# Patient Record
Sex: Female | Born: 1995 | Race: White | Hispanic: No | Marital: Married | Smoking: Former smoker
Health system: Southern US, Community
[De-identification: ages and names within clinical notes are randomized; demographics above are authoritative.]

## PROBLEM LIST (undated history)

## (undated) ENCOUNTER — Inpatient Hospital Stay: Payer: Self-pay

## (undated) DIAGNOSIS — K219 Gastro-esophageal reflux disease without esophagitis: Secondary | ICD-10-CM

## (undated) DIAGNOSIS — F32A Depression, unspecified: Secondary | ICD-10-CM

## (undated) DIAGNOSIS — K589 Irritable bowel syndrome without diarrhea: Secondary | ICD-10-CM

## (undated) DIAGNOSIS — E079 Disorder of thyroid, unspecified: Secondary | ICD-10-CM

## (undated) DIAGNOSIS — O133 Gestational [pregnancy-induced] hypertension without significant proteinuria, third trimester: Secondary | ICD-10-CM

## (undated) DIAGNOSIS — F329 Major depressive disorder, single episode, unspecified: Secondary | ICD-10-CM

## (undated) DIAGNOSIS — E0590 Thyrotoxicosis, unspecified without thyrotoxic crisis or storm: Secondary | ICD-10-CM

## (undated) DIAGNOSIS — F419 Anxiety disorder, unspecified: Secondary | ICD-10-CM

## (undated) HISTORY — DX: Gastro-esophageal reflux disease without esophagitis: K21.9

## (undated) HISTORY — PX: NO PAST SURGERIES: SHX2092

## (undated) HISTORY — DX: Gestational (pregnancy-induced) hypertension without significant proteinuria, third trimester: O13.3

## (undated) HISTORY — DX: Irritable bowel syndrome, unspecified: K58.9

## (undated) HISTORY — DX: Thyrotoxicosis, unspecified without thyrotoxic crisis or storm: E05.90

---
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
## 2015-01-02 ENCOUNTER — Observation Stay
Admission: EM | Admit: 2015-01-02 | Discharge: 2015-01-02 | Disposition: A | Discharge status: Home / Self Care | Payer: Commercial Managed Care - PPO | Attending: Obstetrics and Gynecology | Admitting: Obstetrics and Gynecology

## 2015-01-02 ENCOUNTER — Encounter: Payer: Self-pay | Admitting: *Deleted

## 2015-01-02 DIAGNOSIS — N939 Abnormal uterine and vaginal bleeding, unspecified: Secondary | ICD-10-CM | POA: Diagnosis present

## 2015-01-02 DIAGNOSIS — Z3A33 33 weeks gestation of pregnancy: Secondary | ICD-10-CM | POA: Diagnosis not present

## 2015-01-02 DIAGNOSIS — O468X3 Other antepartum hemorrhage, third trimester: Principal | ICD-10-CM | POA: Insufficient documentation

## 2015-01-02 NOTE — OB Triage Note (Signed)
AVS discharge instructions and labor/pregnancy precautions given to patient with no questions or concerns.   Debra Hinton  

## 2015-02-10 ENCOUNTER — Inpatient Hospital Stay: Payer: Commercial Managed Care - PPO | Admitting: Anesthesiology

## 2015-02-10 ENCOUNTER — Encounter: Payer: Self-pay | Admitting: Certified Nurse Midwife

## 2015-02-10 ENCOUNTER — Inpatient Hospital Stay
Admission: EM | Admit: 2015-02-10 | Discharge: 2015-02-12 | DRG: 982 | Disposition: A | Discharge status: Home / Self Care | Payer: Commercial Managed Care - PPO | Attending: Certified Nurse Midwife | Admitting: Certified Nurse Midwife

## 2015-02-10 DIAGNOSIS — Z6833 Body mass index (BMI) 33.0-33.9, adult: Secondary | ICD-10-CM | POA: Diagnosis not present

## 2015-02-10 DIAGNOSIS — E669 Obesity, unspecified: Secondary | ICD-10-CM | POA: Diagnosis present

## 2015-02-10 DIAGNOSIS — Z87891 Personal history of nicotine dependence: Secondary | ICD-10-CM

## 2015-02-10 DIAGNOSIS — K219 Gastro-esophageal reflux disease without esophagitis: Secondary | ICD-10-CM | POA: Diagnosis present

## 2015-02-10 DIAGNOSIS — O4292 Full-term premature rupture of membranes, unspecified as to length of time between rupture and onset of labor: Secondary | ICD-10-CM | POA: Diagnosis present

## 2015-02-10 DIAGNOSIS — O4290 Premature rupture of membranes, unspecified as to length of time between rupture and onset of labor, unspecified weeks of gestation: Secondary | ICD-10-CM | POA: Diagnosis present

## 2015-02-10 DIAGNOSIS — O99344 Other mental disorders complicating childbirth: Secondary | ICD-10-CM | POA: Diagnosis present

## 2015-02-10 DIAGNOSIS — F419 Anxiety disorder, unspecified: Secondary | ICD-10-CM | POA: Diagnosis present

## 2015-02-10 DIAGNOSIS — F329 Major depressive disorder, single episode, unspecified: Secondary | ICD-10-CM | POA: Diagnosis present

## 2015-02-10 DIAGNOSIS — O9962 Diseases of the digestive system complicating childbirth: Secondary | ICD-10-CM | POA: Diagnosis present

## 2015-02-10 DIAGNOSIS — F1290 Cannabis use, unspecified, uncomplicated: Secondary | ICD-10-CM | POA: Diagnosis present

## 2015-02-10 DIAGNOSIS — Z3A39 39 weeks gestation of pregnancy: Secondary | ICD-10-CM | POA: Diagnosis present

## 2015-02-10 DIAGNOSIS — O99214 Obesity complicating childbirth: Secondary | ICD-10-CM | POA: Diagnosis present

## 2015-02-10 DIAGNOSIS — O133 Gestational [pregnancy-induced] hypertension without significant proteinuria, third trimester: Secondary | ICD-10-CM | POA: Diagnosis present

## 2015-02-10 DIAGNOSIS — O99324 Drug use complicating childbirth: Secondary | ICD-10-CM | POA: Diagnosis present

## 2015-02-10 DIAGNOSIS — Z3490 Encounter for supervision of normal pregnancy, unspecified, unspecified trimester: Secondary | ICD-10-CM

## 2015-02-10 HISTORY — DX: Anxiety disorder, unspecified: F41.9

## 2015-02-10 HISTORY — DX: Major depressive disorder, single episode, unspecified: F32.9

## 2015-02-10 HISTORY — DX: Depression, unspecified: F32.A

## 2015-02-10 LAB — COMPREHENSIVE METABOLIC PANEL
ALBUMIN: 3 g/dL — AB (ref 3.5–5.0)
ALT: 10 U/L — ABNORMAL LOW (ref 14–54)
ANION GAP: 7 (ref 5–15)
AST: 17 U/L (ref 15–41)
Alkaline Phosphatase: 117 U/L (ref 38–126)
BILIRUBIN TOTAL: 0.6 mg/dL (ref 0.3–1.2)
BUN: 8 mg/dL (ref 6–20)
CO2: 21 mmol/L — ABNORMAL LOW (ref 22–32)
Calcium: 8.3 mg/dL — ABNORMAL LOW (ref 8.9–10.3)
Chloride: 109 mmol/L (ref 101–111)
Creatinine, Ser: 0.68 mg/dL (ref 0.44–1.00)
GFR calc Af Amer: >60 mL/min (ref >60)
GFR calc non Af Amer: >60 mL/min (ref >60)
GLUCOSE: 84 mg/dL (ref 65–99)
POTASSIUM: 3.7 mmol/L (ref 3.5–5.1)
SODIUM: 137 mmol/L (ref 135–145)
TOTAL PROTEIN: 6.2 g/dL — AB (ref 6.5–8.1)

## 2015-02-10 LAB — CBC
HCT: 36.9 % (ref 35.0–47.0)
HEMOGLOBIN: 12.1 g/dL (ref 12.0–16.0)
MCH: 28.1 pg (ref 26.0–34.0)
MCHC: 32.8 g/dL (ref 32.0–36.0)
MCV: 85.7 fL (ref 80.0–100.0)
Platelets: 220 10*3/uL (ref 150–440)
RBC: 4.3 MIL/uL (ref 3.80–5.20)
RDW: 13.6 % (ref 11.5–14.5)
WBC: 9.1 10*3/uL (ref 3.6–11.0)

## 2015-02-10 LAB — TYPE AND SCREEN
ABO/RH(D): O POS
ANTIBODY SCREEN: NEGATIVE

## 2015-02-10 LAB — URINE DRUG SCREEN, QUALITATIVE (ARMC ONLY)
Amphetamines, Ur Screen: NOT DETECTED
BARBITURATES, UR SCREEN: NOT DETECTED
Benzodiazepine, Ur Scrn: NOT DETECTED
CANNABINOID 50 NG, UR ~~LOC~~: NOT DETECTED
Cocaine Metabolite,Ur ~~LOC~~: NOT DETECTED
MDMA (ECSTASY) UR SCREEN: NOT DETECTED
Methadone Scn, Ur: NOT DETECTED
Opiate, Ur Screen: NOT DETECTED
Phencyclidine (PCP) Ur S: NOT DETECTED
TRICYCLIC, UR SCREEN: NOT DETECTED

## 2015-02-10 LAB — PROTEIN / CREATININE RATIO, URINE
Creatinine, Urine: 66 mg/dL
Protein Creatinine Ratio: 0.24 mg/mg{Cre} — ABNORMAL HIGH (ref 0.00–0.15)
Total Protein, Urine: 16 mg/dL

## 2015-02-10 LAB — CHLAMYDIA/NGC RT PCR (ARMC ONLY)
Chlamydia Tr: NOT DETECTED
N GONORRHOEAE: NOT DETECTED

## 2015-02-10 MED ORDER — BUTORPHANOL TARTRATE 1 MG/ML IJ SOLN
1.0000 mg | INTRAMUSCULAR | Status: DC | PRN
  Administered 2015-02-10: 2 mg via INTRAVENOUS
  Administered 2015-02-10: 1 mg via INTRAVENOUS
  Administered 2015-02-10 (×2): 2 mg via INTRAVENOUS
  Filled 2015-02-10 (×2): qty 2

## 2015-02-10 MED ORDER — OXYTOCIN BOLUS FROM INFUSION: 500.0000 mL | INTRAVENOUS | Status: DC

## 2015-02-10 MED ORDER — MISOPROSTOL 200 MCG PO TABS: 800.0000 ug | ORAL_TABLET | ORAL | Status: DC

## 2015-02-10 MED ORDER — ONDANSETRON HCL 4 MG/2ML IJ SOLN: 4.0000 mg | Freq: Four times a day (QID) | INTRAMUSCULAR | Status: DC | PRN

## 2015-02-10 MED ORDER — BUTORPHANOL TARTRATE 1 MG/ML IJ SOLN
INTRAMUSCULAR | Status: AC
  Administered 2015-02-10: 2 mg via INTRAVENOUS
  Filled 2015-02-10: qty 2

## 2015-02-10 MED ORDER — LACTATED RINGERS IV SOLN: 500.0000 mL | INTRAVENOUS | Status: DC | PRN

## 2015-02-10 MED ORDER — OXYTOCIN 40 UNITS IN LACTATED RINGERS INFUSION - SIMPLE MED
62.5000 mL/h | INTRAVENOUS | Status: DC
  Filled 2015-02-10: qty 1000

## 2015-02-10 MED ORDER — FENTANYL 2.5 MCG/ML W/ROPIVACAINE 0.2% IN NS 100 ML EPIDURAL INFUSION (ARMC-ANES)
EPIDURAL | Status: AC
  Administered 2015-02-11: 10 mL/h via EPIDURAL
  Filled 2015-02-10: qty 100

## 2015-02-10 MED ORDER — OXYTOCIN 40 UNITS IN LACTATED RINGERS INFUSION - SIMPLE MED
1.0000 m[IU]/min | INTRAVENOUS | Status: DC
  Administered 2015-02-10: 1 m[IU]/min via INTRAVENOUS

## 2015-02-10 MED ORDER — INFLUENZA VAC SPLIT QUAD 0.5 ML IM SUSY
0.5000 mL | PREFILLED_SYRINGE | INTRAMUSCULAR | Status: DC
  Filled 2015-02-10: qty 0.5

## 2015-02-10 MED ORDER — AMMONIA AROMATIC IN INHA: 0.3000 mL | Freq: Once | RESPIRATORY_TRACT | Status: DC | PRN

## 2015-02-10 MED ORDER — BUTORPHANOL TARTRATE 1 MG/ML IJ SOLN
INTRAMUSCULAR | Status: AC
  Administered 2015-02-10: 1 mg via INTRAVENOUS
  Filled 2015-02-10: qty 1

## 2015-02-10 MED ORDER — TERBUTALINE SULFATE 1 MG/ML IJ SOLN: 0.2500 mg | Freq: Once | INTRAMUSCULAR | Status: DC | PRN

## 2015-02-10 MED ORDER — LIDOCAINE HCL (PF) 1 % IJ SOLN
30.0000 mL | INTRAMUSCULAR | Status: DC | PRN
  Filled 2015-02-10: qty 30

## 2015-02-10 MED ORDER — LACTATED RINGERS IV SOLN
INTRAVENOUS | Status: DC
  Administered 2015-02-10: 15:00:00 via INTRAVENOUS

## 2015-02-10 NOTE — Progress Notes (Addendum)
S: Moaning with ctx pain. Stadol 3 hours ago helped to relieve pain but spaced out the contractions she was having. Contractions now becoming more frequent/ O: Filed Vitals:   02/10/15 1414 02/10/15 1429 02/10/15 1430 02/10/15 1624  BP: 131/79 120/85 120/85 128/79  Pulse: 67 75 75 73   Results for orders placed or performed during the hospital encounter of 02/10/15 (from the past 24 hour(s))  CBC     Status: None   Collection Time: 02/10/15  3:16 PM  Result Value Ref Range   WBC 9.1 3.6 - 11.0 K/uL   RBC 4.30 3.80 - 5.20 MIL/uL   Hemoglobin 12.1 12.0 - 16.0 g/dL   HCT 16.1 09.6 - 04.5 %   MCV 85.7 80.0 - 100.0 fL   MCH 28.1 26.0 - 34.0 pg   MCHC 32.8 32.0 - 36.0 g/dL   RDW 40.9 81.1 - 91.4 %   Platelets 220 150 - 440 K/uL  Type and screen     Status: None   Collection Time: 02/10/15  3:16 PM  Result Value Ref Range   ABO/RH(D) O POS    Antibody Screen NEG    Sample Expiration 02/13/2015   Comprehensive metabolic panel     Status: Abnormal   Collection Time: 02/10/15  3:16 PM  Result Value Ref Range   Sodium 137 135 - 145 mmol/L   Potassium 3.7 3.5 - 5.1 mmol/L   Chloride 109 101 - 111 mmol/L   CO2 21 (L) 22 - 32 mmol/L   Glucose, Bld 84 65 - 99 mg/dL   BUN 8 6 - 20 mg/dL   Creatinine, Ser 7.82 0.44 - 1.00 mg/dL   Calcium 8.3 (L) 8.9 - 10.3 mg/dL   Total Protein 6.2 (L) 6.5 - 8.1 g/dL   Albumin 3.0 (L) 3.5 - 5.0 g/dL   AST 17 15 - 41 U/L   ALT 10 (L) 14 - 54 U/L   Alkaline Phosphatase 117 38 - 126 U/L   Total Bilirubin 0.6 0.3 - 1.2 mg/dL   GFR calc non Af Amer >60 >60 mL/min   GFR calc Af Amer >60 >60 mL/min   Anion gap 7 5 - 15  Chlamydia/NGC rt PCR ([facility] only)     Status: None   Collection Time: 02/10/15  3:26 PM  Result Value Ref Range   Specimen source GC/Chlam URINE, RANDOM    Chlamydia Tr NOT DETECTED NOT DETECTED   N gonorrhoeae NOT DETECTED NOT DETECTED  Urine Drug Screen, Qualitative ([facility] only)     Status: None   Collection Time: 02/10/15  3:26 PM   Result Value Ref Range   Tricyclic, Ur Screen NONE DETECTED NONE DETECTED   Amphetamines, Ur Screen NONE DETECTED NONE DETECTED   MDMA (Ecstasy)Ur Screen NONE DETECTED NONE DETECTED   Cocaine Metabolite,Ur [place] NONE DETECTED NONE DETECTED   Opiate, Ur Screen NONE DETECTED NONE DETECTED   Phencyclidine (PCP) Ur S NONE DETECTED NONE DETECTED   Cannabinoid 50 Ng, Ur [place] NONE DETECTED NONE DETECTED   Barbiturates, Ur Screen NONE DETECTED NONE DETECTED   Benzodiazepine, Ur Scrn NONE DETECTED NONE DETECTED   Methadone Scn, Ur NONE DETECTED NONE DETECTED    Gen: NAD, Sleepy from Stadol. Shaky when helped OOB to BR.             FHT: 130s with accels to 150s, mod var and  no decelerations SVE:2/75%/-1 Not picking up ctxs with toco very well. IUPC inserted. Contractions q4-5 minutes with coupling, most  contractions less than 50 mm Hg.  A/P:  19 y.o. yo G1P0 at [redacted]w[redacted]d with PROM x 8 hours with inadequate labor-Will start Pitocin augmentation. Plan Stadol for pain until more actively progressing then epidural.  FWB: Reassuring Cat 1 tracing. EFW 7#  Some elevated blood pressures initially...normotensive especially after Stadol  PIH labs WNL. Will get pro/cr ratio also   Debra Hinton, Debra Hinton 6:12 PM

## 2015-02-10 NOTE — H&P (Signed)
OB History & Physical   History of Present Illness:  Chief Complaint:  My bag of water broke at 1005 this Am. Some back pain, but not many contractions HPI:  Debra Hinton is a 19 y.o. G1P0 female at [redacted]w[redacted]d dated by LMP and c/w a 9wk ultrasound  Her pregnancy has been complicated by marijuana use in pregnancy, a history of anxiety and depression (not on medications), and obesity (BMI=33) with only a net 33 weight gain with pregnancy..  She presents to L&D for evaluation of PROM.   Prenatal care site: Prenatal care at [facility].       Maternal Medical History:   Past Medical History  Diagnosis Date  . Anxiety   . Depressive disorder     Past Surgical History  Procedure Laterality Date  . No past surgeries      No Known Allergies  Prior to Admission medications   Medication Sig Start Date End Date Taking? Authorizing Provider  cyclobenzaprine (FLEXERIL) 10 MG tablet Take 10 mg by mouth 3 (three) times daily as needed for muscle spasms.    Historical Provider, MD  prenatal vitamin w/FE, FA (NATACHEW) 29-1 MG CHEW chewable tablet Chew 1 tablet by mouth daily at 12 noon.    Historical Provider, MD   Has not taken Flexeril in over a month.       Social History: She  reports that she has quit smoking. She has never used smokeless tobacco. She reports that she uses illicit drugs (Marijuana). She reports that she does not drink alcohol.  Family History: family history includes Breast cancer in an other family member.   Review of Systems: Negative x 10 systems reviewed except as noted in the HPI.      Physical Exam:  Vital Signs: 126/99, 134/85. Pulse 56 General: no acute distress.  HEENT: normocephalic, atraumatic Heart: regular rate & rhythm.  No murmurs Lungs: clear to auscultation bilaterally Abdomen: soft, gravid, non-tender;  EFW: 7# Pelvic:   External: Normal external female genitalia  Cervix: 1/70%/-1 per RN exam    ROM:+ Nitrazine of clear  fluid Extremities: non-tender, symmetric, tr edema bilaterally.  DTRs:+1 Neurologic: Alert & oriented x 3.    Pertinent Results:  Prenatal Labs: Blood type/Rh O positive  Antibody screen negative  Rubella Varicella Immune immune  RPR negative  HBsAg negative  HIV negative  GC negative  Chlamydia negative  Genetic screening Negative Panorama  1 hour GTT 97  3 hour GTT NA  GBS negative} on 8/29   Baseline FHR: 140s with accelerations to 180s  With moderate variability Toco: initially uterine irritability, then irregular, mild  Bedside Ultrasound: cephalic (OT)     Assessment:  Debra Hinton is a 19 y.o. G1P0 female at [redacted]w[redacted]d with PROM clear fluid  -mildly elevated blood pressures Plan:  1. Admit to Labor & Delivery    2. CBC, T&S, CMP regular diet for lunch then clears, IVF 3. GBS negative  4. Consents obtained. 5. Reevaluate contraction pattern after lunch and start either Cytotec po or Pitocin IV if not contracting well. 6. Can ambulate and monitor intermittently.   Farrel Conners  02/10/2015 12:40 PM

## 2015-02-11 ENCOUNTER — Encounter: Payer: Self-pay | Admitting: *Deleted

## 2015-02-11 LAB — RPR: RPR: NONREACTIVE

## 2015-02-11 LAB — ABO/RH: ABO/RH(D): O POS

## 2015-02-11 MED ORDER — FERROUS SULFATE 325 (65 FE) MG PO TABS
325.0000 mg | ORAL_TABLET | Freq: Every day | ORAL | Status: DC
  Administered 2015-02-12: 325 mg via ORAL
  Filled 2015-02-11: qty 1

## 2015-02-11 MED ORDER — DIPHENHYDRAMINE HCL 50 MG/ML IJ SOLN: 12.5000 mg | INTRAMUSCULAR | Status: DC | PRN

## 2015-02-11 MED ORDER — DIBUCAINE 1 % RE OINT: 1.0000 "application " | TOPICAL_OINTMENT | RECTAL | Status: DC | PRN

## 2015-02-11 MED ORDER — OXYTOCIN 40 UNITS IN LACTATED RINGERS INFUSION - SIMPLE MED: 62.5000 mL/h | INTRAVENOUS | Status: DC | PRN

## 2015-02-11 MED ORDER — NALOXONE HCL 1 MG/ML IJ SOLN: 1.0000 ug/kg/h | INTRAVENOUS | Status: DC | PRN

## 2015-02-11 MED ORDER — HYDROCODONE-ACETAMINOPHEN 5-325 MG PO TABS
1.0000 | ORAL_TABLET | ORAL | Status: DC | PRN
  Administered 2015-02-11 – 2015-02-12 (×5): 2 via ORAL
  Filled 2015-02-11: qty 2
  Filled 2015-02-11: qty 1
  Filled 2015-02-11 (×2): qty 2
  Filled 2015-02-11: qty 1

## 2015-02-11 MED ORDER — WITCH HAZEL-GLYCERIN EX PADS: 1.0000 "application " | MEDICATED_PAD | CUTANEOUS | Status: DC | PRN

## 2015-02-11 MED ORDER — LIDOCAINE-EPINEPHRINE (PF) 1.5 %-1:200000 IJ SOLN
INTRAMUSCULAR | Status: DC | PRN
  Administered 2015-02-11: 3 mL via PERINEURAL

## 2015-02-11 MED ORDER — PRENATAL MULTIVITAMIN CH
1.0000 | ORAL_TABLET | Freq: Every day | ORAL | Status: DC
  Administered 2015-02-12: 1 via ORAL
  Filled 2015-02-11: qty 1

## 2015-02-11 MED ORDER — LANOLIN HYDROUS EX OINT: TOPICAL_OINTMENT | CUTANEOUS | Status: DC | PRN

## 2015-02-11 MED ORDER — BUPIVACAINE HCL (PF) 0.25 % IJ SOLN
INTRAMUSCULAR | Status: DC | PRN
  Administered 2015-02-11: 5 mL via EPIDURAL

## 2015-02-11 MED ORDER — FENTANYL 2.5 MCG/ML W/ROPIVACAINE 0.2% IN NS 100 ML EPIDURAL INFUSION (ARMC-ANES): 10.0000 mL/h | EPIDURAL | Status: DC

## 2015-02-11 MED ORDER — ONDANSETRON HCL 4 MG PO TABS: 4.0000 mg | ORAL_TABLET | ORAL | Status: DC | PRN

## 2015-02-11 MED ORDER — DOCUSATE SODIUM 100 MG PO CAPS
100.0000 mg | ORAL_CAPSULE | Freq: Two times a day (BID) | ORAL | Status: DC
  Administered 2015-02-11 – 2015-02-12 (×2): 100 mg via ORAL
  Filled 2015-02-11 (×2): qty 1

## 2015-02-11 MED ORDER — SODIUM CHLORIDE 0.9 % IJ SOLN: 3.0000 mL | INTRAMUSCULAR | Status: DC | PRN

## 2015-02-11 MED ORDER — NALOXONE HCL 0.4 MG/ML IJ SOLN: 0.4000 mg | INTRAMUSCULAR | Status: DC | PRN

## 2015-02-11 MED ORDER — NALBUPHINE HCL 10 MG/ML IJ SOLN
5.0000 mg | Freq: Once | INTRAMUSCULAR | Status: DC | PRN
  Filled 2015-02-11: qty 0.5

## 2015-02-11 MED ORDER — LIDOCAINE HCL (PF) 1 % IJ SOLN
INTRAMUSCULAR | Status: DC | PRN
  Administered 2015-02-10: 1 mL via INTRADERMAL

## 2015-02-11 MED ORDER — ONDANSETRON HCL 4 MG/2ML IJ SOLN
4.0000 mg | Freq: Three times a day (TID) | INTRAMUSCULAR | Status: DC | PRN
  Administered 2015-02-11: 4 mg via INTRAVENOUS
  Filled 2015-02-11: qty 2

## 2015-02-11 MED ORDER — BENZOCAINE-MENTHOL 20-0.5 % EX AERO: 1.0000 "application " | INHALATION_SPRAY | CUTANEOUS | Status: DC | PRN

## 2015-02-11 MED ORDER — SIMETHICONE 80 MG PO CHEW: 80.0000 mg | CHEWABLE_TABLET | ORAL | Status: DC | PRN

## 2015-02-11 MED ORDER — ONDANSETRON HCL 4 MG/2ML IJ SOLN: 4.0000 mg | INTRAMUSCULAR | Status: DC | PRN

## 2015-02-11 MED ORDER — MEPERIDINE HCL 25 MG/ML IJ SOLN: 6.2500 mg | INTRAMUSCULAR | Status: DC | PRN

## 2015-02-11 MED ORDER — DIPHENHYDRAMINE HCL 25 MG PO CAPS: 25.0000 mg | ORAL_CAPSULE | ORAL | Status: DC | PRN

## 2015-02-11 MED ORDER — NALBUPHINE HCL 10 MG/ML IJ SOLN
5.0000 mg | INTRAMUSCULAR | Status: DC | PRN
  Filled 2015-02-11: qty 0.5

## 2015-02-11 MED ORDER — IBUPROFEN 600 MG PO TABS
600.0000 mg | ORAL_TABLET | Freq: Four times a day (QID) | ORAL | Status: DC | PRN
  Administered 2015-02-11 – 2015-02-12 (×3): 600 mg via ORAL
  Filled 2015-02-11 (×3): qty 1

## 2015-02-11 MED ORDER — HYDROCODONE-ACETAMINOPHEN 5-325 MG PO TABS
ORAL_TABLET | ORAL | Status: AC
  Filled 2015-02-11: qty 2

## 2015-02-11 MED ORDER — SCOPOLAMINE 1 MG/3DAYS TD PT72
1.0000 | MEDICATED_PATCH | Freq: Once | TRANSDERMAL | Status: DC
  Filled 2015-02-11: qty 1

## 2015-02-11 NOTE — Anesthesia Preprocedure Evaluation (Addendum)
Anesthesia Evaluation  Patient identified by MRN, date of birth, ID band Patient awake    Reviewed: Allergy & Precautions, H&P , NPO status , Patient's Chart, lab work & pertinent test results  History of Anesthesia Complications Negative for: history of anesthetic complications  Airway Mallampati: II  TM Distance: >3 FB Neck ROM: full    Dental no notable dental hx. (+) Teeth Intact   Pulmonary former smoker,    Pulmonary exam normal breath sounds clear to auscultation       Cardiovascular Exercise Tolerance: Good hypertension, (-) Past MI Normal cardiovascular exam Rhythm:regular Rate:Normal     Neuro/Psych negative neurological ROS  negative psych ROS   GI/Hepatic Neg liver ROS, GERD  Controlled,  Endo/Other  negative endocrine ROS  Renal/GU negative Renal ROS  negative genitourinary   Musculoskeletal   Abdominal   Peds  Hematology negative hematology ROS (+)   Anesthesia Other Findings Past Medical History:   Anxiety                                                      Depressive disorder                                          Reproductive/Obstetrics (+) Pregnancy                            Anesthesia Physical Anesthesia Plan  ASA: III  Anesthesia Plan: Epidural   Post-op Pain Management:    Induction:   Airway Management Planned:   Additional Equipment:   Intra-op Plan:   Post-operative Plan:   Informed Consent: I have reviewed the patients History and Physical, chart, labs and discussed the procedure including the risks, benefits and alternatives for the proposed anesthesia with the patient or authorized representative who has indicated his/her understanding and acceptance.   Dental Advisory Given  Plan Discussed with: Anesthesiologist, CRNA and Surgeon  Anesthesia Plan Comments:        Anesthesia Quick Evaluation

## 2015-02-11 NOTE — Discharge Summary (Signed)
Physician Obstetric Discharge Summary  Patient ID: Debra Hinton MRN: [id number] DOB/AGE: Oct 07, 1995 19 y.o.   Date of Admission: 02/10/2015  Date of Discharge: 02/12/2015  Admitting Diagnosis: Premature rupture of membrane at [redacted]w[redacted]d  Secondary Diagnosis: Gestational hypertension  Mode of Delivery: normal spontaneous vaginal delivery on 02/11/2015       Discharge Diagnosis: No other diagnosis   Intrapartum Procedures: pitocin augmentation and intrauterine pressure catheter and repair of vaginal and right periurethral lacerations   Post partum procedures: none  Complications: none   Brief Hospital Course  Debra Hinton is a G1P1001 who had a SVD on 02/11/2015;  for further details of this delivery, please refer to the delivery note.  Patient had an uncomplicated postpartum course.  By time of discharge on PPD#1, her pain was controlled on oral pain medications; she had appropriate lochia and was ambulating, voiding without difficulty and tolerating regular diet.  She was deemed stable for discharge to home.    Labs: CBC Latest Ref Rng 02/12/2015 02/10/2015 09/21/2014  WBC 3.6 - 11.0 K/uL 12.5(H) 9.1 9.3  Hemoglobin 12.0 - 16.0 g/dL 10.7(L) 12.1 14.2  Hematocrit 35.0 - 47.0 % 31.6(L) 36.9 41.6  Platelets 150 - 440 K/uL 164 220 211   O POS/ RI/ VI/ GBS negative  Physical exam:  Blood pressure 120/66, pulse 74, temperature 97.8 F (36.6 C), temperature source Oral, resp. rate 18, height 5' (1.524 m), weight 79.379 kg (175 lb), last menstrual period 05/13/2014, SpO2 100 %, unknown if currently breastfeeding. General: alert and no distress Lochia: appropriate Abdomen: soft, NT Uterine Fundus: firm Extremities: No evidence of DVT seen on physical exam. No lower extremity edema.  Discharge Instructions: Per After Visit Summary. Activity: Advance as tolerated. Pelvic rest for 6 weeks.  Also refer to Discharge Instructions Diet: Regular Medications:   Medication List    STOP  taking these medications        cyclobenzaprine 10 MG tablet  Commonly known as:  FLEXERIL      TAKE these medications        ferrous sulfate 325 (65 FE) MG tablet  Take 1 tablet (325 mg total) by mouth daily with breakfast.     ibuprofen 600 MG tablet  Commonly known as:  ADVIL,MOTRIN  Take 1 tablet (600 mg total) by mouth every 6 (six) hours as needed for mild pain, moderate pain or cramping.     norethindrone 0.35 MG tablet  Commonly known as:  MICRONOR,CAMILA,ERRIN  Take 1 tablet (0.35 mg total) by mouth daily.     prenatal vitamin w/FE, FA 29-1 MG Chew chewable tablet  Chew 1 tablet by mouth daily at 12 noon.       Outpatient follow up:  Follow-up Information    Follow up with GUTIERREZ, COLLEEN, CNM. Schedule an appointment as soon as possible for a visit in 6 weeks.   Specialty:  Certified Nurse Midwife   Why:  postpartum check   Contact information:   [address] [place] [address] [phone]      Postpartum contraception: oral progesterone-only contraceptive  Discharged Condition: good  Discharged to: home   Newborn Data: Disposition:home with mother  Apgars: APGAR (1 MIN): 7   APGAR (5 MINS): 9   APGAR (10 MINS):    Baby Feeding: Breast/ Debra Hinton, Debra Ou, MD 02/12/2015 9:14 AM

## 2015-02-11 NOTE — Anesthesia Procedure Notes (Addendum)
Epidural Patient location during procedure: OB Start time: 02/10/2015 11:55 PM End time: 02/10/2015 11:59 PM  Staffing Anesthesiologist: Margorie John K Performed by: anesthesiologist   Preanesthetic Checklist Completed: patient identified, site marked, surgical consent, pre-op evaluation, timeout performed, IV checked, risks and benefits discussed and monitors and equipment checked  Epidural Patient position: sitting Prep: Betadine Patient monitoring: heart rate, continuous pulse ox and blood pressure Approach: midline Location: L4-L5 Injection technique: LOR saline  Needle:  Needle type: Tuohy  Needle gauge: 18 G Needle length: 9 cm and 9 Needle insertion depth: 5 cm Catheter type: closed end flexible Catheter size: 20 Guage Catheter at skin depth: 10 cm Test dose: negative and 1.5% lidocaine with Epi 1:200 K  Assessment Sensory level: T10 Events: blood not aspirated, injection not painful, no injection resistance, negative IV test and no paresthesia  Additional Notes   Patient tolerated the insertion well without complications.Reason for block:procedure for pain

## 2015-02-11 NOTE — Progress Notes (Signed)
L&D note  S: Stadol helping pain, ready for epidural  O: 132/93, 111/83, 134/97 Protein/cr=240 mgm protein Cervix: 3/C/-1 FHR: 125-130 with moderate variability and accelerations to 150s Toco: contractions q1-2 min apart on 4 miu/min Pitocin, 200 mvus IUPC replaced (first one falling out)  A: progressing Cat 1 tracing Gestational hypertension  P: Consult anesthesia for epidural Continue to titrate pitocin to get/keep 200 mvu  GUTIERREZ, COLLEEN, CNM

## 2015-02-12 LAB — CBC
HEMATOCRIT: 31.6 % — AB (ref 35.0–47.0)
HEMOGLOBIN: 10.7 g/dL — AB (ref 12.0–16.0)
MCH: 29.3 pg (ref 26.0–34.0)
MCHC: 33.9 g/dL (ref 32.0–36.0)
MCV: 86.4 fL (ref 80.0–100.0)
Platelets: 164 10*3/uL (ref 150–440)
RBC: 3.65 MIL/uL — ABNORMAL LOW (ref 3.80–5.20)
RDW: 14.1 % (ref 11.5–14.5)
WBC: 12.5 10*3/uL — AB (ref 3.6–11.0)

## 2015-02-12 MED ORDER — FERROUS SULFATE 325 (65 FE) MG PO TABS: 325.0000 mg | ORAL_TABLET | Freq: Every day | ORAL | Status: DC

## 2015-02-12 MED ORDER — IBUPROFEN 600 MG PO TABS: 600.0000 mg | ORAL_TABLET | Freq: Four times a day (QID) | ORAL | Status: DC | PRN

## 2015-02-12 MED ORDER — NORETHINDRONE 0.35 MG PO TABS: 1.0000 | ORAL_TABLET | Freq: Every day | ORAL | Status: DC

## 2015-02-12 NOTE — Lactation Note (Signed)
This note was copied from the chart of Debra Laterria [place]. Lactation Consultation Note  Patient Name: Debra Hinton [id number] Date: 02/12/2015 Reason for consult: Initial assessment   Maternal Data    Feeding Feeding Type: Breast Fed  Yesterday, Mom declined LC assist saying baby was feeding well. Today she says she wanted a nipple shield yesterday due to latch issues. There is a little bruising on face of nipples, no cracks or bleeding. Baby's mouth, tongue, palate, WNL. Mom has flat nipples but compressible areola. I reviewed options after assisting with position and latch. Mom chose to try nipple shield. 24 mm worked best. Pecola Leisure did latch better with shield and we immediately heard a lot of swallows. Mom states this is the best feeding yet. I still need to see Mom apply shield herself and latch baby correctly on to breast before going home. Weaning off shield after a few days discussed. Encouraged ehr to Follow up with LC at MDs office or here within the the week.   LATCH Score/Interventions Latch: Grasps breast easily, tongue down, lips flanged, rhythmical sucking. (only wiht nipple shield)  Audible Swallowing: Spontaneous and intermittent  Type of Nipple: Flat Intervention(s): Hand pump  Comfort (Breast/Nipple): Filling, red/small blisters or bruises, mild/mod discomfort  Problem noted: Mild/Moderate discomfort Interventions (Mild/moderate discomfort): Comfort gels  Hold (Positioning): No assistance needed to correctly position infant at breast.  LATCH Score: 8  Lactation Tools Discussed/Used Nipple shield size: 24   Consult Status      Sunday Corn 02/12/2015, 12:18 PM

## 2015-02-12 NOTE — Discharge Instructions (Signed)

## 2015-02-12 NOTE — Progress Notes (Signed)
Discharge instructions reviewed with pt. Pt v/u of all instructions. Prescriptions given to pt. ID bands of mom and infant matched. Escorted by nursing via w/c in stable condition. Ruta Hinds, RN 02/12/15 [phone]

## 2015-02-13 NOTE — Anesthesia Postprocedure Evaluation (Signed)
  Anesthesia Post-op Note  Patient: Debra Hinton  Procedure(s) Performed: * No procedures listed *  Anesthesia type:No value filed.  Patient location: PACU  Post pain: Pain level controlled  Post assessment: Post-op Vital signs reviewed, Patient's Cardiovascular Status Stable, Respiratory Function Stable, Patent Airway and No signs of Nausea or vomiting  Post vital signs: Reviewed and stable  Last Vitals: There were no vitals filed for this visit.  Level of consciousness: awake, alert  and patient cooperative  Complications: No apparent anesthesia complications

## 2015-08-19 ENCOUNTER — Emergency Department
Admission: EM | Admit: 2015-08-19 | Discharge: 2015-08-20 | Disposition: A | Discharge status: Home / Self Care | Payer: Commercial Managed Care - PPO | Attending: Emergency Medicine | Admitting: Emergency Medicine

## 2015-08-19 ENCOUNTER — Encounter: Payer: Self-pay | Admitting: Emergency Medicine

## 2015-08-19 ENCOUNTER — Emergency Department: Payer: Commercial Managed Care - PPO

## 2015-08-19 DIAGNOSIS — R05 Cough: Secondary | ICD-10-CM | POA: Diagnosis not present

## 2015-08-19 DIAGNOSIS — Z79899 Other long term (current) drug therapy: Secondary | ICD-10-CM | POA: Diagnosis not present

## 2015-08-19 DIAGNOSIS — R109 Unspecified abdominal pain: Secondary | ICD-10-CM | POA: Diagnosis not present

## 2015-08-19 DIAGNOSIS — R079 Chest pain, unspecified: Secondary | ICD-10-CM | POA: Insufficient documentation

## 2015-08-19 DIAGNOSIS — Z87891 Personal history of nicotine dependence: Secondary | ICD-10-CM | POA: Diagnosis not present

## 2015-08-19 DIAGNOSIS — Z3202 Encounter for pregnancy test, result negative: Secondary | ICD-10-CM | POA: Insufficient documentation

## 2015-08-19 DIAGNOSIS — R197 Diarrhea, unspecified: Secondary | ICD-10-CM | POA: Diagnosis not present

## 2015-08-19 DIAGNOSIS — R1110 Vomiting, unspecified: Secondary | ICD-10-CM

## 2015-08-19 DIAGNOSIS — R059 Cough, unspecified: Secondary | ICD-10-CM

## 2015-08-19 LAB — URINALYSIS COMPLETE WITH MICROSCOPIC (ARMC ONLY)
Bacteria, UA: NONE SEEN
Bilirubin Urine: NEGATIVE
Glucose, UA: NEGATIVE mg/dL
Hgb urine dipstick: NEGATIVE
Ketones, ur: NEGATIVE mg/dL
Leukocytes, UA: NEGATIVE
Nitrite: NEGATIVE
Protein, ur: 30 mg/dL — AB
RBC / HPF: NONE SEEN RBC/hpf (ref 0–5)
Specific Gravity, Urine: 1.031 — ABNORMAL HIGH (ref 1.005–1.030)
pH: 5 (ref 5.0–8.0)

## 2015-08-19 LAB — COMPREHENSIVE METABOLIC PANEL
ALT: 22 U/L (ref 14–54)
ANION GAP: 10 (ref 5–15)
AST: 50 U/L — AB (ref 15–41)
Albumin: 4.2 g/dL (ref 3.5–5.0)
Alkaline Phosphatase: 78 U/L (ref 38–126)
BUN: 12 mg/dL (ref 6–20)
CHLORIDE: 101 mmol/L (ref 101–111)
CO2: 23 mmol/L (ref 22–32)
Calcium: 8.9 mg/dL (ref 8.9–10.3)
Creatinine, Ser: 0.86 mg/dL (ref 0.44–1.00)
Glucose, Bld: 108 mg/dL — ABNORMAL HIGH (ref 65–99)
POTASSIUM: 2.8 mmol/L — AB (ref 3.5–5.1)
Sodium: 134 mmol/L — ABNORMAL LOW (ref 135–145)
Total Bilirubin: 0.9 mg/dL (ref 0.3–1.2)
Total Protein: 7.5 g/dL (ref 6.5–8.1)

## 2015-08-19 LAB — CBC
HCT: 39.7 % (ref 35.0–47.0)
Hemoglobin: 13.3 g/dL (ref 12.0–16.0)
MCH: 25.5 pg — ABNORMAL LOW (ref 26.0–34.0)
MCHC: 33.6 g/dL (ref 32.0–36.0)
MCV: 75.9 fL — ABNORMAL LOW (ref 80.0–100.0)
Platelets: 209 K/uL (ref 150–440)
RBC: 5.23 MIL/uL — ABNORMAL HIGH (ref 3.80–5.20)
RDW: 17.2 % — ABNORMAL HIGH (ref 11.5–14.5)
WBC: 4.9 K/uL (ref 3.6–11.0)

## 2015-08-19 LAB — POCT PREGNANCY, URINE: Preg Test, Ur: NEGATIVE

## 2015-08-19 LAB — LIPASE, BLOOD: LIPASE: 15 U/L (ref 11–51)

## 2015-08-19 MED ORDER — SODIUM CHLORIDE 0.9 % IV BOLUS (SEPSIS)
1000.0000 mL | Freq: Once | INTRAVENOUS | Status: AC
  Administered 2015-08-19: 1000 mL via INTRAVENOUS

## 2015-08-19 MED ORDER — ONDANSETRON HCL 4 MG/2ML IJ SOLN
4.0000 mg | Freq: Once | INTRAMUSCULAR | Status: AC
  Administered 2015-08-19: 4 mg via INTRAVENOUS
  Filled 2015-08-19: qty 2

## 2015-08-19 MED ORDER — POTASSIUM CHLORIDE CRYS ER 20 MEQ PO TBCR
40.0000 meq | EXTENDED_RELEASE_TABLET | Freq: Once | ORAL | Status: AC
  Administered 2015-08-19: 40 meq via ORAL
  Filled 2015-08-19: qty 2

## 2015-08-19 MED ORDER — HYDROCOD POLST-CPM POLST ER 10-8 MG/5ML PO SUER
5.0000 mL | Freq: Once | ORAL | Status: AC
  Administered 2015-08-19: 5 mL via ORAL
  Filled 2015-08-19: qty 5

## 2015-08-19 NOTE — ED Provider Notes (Signed)
[facility] Emergency Department Provider Note  ____________________________________________  Time seen: Approximately 11:11 PM  I have reviewed the triage vital signs and the nursing notes.   HISTORY  Chief Complaint Emesis; Diarrhea; and Cough    HPI Debra Hinton is a 20 y.o. female who comes into the hospital today with vomiting and diarrhea for the past 2 days as well as a cough that hurts her chest. The patient reports that when she stands she feels that she is been a passed out and when she coughs her chest hurts and she feels that she cannot breathe. The patient reports that she's vomited more than 10 times a day in the last couple of days and is currently just foamy. She also reports that she's had a similar amount of brown liquid diarrhea. She reports that she can't keep down water. She's had no fevers and no known sick contacts. She reports that she has had some upper abdominal pain but is gone at this time. She reports that the pain in her chest is a 6-7 out of 10 in intensity and is worse when she is coughing and when she touches it. The patient reports that she didn't know what was going on so she decided to come in and get checked out.   Past Medical History  Diagnosis Date  . Anxiety   . Depressive disorder     Patient Active Problem List   Diagnosis Date Noted  . Pregnancy 02/10/2015  . Premature rupture of membranes 02/10/2015  . Indication for care in labor or delivery 02/10/2015  . Labor and delivery, indication for care 01/02/2015    Past Surgical History  Procedure Laterality Date  . No past surgeries      Current Outpatient Rx  Name  Route  Sig  Dispense  Refill  . chlorpheniramine-HYDROcodone (TUSSIONEX PENNKINETIC ER) 10-8 MG/5ML SUER   Oral   Take 5 mLs by mouth 2 (two) times daily.   140 mL   0   . ferrous sulfate 325 (65 FE) MG tablet   Oral   Take 1 tablet (325 mg total) by mouth daily with breakfast.   30  tablet   3   . ibuprofen (ADVIL,MOTRIN) 600 MG tablet   Oral   Take 1 tablet (600 mg total) by mouth every 6 (six) hours as needed for mild pain, moderate pain or cramping.   30 tablet   0   . norethindrone (MICRONOR,CAMILA,ERRIN) 0.35 MG tablet   Oral   Take 1 tablet (0.35 mg total) by mouth daily.   1 Package   11   . ondansetron (ZOFRAN ODT) 4 MG disintegrating tablet   Oral   Take 1 tablet (4 mg total) by mouth every 8 (eight) hours as needed for nausea or vomiting.   20 tablet   0   . prenatal vitamin w/FE, FA (NATACHEW) 29-1 MG CHEW chewable tablet   Oral   Chew 1 tablet by mouth daily at 12 noon.           Allergies Review of patient's allergies indicates no known allergies.  Family History  Problem Relation Age of Onset  . Breast cancer      mat aunt, mggm    Social History Social History  Substance Use Topics  . Smoking status: Former Games developermoker  . Smokeless tobacco: Never Used  . Alcohol Use: No    Review of Systems Constitutional: No fever/chills Eyes: No visual changes. ENT: No sore throat.  Cardiovascular:  chest pain. Respiratory: Cough and shortness of breath. Gastrointestinal:  abdominal pain,  nausea, vomiting and diarrhea.  No constipation. Genitourinary: Negative for dysuria. Musculoskeletal: Negative for back pain. Skin: Negative for rash. Neurological: Negative for headaches, focal weakness or numbness.  10-point ROS otherwise negative.  ____________________________________________   PHYSICAL EXAM:  VITAL SIGNS: ED Triage Vitals  Enc Vitals Group     BP 08/19/15 2109 111/76 mmHg     Pulse Rate 08/19/15 2109 108     Resp 08/19/15 2109 18     Temp 08/19/15 2109 98.7 F (37.1 C)     Temp Source 08/19/15 2109 Oral     SpO2 08/19/15 2109 98 %     Weight 08/19/15 2109 172 lb (78.019 kg)     Height 08/19/15 2109 5' (1.524 m)     Head Cir --      Peak Flow --      Pain Score 08/19/15 2110 7     Pain Loc --      Pain Edu? --       Excl. in GC? --     Constitutional: Alert and oriented. Well appearing and in Mild distress. Eyes: Conjunctivae are normal. PERRL. EOMI. Head: Atraumatic. Nose: No congestion/rhinnorhea. Mouth/Throat: Mucous membranes are moist.  Oropharynx non-erythematous. Cardiovascular: Normal rate, regular rhythm. Grossly normal heart sounds.  Good peripheral circulation. Respiratory: Normal respiratory effort.  No retractions. Lungs CTAB. Chest tender to palpation Gastrointestinal: Soft and nontender. No distention. Positive bowel sounds Musculoskeletal: No lower extremity tenderness nor edema.   Neurologic:  Normal speech and language.  Skin:  Skin is warm, dry and intact.  Psychiatric: Mood and affect are normal.  ____________________________________________   LABS (all labs ordered are listed, but only abnormal results are displayed)  Labs Reviewed  COMPREHENSIVE METABOLIC PANEL - Abnormal; Notable for the following:    Sodium 134 (*)    Potassium 2.8 (*)    Glucose, Bld 108 (*)    AST 50 (*)    All other components within normal limits  CBC - Abnormal; Notable for the following:    RBC 5.23 (*)    MCV 75.9 (*)    MCH 25.5 (*)    RDW 17.2 (*)    All other components within normal limits  URINALYSIS COMPLETEWITH MICROSCOPIC ([facility] ONLY) - Abnormal; Notable for the following:    Color, Urine YELLOW (*)    APPearance HAZY (*)    Specific Gravity, Urine 1.031 (*)    Protein, ur 30 (*)    Squamous Epithelial / LPF 6-30 (*)    All other components within normal limits  LIPASE, BLOOD  POCT PREGNANCY, URINE   ____________________________________________  EKG  None ____________________________________________  RADIOLOGY  Chest x-ray: No acute cardiopulmonary process seen ____________________________________________   PROCEDURES  Procedure(s) performed: None  Critical Care performed: No  ____________________________________________   INITIAL IMPRESSION / ASSESSMENT  AND PLAN / ED COURSE  Pertinent labs & imaging results that were available during my care of the patient were reviewed by me and considered in my medical decision making (see chart for details).  This is a 20 year old female who comes into the hospital today with vomiting diarrhea as well as cough. The patient reports that she is coughing so much that her chest is starting to hurt and she feels short of breath. The patient receive a liter of normal saline as well as some Zofran and Tussionex. She will also receive a dose of potassium as her potassium  is low likely due to her vomiting and her diarrhea.  The patient is sleeping and in no acute distress. She was able to drink without any more vomiting here. The patient will be discharged home to follow-up with her primary care physician. ____________________________________________   FINAL CLINICAL IMPRESSION(S) / ED DIAGNOSES  Final diagnoses:  Vomiting and diarrhea  Cough      Rebecka Apley, MD 08/20/15 [phone]

## 2015-08-19 NOTE — ED Notes (Signed)
Pt presents to ED with frequent vomiting and diarrhea for the past 2 days. Pt reports she has congestion and coughing as well that is making her feel like her chest is heavy and she cant cough anything out. Pt states she has generalized weakness and has been unable to keep anything down.

## 2015-08-20 MED ORDER — HYDROCOD POLST-CPM POLST ER 10-8 MG/5ML PO SUER
5.0000 mL | Freq: Two times a day (BID) | ORAL | Status: DC
Start: 2015-08-20 — End: 2018-02-22

## 2015-08-20 MED ORDER — ONDANSETRON 4 MG PO TBDP: 4.0000 mg | ORAL_TABLET | Freq: Three times a day (TID) | ORAL | Status: DC | PRN

## 2015-08-20 NOTE — Discharge Instructions (Signed)
Cough, Adult Coughing is a reflex that clears your throat and your airways. Coughing helps to heal and protect your lungs. It is normal to cough occasionally, but a cough that happens with other symptoms or lasts a long time may be a sign of a condition that needs treatment. A cough may last only 2-3 weeks (acute), or it may last longer than 8 weeks (chronic). CAUSES Coughing is commonly caused by:  Breathing in substances that irritate your lungs.  A viral or bacterial respiratory infection.  Allergies.  Asthma.  Postnasal drip.  Smoking.  Acid backing up from the stomach into the esophagus (gastroesophageal reflux).  Certain medicines.  Chronic lung problems, including COPD (or rarely, lung cancer).  Other medical conditions such as heart failure. HOME CARE INSTRUCTIONS  Pay attention to any changes in your symptoms. Take these actions to help with your discomfort:  Take medicines only as told by your health care provider.  If you were prescribed an antibiotic medicine, take it as told by your health care provider. Do not stop taking the antibiotic even if you start to feel better.  Talk with your health care provider before you take a cough suppressant medicine.  Drink enough fluid to keep your urine clear or pale yellow.  If the air is dry, use a cold steam vaporizer or humidifier in your bedroom or your home to help loosen secretions.  Avoid anything that causes you to cough at work or at home.  If your cough is worse at night, try sleeping in a semi-upright position.  Avoid cigarette smoke. If you smoke, quit smoking. If you need help quitting, ask your health care provider.  Avoid caffeine.  Avoid alcohol.  Rest as needed. SEEK MEDICAL CARE IF:   You have new symptoms.  You cough up pus.  Your cough does not get better after 2-3 weeks, or your cough gets worse.  You cannot control your cough with suppressant medicines and you are losing sleep.  You  develop pain that is getting worse or pain that is not controlled with pain medicines.  You have a fever.  You have unexplained weight loss.  You have night sweats. SEEK IMMEDIATE MEDICAL CARE IF:  You cough up blood.  You have difficulty breathing.  Your heartbeat is very fast.   This information is not intended to replace advice given to you by your health care provider. Make sure you discuss any questions you have with your health care provider.   Document Released: 11/06/2010 Document Revised: 01/29/2015 Document Reviewed: 07/17/2014 Elsevier Interactive Patient Education 2016 Elsevier Inc.  Nausea and Vomiting Nausea is a sick feeling that often comes before throwing up (vomiting). Vomiting is a reflex where stomach contents come out of your mouth. Vomiting can cause severe loss of body fluids (dehydration). Children and elderly adults can become dehydrated quickly, especially if they also have diarrhea. Nausea and vomiting are symptoms of a condition or disease. It is important to find the cause of your symptoms. CAUSES   Direct irritation of the stomach lining. This irritation can result from increased acid production (gastroesophageal reflux disease), infection, food poisoning, taking certain medicines (such as nonsteroidal anti-inflammatory drugs), alcohol use, or tobacco use.  Signals from the brain.These signals could be caused by a headache, heat exposure, an inner ear disturbance, increased pressure in the brain from injury, infection, a tumor, or a concussion, pain, emotional stimulus, or metabolic problems.  An obstruction in the gastrointestinal tract (bowel obstruction).  Illnesses such  as diabetes, hepatitis, gallbladder problems, appendicitis, kidney problems, cancer, sepsis, atypical symptoms of a heart attack, or eating disorders.  Medical treatments such as chemotherapy and radiation.  Receiving medicine that makes you sleep (general anesthetic) during  surgery. DIAGNOSIS Your caregiver may ask for tests to be done if the problems do not improve after a few days. Tests may also be done if symptoms are severe or if the reason for the nausea and vomiting is not clear. Tests may include:  Urine tests.  Blood tests.  Stool tests.  Cultures (to look for evidence of infection).  X-rays or other imaging studies. Test results can help your caregiver make decisions about treatment or the need for additional tests. TREATMENT You need to stay well hydrated. Drink frequently but in small amounts.You may wish to drink water, sports drinks, clear broth, or eat frozen ice pops or gelatin dessert to help stay hydrated.When you eat, eating slowly may help prevent nausea.There are also some antinausea medicines that may help prevent nausea. HOME CARE INSTRUCTIONS   Take all medicine as directed by your caregiver.  If you do not have an appetite, do not force yourself to eat. However, you must continue to drink fluids.  If you have an appetite, eat a normal diet unless your caregiver tells you differently.  Eat a variety of complex carbohydrates (rice, wheat, potatoes, bread), lean meats, yogurt, fruits, and vegetables.  Avoid high-fat foods because they are more difficult to digest.  Drink enough water and fluids to keep your urine clear or pale yellow.  If you are dehydrated, ask your caregiver for specific rehydration instructions. Signs of dehydration may include:  Severe thirst.  Dry lips and mouth.  Dizziness.  Dark urine.  Decreasing urine frequency and amount.  Confusion.  Rapid breathing or pulse. SEEK IMMEDIATE MEDICAL CARE IF:   You have blood or brown flecks (like coffee grounds) in your vomit.  You have black or bloody stools.  You have a severe headache or stiff neck.  You are confused.  You have severe abdominal pain.  You have chest pain or trouble breathing.  You do not urinate at least once every 8  hours.  You develop cold or clammy skin.  You continue to vomit for longer than 24 to 48 hours.  You have a fever. MAKE SURE YOU:   Understand these instructions.  Will watch your condition.  Will get help right away if you are not doing well or get worse.   This information is not intended to replace advice given to you by your health care provider. Make sure you discuss any questions you have with your health care provider.   Document Released: 05/10/2005 Document Revised: 08/02/2011 Document Reviewed: 10/07/2010 Elsevier Interactive Patient Education 2016 Elsevier Inc.  Diarrhea Diarrhea is watery poop (stool). It can make you feel weak, tired, thirsty, or give you a dry mouth (signs of dehydration). Watery poop is a sign of another problem, most often an infection. It often lasts 2-3 days. It can last longer if it is a sign of something serious. Take care of yourself as told by your doctor. HOME CARE   Drink 1 cup (8 ounces) of fluid each time you have watery poop.  Do not drink the following fluids:  Those that contain simple sugars (fructose, glucose, galactose, lactose, sucrose, maltose).  Sports drinks.  Fruit juices.  Whole milk products.  Sodas.  Drinks with caffeine (coffee, tea, soda) or alcohol.  Oral rehydration solution may be  used if the doctor says it is okay. You may make your own solution. Follow this recipe:   - teaspoon table salt.   teaspoon baking soda.   teaspoon salt substitute containing potassium chloride.  1 tablespoons sugar.  1 liter (34 ounces) of water.  Avoid the following foods:  High fiber foods, such as raw fruits and vegetables.  Nuts, seeds, and whole grain breads and cereals.   Those that are sweetened with sugar alcohols (xylitol, sorbitol, mannitol).  Try eating the following foods:  Starchy foods, such as rice, toast, pasta, low-sugar cereal, oatmeal, baked potatoes, crackers, and  bagels.  Bananas.  Applesauce.  Eat probiotic-rich foods, such as yogurt and milk products that are fermented.  Wash your hands well after each time you have watery poop.  Only take medicine as told by your doctor.  Take a warm bath to help lessen burning or pain from having watery poop. GET HELP RIGHT AWAY IF:   You cannot drink fluids without throwing up (vomiting).  You keep throwing up.  You have blood in your poop, or your poop looks black and tarry.  You do not pee (urinate) in 6-8 hours, or there is only a small amount of very dark pee.  You have belly (abdominal) pain that gets worse or stays in the same spot (localizes).  You are weak, dizzy, confused, or light-headed.  You have a very bad headache.  Your watery poop gets worse or does not get better.  You have a fever or lasting symptoms for more than 2-3 days.  You have a fever and your symptoms suddenly get worse. MAKE SURE YOU:   Understand these instructions.  Will watch your condition.  Will get help right away if you are not doing well or get worse.   This information is not intended to replace advice given to you by your health care provider. Make sure you discuss any questions you have with your health care provider.   Document Released: 10/27/2007 Document Revised: 05/31/2014 Document Reviewed: 01/16/2012 Elsevier Interactive Patient Education 2016 [employer].  Food Choices to Help Relieve Diarrhea, Adult When you have diarrhea, the foods you eat and your eating habits are very important. Choosing the right foods and drinks can help relieve diarrhea. Also, because diarrhea can last up to 7 days, you need to replace lost fluids and electrolytes (such as sodium, potassium, and chloride) in order to help prevent dehydration.  WHAT GENERAL GUIDELINES DO I NEED TO FOLLOW?  Slowly drink 1 cup (8 oz) of fluid for each episode of diarrhea. If you are getting enough fluid, your urine will be clear or  pale yellow.  Eat starchy foods. Some good choices include white rice, white toast, pasta, low-fiber cereal, baked potatoes (without the skin), saltine crackers, and bagels.  Avoid large servings of any cooked vegetables.  Limit fruit to two servings per day. A serving is  cup or 1 small piece.  Choose foods with less than 2 g of fiber per serving.  Limit fats to less than 8 tsp (38 g) per day.  Avoid fried foods.  Eat foods that have probiotics in them. Probiotics can be found in certain dairy products.  Avoid foods and beverages that may increase the speed at which food moves through the stomach and intestines (gastrointestinal tract). Things to avoid include:  High-fiber foods, such as dried fruit, raw fruits and vegetables, nuts, seeds, and whole grain foods.  Spicy foods and high-fat foods.  Foods and  beverages sweetened with high-fructose corn syrup, honey, or sugar alcohols such as xylitol, sorbitol, and mannitol. WHAT FOODS ARE RECOMMENDED? Grains White rice. White, [place], or pita breads (fresh or toasted), including plain rolls, buns, or bagels. White pasta. Saltine, soda, or graham crackers. Pretzels. Low-fiber cereal. Cooked cereals made with water (such as cornmeal, farina, or cream cereals). Plain muffins. Matzo. Melba toast. Zwieback.  Vegetables Potatoes (without the skin). Strained tomato and vegetable juices. Most well-cooked and canned vegetables without seeds. Tender lettuce. Fruits Cooked or canned applesauce, apricots, cherries, fruit cocktail, grapefruit, peaches, pears, or plums. Fresh bananas, apples without skin, cherries, grapes, cantaloupe, grapefruit, peaches, oranges, or plums.  Meat and Other Protein Products Baked or boiled chicken. Eggs. Tofu. Fish. Seafood. Smooth peanut butter. Ground or well-cooked tender beef, ham, veal, lamb, pork, or poultry.  Dairy Plain yogurt, kefir, and unsweetened liquid yogurt. Lactose-free milk, buttermilk, or soy milk.  Plain hard cheese. Beverages Sport drinks. Clear broths. Diluted fruit juices (except prune). Regular, caffeine-free sodas such as ginger ale. Water. Decaffeinated teas. Oral rehydration solutions. Sugar-free beverages not sweetened with sugar alcohols. Other Bouillon, broth, or soups made from recommended foods.  The items listed above may not be a complete list of recommended foods or beverages. Contact your dietitian for more options. WHAT FOODS ARE NOT RECOMMENDED? Grains Whole grain, whole wheat, bran, or rye breads, rolls, pastas, crackers, and cereals. Wild or brown rice. Cereals that contain more than 2 g of fiber per serving. Corn tortillas or taco shells. Cooked or dry oatmeal. Granola. Popcorn. Vegetables Raw vegetables. Cabbage, broccoli, Brussels sprouts, artichokes, baked beans, beet greens, corn, kale, legumes, peas, sweet potatoes, and yams. Potato skins. Cooked spinach and cabbage. Fruits Dried fruit, including raisins and dates. Raw fruits. Stewed or dried prunes. Fresh apples with skin, apricots, mangoes, pears, raspberries, and strawberries.  Meat and Other Protein Products Chunky peanut butter. Nuts and seeds. Beans and lentils. Tomasa BlaseBacon.  Dairy High-fat cheeses. Milk, chocolate milk, and beverages made with milk, such as milk shakes. Cream. Ice cream. Sweets and Desserts Sweet rolls, doughnuts, and sweet breads. Pancakes and waffles. Fats and Oils Butter. Cream sauces. Margarine. Salad oils. Plain salad dressings. Olives. Avocados.  Beverages Caffeinated beverages (such as coffee, tea, soda, or energy drinks). Alcoholic beverages. Fruit juices with pulp. Prune juice. Soft drinks sweetened with high-fructose corn syrup or sugar alcohols. Other Coconut. Hot sauce. Chili powder. Mayonnaise. Gravy. Cream-based or milk-based soups.  The items listed above may not be a complete list of foods and beverages to avoid. Contact your dietitian for more information. WHAT SHOULD I DO  IF I BECOME DEHYDRATED? Diarrhea can sometimes lead to dehydration. Signs of dehydration include dark urine and dry mouth and skin. If you think you are dehydrated, you should rehydrate with an oral rehydration solution. These solutions can be purchased at pharmacies, retail stores, or online.  Drink -1 cup (120-240 mL) of oral rehydration solution each time you have an episode of diarrhea. If drinking this amount makes your diarrhea worse, try drinking smaller amounts more often. For example, drink 1-3 tsp (5-15 mL) every 5-10 minutes.  A general rule for staying hydrated is to drink 1-2 L of fluid per day. Talk to your health care provider about the specific amount you should be drinking each day. Drink enough fluids to keep your urine clear or pale yellow.   This information is not intended to replace advice given to you by your health care provider. Make sure you discuss any questions  you have with your health care provider.   Document Released: 07/31/2003 Document Revised: 05/31/2014 Document Reviewed: 04/02/2013 Elsevier Interactive Patient Education [employer]2016 Elsevier Inc.

## 2016-07-31 DIAGNOSIS — H66001 Acute suppurative otitis media without spontaneous rupture of ear drum, right ear: Secondary | ICD-10-CM | POA: Diagnosis not present

## 2016-08-09 ENCOUNTER — Encounter: Payer: Self-pay | Admitting: Emergency Medicine

## 2016-08-09 ENCOUNTER — Emergency Department
Admission: EM | Admit: 2016-08-09 | Discharge: 2016-08-09 | Disposition: A | Discharge status: Home / Self Care | Payer: Commercial Managed Care - PPO | Attending: Emergency Medicine | Admitting: Emergency Medicine

## 2016-08-09 DIAGNOSIS — Y999 Unspecified external cause status: Secondary | ICD-10-CM | POA: Insufficient documentation

## 2016-08-09 DIAGNOSIS — S39012A Strain of muscle, fascia and tendon of lower back, initial encounter: Secondary | ICD-10-CM | POA: Diagnosis not present

## 2016-08-09 DIAGNOSIS — Y939 Activity, unspecified: Secondary | ICD-10-CM | POA: Diagnosis not present

## 2016-08-09 DIAGNOSIS — M545 Low back pain: Secondary | ICD-10-CM | POA: Diagnosis present

## 2016-08-09 DIAGNOSIS — Z87891 Personal history of nicotine dependence: Secondary | ICD-10-CM | POA: Diagnosis not present

## 2016-08-09 DIAGNOSIS — X58XXXA Exposure to other specified factors, initial encounter: Secondary | ICD-10-CM | POA: Diagnosis not present

## 2016-08-09 DIAGNOSIS — Y929 Unspecified place or not applicable: Secondary | ICD-10-CM | POA: Insufficient documentation

## 2016-08-09 DIAGNOSIS — F1290 Cannabis use, unspecified, uncomplicated: Secondary | ICD-10-CM | POA: Insufficient documentation

## 2016-08-09 DIAGNOSIS — Z79899 Other long term (current) drug therapy: Secondary | ICD-10-CM | POA: Insufficient documentation

## 2016-08-09 LAB — URINALYSIS, ROUTINE W REFLEX MICROSCOPIC
BILIRUBIN URINE: NEGATIVE
Glucose, UA: NEGATIVE mg/dL
Ketones, ur: NEGATIVE mg/dL
Leukocytes, UA: NEGATIVE
NITRITE: NEGATIVE
PROTEIN: NEGATIVE mg/dL
Specific Gravity, Urine: 1.01 (ref 1.005–1.030)
pH: 5 (ref 5.0–8.0)

## 2016-08-09 LAB — POCT PREGNANCY, URINE: PREG TEST UR: NEGATIVE

## 2016-08-09 MED ORDER — TRAMADOL HCL 50 MG PO TABS: 50.0000 mg | ORAL_TABLET | Freq: Four times a day (QID) | ORAL | 0 refills | Status: DC | PRN

## 2016-08-09 MED ORDER — CYCLOBENZAPRINE HCL 10 MG PO TABS: 10.0000 mg | ORAL_TABLET | Freq: Three times a day (TID) | ORAL | 0 refills | Status: DC | PRN

## 2016-08-09 MED ORDER — IBUPROFEN 600 MG PO TABS: 600.0000 mg | ORAL_TABLET | Freq: Three times a day (TID) | ORAL | 0 refills | Status: DC | PRN

## 2016-08-09 NOTE — ED Notes (Signed)
Developed lower back pain couple of days ago  States pain is mainly on the right side and non radiating  Denies any urinary sxs; at present

## 2016-08-09 NOTE — ED Provider Notes (Signed)
[facility] Emergency Department Provider Note   ____________________________________________   First MD Initiated Contact with Patient 08/09/16 1234     (approximate)  I have reviewed the triage vital signs and the nursing notes.   HISTORY  Chief Complaint Back Pain    HPI Debra Hinton is a 21 y.o. female patient complaining of low back pain which began yesterday. Patient denies any provocative incident for this back pain. Patient denies any radicular component to her back pain. Patient denies any bladder or bowel dysfunction.Patient rates the pain as a 5/10. No palliative measures for this complaint. Patient denies any abdominal pain.   Past Medical History:  Diagnosis Date  . Anxiety   . Depressive disorder     Patient Active Problem List   Diagnosis Date Noted  . Pregnancy 02/10/2015  . Premature rupture of membranes 02/10/2015  . Indication for care in labor or delivery 02/10/2015  . Labor and delivery, indication for care 01/02/2015    Past Surgical History:  Procedure Laterality Date  . NO PAST SURGERIES      Prior to Admission medications   Medication Sig Start Date End Date Taking? Authorizing Provider  amoxicillin-clavulanate (AUGMENTIN) 875-125 MG tablet Take 1 tablet by mouth 2 (two) times daily.   Yes Historical Provider, MD  chlorpheniramine-HYDROcodone (TUSSIONEX PENNKINETIC ER) 10-8 MG/5ML SUER Take 5 mLs by mouth 2 (two) times daily. 08/20/15   Rebecka Apley, MD  cyclobenzaprine (FLEXERIL) 10 MG tablet Take 1 tablet (10 mg total) by mouth 3 (three) times daily as needed. 08/09/16   Joni Reining, PA-C  ferrous sulfate 325 (65 FE) MG tablet Take 1 tablet (325 mg total) by mouth daily with breakfast. 02/12/15   Vena Austria, MD  ibuprofen (ADVIL,MOTRIN) 600 MG tablet Take 1 tablet (600 mg total) by mouth every 6 (six) hours as needed for mild pain, moderate pain or cramping. 02/12/15   Vena Austria, MD  ibuprofen  (ADVIL,MOTRIN) 600 MG tablet Take 1 tablet (600 mg total) by mouth every 8 (eight) hours as needed. 08/09/16   Joni Reining, PA-C  norethindrone (MICRONOR,CAMILA,ERRIN) 0.35 MG tablet Take 1 tablet (0.35 mg total) by mouth daily. 02/12/15   Vena Austria, MD  ondansetron (ZOFRAN ODT) 4 MG disintegrating tablet Take 1 tablet (4 mg total) by mouth every 8 (eight) hours as needed for nausea or vomiting. 08/20/15   Rebecka Apley, MD  prenatal vitamin w/FE, FA (NATACHEW) 29-1 MG CHEW chewable tablet Chew 1 tablet by mouth daily at 12 noon.    Historical Provider, MD  traMADol (ULTRAM) 50 MG tablet Take 1 tablet (50 mg total) by mouth every 6 (six) hours as needed for moderate pain. 08/09/16   Joni Reining, PA-C    Allergies Patient has no known allergies.  Family History  Problem Relation Age of Onset  . Breast cancer      mat aunt, mggm    Social History Social History  Substance Use Topics  . Smoking status: Former Games developer  . Smokeless tobacco: Never Used  . Alcohol use No    Review of Systems Constitutional: No fever/chills Eyes: No visual changes. ENT: No sore throat. Cardiovascular: Denies chest pain. Respiratory: Denies shortness of breath. Gastrointestinal: No abdominal pain.  No nausea, no vomiting.  No diarrhea.  No constipation. Genitourinary: Negative for dysuria. Musculoskeletal: Negative for back pain. Skin: Negative for rash. Neurological: Negative for headaches, focal weakness or numbness. Psychiatric:Anxiety and depression  ____________________________________________   PHYSICAL  EXAM:  VITAL SIGNS: ED Triage Vitals  Enc Vitals Group     BP 08/09/16 1222 136/81     Pulse Rate 08/09/16 1222 (!) 109     Resp --      Temp 08/09/16 1222 97.9 F (36.6 C)     Temp Source 08/09/16 1222 Oral     SpO2 08/09/16 1222 98 %     Weight 08/09/16 1222 130 lb (59 kg)     Height 08/09/16 1222 5\' 2"  (1.575 m)     Head Circumference --      Peak Flow --      Pain  Score 08/09/16 1223 5     Pain Loc --      Pain Edu? --      Excl. in GC? --     Constitutional: Alert and oriented. Well appearing and in no acute distress. Eyes: Conjunctivae are normal. PERRL. EOMI. Head: Atraumatic. Nose: No congestion/rhinnorhea. Mouth/Throat: Mucous membranes are moist.  Oropharynx non-erythematous. Neck: No stridor. No cervical spine tenderness to palpation. Hematological/Lymphatic/Immunilogical: No cervical lymphadenopathy. Cardiovascular: Normal rate, regular rhythm. Grossly normal heart sounds.  Good peripheral circulation. Respiratory: Normal respiratory effort.  No retractions. Lungs CTAB. Gastrointestinal: Soft and nontender. No distention. No abdominal bruits. Bilateral CVA guarding Musculoskeletal: No obvious spinal deformity. Patient have full nuchal range of motion. Moderate guarding palpation of L2-L3.  Neurologic:  Normal speech and language. No gross focal neurologic deficits are appreciated. No gait instability. Skin:  Skin is warm, dry and intact. No rash noted. Psychiatric: Mood and affect are normal. Speech and behavior are normal.  ____________________________________________   LABS (all labs ordered are listed, but only abnormal results are displayed)  Labs Reviewed  URINALYSIS, ROUTINE W REFLEX MICROSCOPIC - Abnormal; Notable for the following:       Result Value   Color, Urine YELLOW (*)    APPearance CLEAR (*)    Hgb urine dipstick SMALL (*)    All other components within normal limits  POC URINE PREG, ED  POCT PREGNANCY, URINE   ____________________________________________  EKG   ____________________________________________  RADIOLOGY   ____________________________________________   PROCEDURES  Procedure(s) performed: None  Procedures  Critical Care performed: No  ____________________________________________   INITIAL IMPRESSION / ASSESSMENT AND PLAN / ED COURSE  Pertinent labs & imaging results that were  available during my care of the patient were reviewed by me and considered in my medical decision making (see chart for details).  Lumbar strain. Patient given discharge care instructions. Patient given prescription for tramadol for 3 days, Flexeril and ibuprofen for 5 days. Patient given a work note and advised follow-up family doctor condition persists.      ____________________________________________   FINAL CLINICAL IMPRESSION(S) / ED DIAGNOSES  Final diagnoses:  Strain of lumbar region, initial encounter      NEW MEDICATIONS STARTED DURING THIS VISIT:  New Prescriptions   CYCLOBENZAPRINE (FLEXERIL) 10 MG TABLET    Take 1 tablet (10 mg total) by mouth 3 (three) times daily as needed.   IBUPROFEN (ADVIL,MOTRIN) 600 MG TABLET    Take 1 tablet (600 mg total) by mouth every 8 (eight) hours as needed.   TRAMADOL (ULTRAM) 50 MG TABLET    Take 1 tablet (50 mg total) by mouth every 6 (six) hours as needed for moderate pain.     Note:  This document was prepared using Dragon voice recognition software and may include unintentional dictation errors.    Joni Reiningonald K Alfonse Garringer, PA-C 08/09/16 1323  Rockne Menghini, MD 08/09/16 1541

## 2016-08-09 NOTE — ED Triage Notes (Signed)
Pt reports low back pain that began yesterday. Pt denies dysuria.

## 2016-09-30 ENCOUNTER — Emergency Department
Admission: EM | Admit: 2016-09-30 | Discharge: 2016-09-30 | Disposition: A | Discharge status: Home / Self Care | Payer: Commercial Managed Care - PPO | Attending: Emergency Medicine | Admitting: Emergency Medicine

## 2016-09-30 DIAGNOSIS — Z79899 Other long term (current) drug therapy: Secondary | ICD-10-CM | POA: Diagnosis not present

## 2016-09-30 DIAGNOSIS — N3000 Acute cystitis without hematuria: Secondary | ICD-10-CM | POA: Diagnosis not present

## 2016-09-30 DIAGNOSIS — B9689 Other specified bacterial agents as the cause of diseases classified elsewhere: Secondary | ICD-10-CM | POA: Diagnosis not present

## 2016-09-30 DIAGNOSIS — R350 Frequency of micturition: Secondary | ICD-10-CM | POA: Diagnosis present

## 2016-09-30 DIAGNOSIS — Z87891 Personal history of nicotine dependence: Secondary | ICD-10-CM | POA: Diagnosis not present

## 2016-09-30 LAB — URINALYSIS, COMPLETE (UACMP) WITH MICROSCOPIC
BACTERIA UA: NONE SEEN
BILIRUBIN URINE: NEGATIVE
Glucose, UA: NEGATIVE mg/dL
KETONES UR: NEGATIVE mg/dL
Nitrite: NEGATIVE
PROTEIN: 30 mg/dL — AB
Specific Gravity, Urine: 1.012 (ref 1.005–1.030)
pH: 7 (ref 5.0–8.0)

## 2016-09-30 LAB — POCT PREGNANCY, URINE: PREG TEST UR: NEGATIVE

## 2016-09-30 MED ORDER — TRAMADOL HCL 50 MG PO TABS: 50.0000 mg | ORAL_TABLET | Freq: Four times a day (QID) | ORAL | 0 refills | Status: DC | PRN

## 2016-09-30 MED ORDER — PHENAZOPYRIDINE HCL 200 MG PO TABS: 200.0000 mg | ORAL_TABLET | Freq: Three times a day (TID) | ORAL | 0 refills | Status: DC | PRN

## 2016-09-30 MED ORDER — KETOROLAC TROMETHAMINE 60 MG/2ML IM SOLN
60.0000 mg | Freq: Once | INTRAMUSCULAR | Status: AC
Start: 2016-09-30 — End: 2016-09-30
  Administered 2016-09-30: 60 mg via INTRAMUSCULAR
  Filled 2016-09-30: qty 2

## 2016-09-30 MED ORDER — PHENAZOPYRIDINE HCL 200 MG PO TABS
200.0000 mg | ORAL_TABLET | Freq: Once | ORAL | Status: AC
  Administered 2016-09-30: 200 mg via ORAL
  Filled 2016-09-30: qty 1

## 2016-09-30 MED ORDER — TRAMADOL HCL 50 MG PO TABS
50.0000 mg | ORAL_TABLET | Freq: Once | ORAL | Status: DC
  Filled 2016-09-30: qty 1

## 2016-09-30 MED ORDER — SULFAMETHOXAZOLE-TRIMETHOPRIM 800-160 MG PO TABS: 1.0000 | ORAL_TABLET | Freq: Two times a day (BID) | ORAL | 0 refills | Status: DC

## 2016-09-30 NOTE — ED Notes (Signed)
Pt ambulatory pt to STAT desk holding back.

## 2016-09-30 NOTE — ED Notes (Signed)
See triage note  States she woke up about 3 am with lower back pain  States pain was non radiating  Also is having some urinary freq and pain

## 2016-09-30 NOTE — Discharge Instructions (Signed)
Past retested in 10 days.

## 2016-09-30 NOTE — ED Provider Notes (Signed)
[facility] Emergency Department Provider Note   ____________________________________________   First MD Initiated Contact with Patient 09/30/16 [phone]     (approximate)  I have reviewed the triage vital signs and the nursing notes.   HISTORY  Chief Complaint Urinary Frequency and Back Pain    HPI Debra Hinton is a 21 y.o. female patient complain of urinary frequency ,urgency, and dysuria for 2 days. Patient stated this morning awaking with bilateral back pain. Patient states she took Flexeril prior to arrival with no relief of the back pain. Patient medication to make her feel drowsy. Patient denies any vaginal discharge, fever, or flank pain.   Past Medical History:  Diagnosis Date  . Anxiety   . Depressive disorder     Patient Active Problem List   Diagnosis Date Noted  . Pregnancy 02/10/2015  . Premature rupture of membranes 02/10/2015  . Indication for care in labor or delivery 02/10/2015  . Labor and delivery, indication for care 01/02/2015    Past Surgical History:  Procedure Laterality Date  . NO PAST SURGERIES      Prior to Admission medications   Medication Sig Start Date End Date Taking? Authorizing Provider  amoxicillin-clavulanate (AUGMENTIN) 875-125 MG tablet Take 1 tablet by mouth 2 (two) times daily.    [provider]  chlorpheniramine-HYDROcodone (TUSSIONEX PENNKINETIC ER) 10-8 MG/5ML SUER Take 5 mLs by mouth 2 (two) times daily. 08/20/15   Rebecka Apley, MD  cyclobenzaprine (FLEXERIL) 10 MG tablet Take 1 tablet (10 mg total) by mouth 3 (three) times daily as needed. 08/09/16   Joni Reining, PA-C  ferrous sulfate 325 (65 FE) MG tablet Take 1 tablet (325 mg total) by mouth daily with breakfast. 02/12/15   Vena Austria, MD  ibuprofen (ADVIL,MOTRIN) 600 MG tablet Take 1 tablet (600 mg total) by mouth every 6 (six) hours as needed for mild pain, moderate pain or cramping. 02/12/15   Vena Austria, MD    ibuprofen (ADVIL,MOTRIN) 600 MG tablet Take 1 tablet (600 mg total) by mouth every 8 (eight) hours as needed. 08/09/16   Joni Reining, PA-C  norethindrone (MICRONOR,CAMILA,ERRIN) 0.35 MG tablet Take 1 tablet (0.35 mg total) by mouth daily. 02/12/15   Vena Austria, MD  ondansetron (ZOFRAN ODT) 4 MG disintegrating tablet Take 1 tablet (4 mg total) by mouth every 8 (eight) hours as needed for nausea or vomiting. 08/20/15   Rebecka Apley, MD  phenazopyridine (PYRIDIUM) 200 MG tablet Take 1 tablet (200 mg total) by mouth 3 (three) times daily as needed for pain. 09/30/16   Joni Reining, PA-C  prenatal vitamin w/FE, FA (NATACHEW) 29-1 MG CHEW chewable tablet Chew 1 tablet by mouth daily at 12 noon.    [provider]  sulfamethoxazole-trimethoprim (BACTRIM DS,SEPTRA DS) 800-160 MG tablet Take 1 tablet by mouth 2 (two) times daily. 09/30/16   Joni Reining, PA-C  traMADol (ULTRAM) 50 MG tablet Take 1 tablet (50 mg total) by mouth every 6 (six) hours as needed for moderate pain. 08/09/16   Joni Reining, PA-C  traMADol (ULTRAM) 50 MG tablet Take 1 tablet (50 mg total) by mouth every 6 (six) hours as needed for moderate pain. 09/30/16   Joni Reining, PA-C    Allergies Patient has no known allergies.  Family History  Problem Relation Age of Onset  . Breast cancer Unknown        mat aunt, mggm    Social History Social History  Substance Use Topics  . Smoking status: Former Games developer  . Smokeless tobacco: Never Used  . Alcohol use No    Review of Systems Constitutional: No fever/chills Eyes: No visual changes. ENT: No sore throat. Cardiovascular: Denies chest pain. Respiratory: Denies shortness of breath. Gastrointestinal: No abdominal pain.  No nausea, no vomiting.  No diarrhea.  No constipation. Genitourinary: Positive for dysuria. Musculoskeletal:Positive for back pain. Skin: Negative for rash. Neurological: Negative for headaches, focal weakness or  numbness. Endocrine:Anxiety and depression  ____________________________________________   PHYSICAL EXAM:  VITAL SIGNS: ED Triage Vitals [09/30/16 0917]  Enc Vitals Group     BP (!) 153/97     Pulse Rate 82     Resp 18     Temp 98.4 F (36.9 C)     Temp Source Oral     SpO2 100 %     Weight 167 lb (75.8 kg)     Height 5' (1.524 m)     Head Circumference      Peak Flow      Pain Score 9     Pain Loc      Pain Edu?      Excl. in GC?     Constitutional: Alert and oriented. Well appearing and in no acute distress. Eyes: Conjunctivae are normal. PERRL. EOMI. Head: Atraumatic. Nose: No congestion/rhinnorhea. Mouth/Throat: Mucous membranes are moist.  Oropharynx non-erythematous. Neck: No stridor.  No cervical spine tenderness to palpation. Hematological/Lymphatic/Immunilogical: No cervical lymphadenopathy. Cardiovascular: Normal rate, regular rhythm. Grossly normal heart sounds.  Good peripheral circulation. Respiratory: Normal respiratory effort.  No retractions. Lungs CTAB. Gastrointestinal: Soft and nontender. No distention. No abdominal bruits. No CVA tenderness. Musculoskeletal: No lower extremity tenderness nor edema.  No joint effusions. Neurologic:  Normal speech and language. No gross focal neurologic deficits are appreciated. No gait instability. Skin:  Skin is warm, dry and intact. No rash noted. Psychiatric: Mood and affect are normal. Speech and behavior are normal.  ____________________________________________   LABS (all labs ordered are listed, but only abnormal results are displayed)  Labs Reviewed  URINALYSIS, COMPLETE (UACMP) WITH MICROSCOPIC - Abnormal; Notable for the following:       Result Value   Color, Urine YELLOW (*)    APPearance HAZY (*)    Hgb urine dipstick MODERATE (*)    Protein, ur 30 (*)    Leukocytes, UA SMALL (*)    Squamous Epithelial / LPF 0-5 (*)    All other components within normal limits  POC URINE PREG, ED  POCT  PREGNANCY, URINE   ____________________________________________  EKG   ____________________________________________  RADIOLOGY   ____________________________________________   PROCEDURES  Procedure(s) performed: None  Procedures  Critical Care performed: No  ____________________________________________   INITIAL IMPRESSION / ASSESSMENT AND PLAN / ED COURSE  Pertinent labs & imaging results that were available during my care of the patient were reviewed by me and considered in my medical decision making (see chart for details).  Urinary tract infection. Discussed labs also patient. Patient given discharge care instructions. Patient advised to have a urine retested after finishing antibiotics.      ____________________________________________   FINAL CLINICAL IMPRESSION(S) / ED DIAGNOSES  Final diagnoses:  Acute cystitis without hematuria      NEW MEDICATIONS STARTED DURING THIS VISIT:  New Prescriptions   PHENAZOPYRIDINE (PYRIDIUM) 200 MG TABLET    Take 1 tablet (200 mg total) by mouth 3 (three) times daily as needed for pain.   SULFAMETHOXAZOLE-TRIMETHOPRIM (BACTRIM DS,SEPTRA DS) 800-160 MG  TABLET    Take 1 tablet by mouth 2 (two) times daily.   TRAMADOL (ULTRAM) 50 MG TABLET    Take 1 tablet (50 mg total) by mouth every 6 (six) hours as needed for moderate pain.     Note:  This document was prepared using Dragon voice recognition software and may include unintentional dictation errors.    Joni ReiningSmith, Nichola Warren K, PA-C 09/30/16 1018    Don PerkingVeronese, [place], MD 10/01/16 [phone]

## 2016-09-30 NOTE — ED Triage Notes (Signed)
Pt arrives to ER via POV c/o burning and frequency with urination X 2 days. Lower back pain that began this AM. PT took flexeril PTA. No back injury. Pt alert and oriented X4, active, cooperative, pt in NAD. RR even and unlabored, color WNL.  Pt provided wheelchair.

## 2016-11-30 ENCOUNTER — Other Ambulatory Visit: Payer: Self-pay | Admitting: Student

## 2016-11-30 DIAGNOSIS — R1013 Epigastric pain: Secondary | ICD-10-CM

## 2016-11-30 DIAGNOSIS — R140 Abdominal distension (gaseous): Secondary | ICD-10-CM

## 2016-12-07 ENCOUNTER — Ambulatory Visit: Payer: Commercial Managed Care - PPO

## 2017-01-25 DIAGNOSIS — R300 Dysuria: Secondary | ICD-10-CM | POA: Diagnosis not present

## 2017-01-25 DIAGNOSIS — K219 Gastro-esophageal reflux disease without esophagitis: Secondary | ICD-10-CM | POA: Diagnosis not present

## 2017-01-25 DIAGNOSIS — R140 Abdominal distension (gaseous): Secondary | ICD-10-CM | POA: Diagnosis not present

## 2017-03-10 DIAGNOSIS — J069 Acute upper respiratory infection, unspecified: Secondary | ICD-10-CM | POA: Diagnosis not present

## 2017-05-24 DIAGNOSIS — E0590 Thyrotoxicosis, unspecified without thyrotoxic crisis or storm: Secondary | ICD-10-CM

## 2017-05-24 HISTORY — DX: Thyrotoxicosis, unspecified without thyrotoxic crisis or storm: E05.90

## 2017-06-03 DIAGNOSIS — Z20818 Contact with and (suspected) exposure to other bacterial communicable diseases: Secondary | ICD-10-CM | POA: Diagnosis not present

## 2017-06-03 DIAGNOSIS — R6889 Other general symptoms and signs: Secondary | ICD-10-CM | POA: Diagnosis not present

## 2017-06-03 DIAGNOSIS — J029 Acute pharyngitis, unspecified: Secondary | ICD-10-CM | POA: Diagnosis not present

## 2017-08-06 DIAGNOSIS — M654 Radial styloid tenosynovitis [de Quervain]: Secondary | ICD-10-CM | POA: Diagnosis not present

## 2017-08-06 DIAGNOSIS — M25531 Pain in right wrist: Secondary | ICD-10-CM | POA: Diagnosis not present

## 2017-11-19 DIAGNOSIS — M654 Radial styloid tenosynovitis [de Quervain]: Secondary | ICD-10-CM | POA: Diagnosis not present

## 2018-02-22 ENCOUNTER — Ambulatory Visit: Payer: Commercial Managed Care - PPO | Admitting: Nurse Practitioner

## 2018-02-22 ENCOUNTER — Encounter: Payer: Self-pay | Admitting: Nurse Practitioner

## 2018-02-22 VITALS — BP 118/78 | HR 101 | Resp 16 | Ht 60.0 in | Wt 185.0 lb

## 2018-02-22 DIAGNOSIS — K581 Irritable bowel syndrome with constipation: Secondary | ICD-10-CM | POA: Diagnosis not present

## 2018-02-22 DIAGNOSIS — Z0001 Encounter for general adult medical examination with abnormal findings: Secondary | ICD-10-CM

## 2018-02-22 DIAGNOSIS — E559 Vitamin D deficiency, unspecified: Secondary | ICD-10-CM

## 2018-02-22 DIAGNOSIS — R635 Abnormal weight gain: Secondary | ICD-10-CM | POA: Diagnosis not present

## 2018-02-22 DIAGNOSIS — F411 Generalized anxiety disorder: Secondary | ICD-10-CM | POA: Insufficient documentation

## 2018-02-22 MED ORDER — OMEPRAZOLE 20 MG PO CPDR
20.0000 mg | DELAYED_RELEASE_CAPSULE | Freq: Every day | ORAL | 3 refills | Status: DC
Start: 2018-02-22 — End: 2018-09-07

## 2018-02-22 MED ORDER — CITALOPRAM HYDROBROMIDE 10 MG PO TABS: 10.0000 mg | ORAL_TABLET | Freq: Every day | ORAL | 3 refills | Status: DC

## 2018-02-22 NOTE — Progress Notes (Deleted)
[facility] [address] [place], [place] [address]  Internal MEDICINE  Office Visit Note  Patient Name: Debra Hinton  [id number]  [id number]  Date of Service: 02/22/2018  Chief Complaint  Patient presents with  . Depression    new patient establish care   . Anxiety  . Irritable Bowel Syndrome  . Medical Management of Chronic Issues    weight loss management     The patient is transferring her primary care from another office. She has history of generalized anxiety disorder. She takes citalopram 10mg  daily and this helps to keep her symptoms well managed. She was recently started on omeprazole due to IBS. Was getting very constipated and her stomach would get very bloated, hard, and tender. Since starting the omeprazole, she has not had significant symptoms.  She does have OB/GYN provider. She is scheduled to have IUD removed later this month. She would like to try to conceive another baby, starting around February. To prepare for a healthy pregnancy, she would like some help losing weight. She states that she is already exercising three to four days a week, running on elliptical for 3 miles. She closely managed her carbohydrate and sugar intake and tries to eat increased protein and greens.Despite these efforts, she has had trouble losing weight.       Current Medication: Outpatient Encounter Medications as of 02/22/2018  Medication Sig  . citalopram (CELEXA) 10 MG tablet Take by mouth.  . Levonorgestrel (LILETTA) 19.5 MCG/DAY IUD IUD by Intrauterine route.  Marland Kitchen omeprazole (PRILOSEC) 20 MG capsule Take 20 mg by mouth daily.  . [DISCONTINUED] amoxicillin-clavulanate (AUGMENTIN) 875-125 MG tablet Take 1 tablet by mouth 2 (two) times daily.  . [DISCONTINUED] chlorpheniramine-HYDROcodone (TUSSIONEX PENNKINETIC ER) 10-8 MG/5ML SUER Take 5 mLs by mouth 2 (two) times daily.  . [DISCONTINUED] cyclobenzaprine (FLEXERIL) 10 MG tablet Take 1 tablet (10 mg total) by mouth 3  (three) times daily as needed. (Patient not taking: Reported on 02/22/2018)  . [DISCONTINUED] ferrous sulfate 325 (65 FE) MG tablet Take 1 tablet (325 mg total) by mouth daily with breakfast.  . [DISCONTINUED] ibuprofen (ADVIL,MOTRIN) 600 MG tablet Take 1 tablet (600 mg total) by mouth every 6 (six) hours as needed for mild pain, moderate pain or cramping.  . [DISCONTINUED] ibuprofen (ADVIL,MOTRIN) 600 MG tablet Take 1 tablet (600 mg total) by mouth every 8 (eight) hours as needed.  . [DISCONTINUED] norethindrone (MICRONOR,CAMILA,ERRIN) 0.35 MG tablet Take 1 tablet (0.35 mg total) by mouth daily.  . [DISCONTINUED] ondansetron (ZOFRAN ODT) 4 MG disintegrating tablet Take 1 tablet (4 mg total) by mouth every 8 (eight) hours as needed for nausea or vomiting.  . [DISCONTINUED] phenazopyridine (PYRIDIUM) 200 MG tablet Take 1 tablet (200 mg total) by mouth 3 (three) times daily as needed for pain.  . [DISCONTINUED] prenatal vitamin w/FE, FA (NATACHEW) 29-1 MG CHEW chewable tablet Chew 1 tablet by mouth daily at 12 noon.  . [DISCONTINUED] sulfamethoxazole-trimethoprim (BACTRIM DS,SEPTRA DS) 800-160 MG tablet Take 1 tablet by mouth 2 (two) times daily.  . [DISCONTINUED] traMADol (ULTRAM) 50 MG tablet Take 1 tablet (50 mg total) by mouth every 6 (six) hours as needed for moderate pain.  . [DISCONTINUED] traMADol (ULTRAM) 50 MG tablet Take 1 tablet (50 mg total) by mouth every 6 (six) hours as needed for moderate pain.   No facility-administered encounter medications on file as of 02/22/2018.     Surgical History: Past Surgical History:  Procedure Laterality Date  . NO PAST SURGERIES  Medical History: Past Medical History:  Diagnosis Date  . Anxiety   . Depressive disorder   . GERD (gastroesophageal reflux disease)   . IBS (irritable bowel syndrome)     Family History: Family History  Problem Relation Age of Onset  . Breast cancer Unknown        mat aunt, mggm    Social History    Socioeconomic History  . Marital status: Single    Spouse name: Not on file  . Number of children: Not on file  . Years of education: Not on file  . Highest education level: Not on file  Occupational History  . Not on file  Social Needs  . Financial resource strain: Not on file  . Food insecurity:    Worry: Not on file    Inability: Not on file  . Transportation needs:    Medical: Not on file    Non-medical: Not on file  Tobacco Use  . Smoking status: Former Games developer  . Smokeless tobacco: Never Used  Substance and Sexual Activity  . Alcohol use: No  . Drug use: Yes    Types: Marijuana  . Sexual activity: Yes  Lifestyle  . Physical activity:    Days per week: Not on file    Minutes per session: Not on file  . Stress: Not on file  Relationships  . Social connections:    Talks on phone: Not on file    Gets together: Not on file    Attends religious service: Not on file    Active member of club or organization: Not on file    Attends meetings of clubs or organizations: Not on file    Relationship status: Not on file  . Intimate partner violence:    Fear of current or ex partner: Not on file    Emotionally abused: Not on file    Physically abused: Not on file    Forced sexual activity: Not on file  Other Topics Concern  . Not on file  Social History Narrative  . Not on file      Review of Systems  Today's Vitals   02/22/18 1116  BP: 118/78  Pulse: (!) 101  Resp: 16  SpO2: 98%  Weight: 185 lb (83.9 kg)  Height: 5' (1.524 m)    Physical Exam     Assessment/Plan:   General Counseling: Lulia verbalizes understanding of the findings of todays visit and agrees with plan of treatment. I have discussed any further diagnostic evaluation that may be needed or ordered today. We also reviewed her medications today. she has been encouraged to call the office with any questions or concerns that should arise related to todays visit.    No orders of the defined  types were placed in this encounter.   No orders of the defined types were placed in this encounter.   Time spent:*** Minutes      Dr Lyndon Code Internal medicine

## 2018-02-22 NOTE — Progress Notes (Signed)
[facility] [address] [place], [place] [address]  Internal MEDICINE  Office Visit Note  Patient Name: Debra Hinton  [id number]  [id number]  Date of Service: 02/22/2018   Complaints/HPI Pt is here for establishment of PCP. Chief Complaint  Patient presents with  . Depression    new patient establish care   . Anxiety  . Irritable Bowel Syndrome  . Medical Management of Chronic Issues    weight loss management    The patient is transferring her care from another primary care provider. She hss history of generalized anxiety disorder. She takes citalopram 10mg  daily for this. Effective in keeping symptoms well managed. She was recently diagnosed with IBS.  She was getting so constipated that her abdomen was getting very bloated and would get hard and tender. Since starting on omeprazole, which she does not take every day, her IBS has not been a problem  She does have an OB/GYN provider. She has a three year old son and does have IUD. She has upcoming appointment to have her IUD removed. She would like to conceive another baby starting in February. To prepare for health pregnancy, she would like some help losing weight. She states that she is already exercising three to four times per week, using the elliptical machine to run three miles. She closely manges her carbohydrate and sugar intake and tries to balance this with protein and lots of vegetables. Despite these efforts, she has had trouble losing weight.     Current Medication: Outpatient Encounter Medications as of 02/22/2018  Medication Sig  . citalopram (CELEXA) 10 MG tablet Take 1 tablet (10 mg total) by mouth daily.  . Levonorgestrel (LILETTA) 19.5 MCG/DAY IUD IUD by Intrauterine route.  Marland Kitchen omeprazole (PRILOSEC) 20 MG capsule Take 1 capsule (20 mg total) by mouth daily.  . [DISCONTINUED] citalopram (CELEXA) 10 MG tablet Take by mouth.  . [DISCONTINUED] omeprazole (PRILOSEC) 20 MG capsule Take 20 mg by mouth daily.   . [DISCONTINUED] amoxicillin-clavulanate (AUGMENTIN) 875-125 MG tablet Take 1 tablet by mouth 2 (two) times daily.  . [DISCONTINUED] chlorpheniramine-HYDROcodone (TUSSIONEX PENNKINETIC ER) 10-8 MG/5ML SUER Take 5 mLs by mouth 2 (two) times daily.  . [DISCONTINUED] cyclobenzaprine (FLEXERIL) 10 MG tablet Take 1 tablet (10 mg total) by mouth 3 (three) times daily as needed. (Patient not taking: Reported on 02/22/2018)  . [DISCONTINUED] ferrous sulfate 325 (65 FE) MG tablet Take 1 tablet (325 mg total) by mouth daily with breakfast.  . [DISCONTINUED] ibuprofen (ADVIL,MOTRIN) 600 MG tablet Take 1 tablet (600 mg total) by mouth every 6 (six) hours as needed for mild pain, moderate pain or cramping.  . [DISCONTINUED] ibuprofen (ADVIL,MOTRIN) 600 MG tablet Take 1 tablet (600 mg total) by mouth every 8 (eight) hours as needed.  . [DISCONTINUED] norethindrone (MICRONOR,CAMILA,ERRIN) 0.35 MG tablet Take 1 tablet (0.35 mg total) by mouth daily.  . [DISCONTINUED] ondansetron (ZOFRAN ODT) 4 MG disintegrating tablet Take 1 tablet (4 mg total) by mouth every 8 (eight) hours as needed for nausea or vomiting.  . [DISCONTINUED] phenazopyridine (PYRIDIUM) 200 MG tablet Take 1 tablet (200 mg total) by mouth 3 (three) times daily as needed for pain.  . [DISCONTINUED] prenatal vitamin w/FE, FA (NATACHEW) 29-1 MG CHEW chewable tablet Chew 1 tablet by mouth daily at 12 noon.  . [DISCONTINUED] sulfamethoxazole-trimethoprim (BACTRIM DS,SEPTRA DS) 800-160 MG tablet Take 1 tablet by mouth 2 (two) times daily.  . [DISCONTINUED] traMADol (ULTRAM) 50 MG tablet Take 1 tablet (50 mg total) by  mouth every 6 (six) hours as needed for moderate pain.  . [DISCONTINUED] traMADol (ULTRAM) 50 MG tablet Take 1 tablet (50 mg total) by mouth every 6 (six) hours as needed for moderate pain.   No facility-administered encounter medications on file as of 02/22/2018.     Surgical History: Past Surgical History:  Procedure Laterality Date  . NO  PAST SURGERIES      Medical History: Past Medical History:  Diagnosis Date  . Anxiety   . Depressive disorder   . GERD (gastroesophageal reflux disease)   . IBS (irritable bowel syndrome)     Family History: Family History  Problem Relation Age of Onset  . Breast cancer Unknown        mat aunt, mggm    Social History   Socioeconomic History  . Marital status: Single    Spouse name: Not on file  . Number of children: Not on file  . Years of education: Not on file  . Highest education level: Not on file  Occupational History  . Not on file  Social Needs  . Financial resource strain: Not on file  . Food insecurity:    Worry: Not on file    Inability: Not on file  . Transportation needs:    Medical: Not on file    Non-medical: Not on file  Tobacco Use  . Smoking status: Former Games developer  . Smokeless tobacco: Never Used  Substance and Sexual Activity  . Alcohol use: No  . Drug use: Yes    Types: Marijuana  . Sexual activity: Yes  Lifestyle  . Physical activity:    Days per week: Not on file    Minutes per session: Not on file  . Stress: Not on file  Relationships  . Social connections:    Talks on phone: Not on file    Gets together: Not on file    Attends religious service: Not on file    Active member of club or organization: Not on file    Attends meetings of clubs or organizations: Not on file    Relationship status: Not on file  . Intimate partner violence:    Fear of current or ex partner: Not on file    Emotionally abused: Not on file    Physically abused: Not on file    Forced sexual activity: Not on file  Other Topics Concern  . Not on file  Social History Narrative  . Not on file     Review of Systems  Constitutional: Negative for activity change, chills, fatigue and unexpected weight change.  HENT: Negative for congestion, postnasal drip, rhinorrhea, sneezing, sore throat and voice change.   Eyes: Negative.  Negative for redness.   Respiratory: Negative for cough, chest tightness, shortness of breath and wheezing.   Cardiovascular: Negative for chest pain and palpitations.  Gastrointestinal: Positive for constipation and diarrhea. Negative for abdominal distention, abdominal pain, nausea and vomiting.       Oscillates between diarrhea and constipation based on eating and stress levels. Has been better since stating on omeprazole.   Endocrine: Negative for cold intolerance, heat intolerance, polydipsia, polyphagia and polyuria.  Genitourinary: Negative for dysuria and frequency.  Musculoskeletal: Negative for arthralgias, back pain, joint swelling and neck pain.  Skin: Negative for rash.  Allergic/Immunologic: Negative for environmental allergies.  Neurological: Negative for dizziness, tremors, numbness and headaches.  Hematological: Negative for adenopathy. Does not bruise/bleed easily.  Psychiatric/Behavioral: Positive for dysphoric mood. Negative for behavioral problems (Depression), sleep  disturbance and suicidal ideas. The patient is nervous/anxious.        Well managed on current medications .    Vital Signs: BP 118/78   Pulse (!) 101   Resp 16   Ht 5' (1.524 m)   Wt 185 lb (83.9 kg)   SpO2 98%   BMI 36.13 kg/m    Physical Exam  Constitutional: She is oriented to person, place, and time. She appears well-developed and well-nourished. No distress.  HENT:  Head: Normocephalic and atraumatic.  Nose: Nose normal.  Mouth/Throat: Oropharynx is clear and moist. No oropharyngeal exudate.  Eyes: Pupils are equal, round, and reactive to light. Conjunctivae and EOM are normal.  Neck: Normal range of motion. Neck supple. No JVD present. No tracheal deviation present. No thyromegaly present.  Cardiovascular: Normal rate, regular rhythm and normal heart sounds. Exam reveals no gallop and no friction rub.  No murmur heard. Pulmonary/Chest: Effort normal and breath sounds normal. No respiratory distress. She has no  wheezes. She has no rales. She exhibits no tenderness.  Abdominal: Soft. Bowel sounds are normal. There is no tenderness.  Musculoskeletal: Normal range of motion.  Lymphadenopathy:    She has no cervical adenopathy.  Neurological: She is alert and oriented to person, place, and time. No cranial nerve deficit.  Skin: Skin is warm and dry. Capillary refill takes less than 2 seconds. She is not diaphoretic.  Psychiatric: She has a normal mood and affect. Her behavior is normal. Judgment and thought content normal.  Nursing note and vitals reviewed.  Assessment/Plan: 1. Irritable bowel syndrome with constipation Doing well with current dose of omeprazole as needed. New prescription sent today.  - omeprazole (PRILOSEC) 20 MG capsule; Take 1 capsule (20 mg total) by mouth daily.  Dispense: 90 capsule; Refill: 3  2. Abnormal weight gain Check labs including full thyroid panel. Nova diet plans and menus for 1600 and 1800 calorie diets were given. Will get metabolic test at her next visit and discuss starting appetite suppressant at that time.   3. Generalized anxiety disorder - citalopram (CELEXA) 10 MG tablet; Take 1 tablet (10 mg total) by mouth daily.  Dispense: 90 tablet; Refill: 3  General Counseling: Athaliah verbalizes understanding of the findings of todays visit and agrees with plan of treatment. I have discussed any further diagnostic evaluation that may be needed or ordered today. We also reviewed her medications today. she has been encouraged to call the office with any questions or concerns that should arise related to todays visit.    Counseling:   There is a liability release in patients' chart. There has been a 10 minute discussion about the side effects including but not limited to elevated blood pressure, anxiety, lack of sleep and dry mouth. Pt understands and will like to start/continue on appetite suppressant at this time. There will be one month RX given at the time of visit  with proper follow up. Nova diet plan with restricted calories is given to the pt. Pt understands and agrees with  plan of treatment  This patient was seen by Vincent Gros FNP Collaboration with Dr Lyndon Code as a part of collaborative care agreement   Meds ordered this encounter  Medications  . citalopram (CELEXA) 10 MG tablet    Sig: Take 1 tablet (10 mg total) by mouth daily.    Dispense:  90 tablet    Refill:  3    Order Specific Question:   Supervising Provider    Answer:  KHAN, FOZIA M [[id number]]  . omeprazole (PRILOSEC) 20 MG capsule    Sig: Take 1 capsule (20 mg total) by mouth daily.    Dispense:  90 capsule    Refill:  3    Order Specific Question:   Supervising Provider    Answer:   Lyndon Code [[id number]]    Time spent: 20 Minutes

## 2018-02-27 ENCOUNTER — Other Ambulatory Visit: Payer: Self-pay

## 2018-02-27 NOTE — Telephone Encounter (Signed)
Pt called that she had panic attack yesterday I advised she need to been seen she said she had appt coming up she can discuss with adam on next visit

## 2018-02-28 NOTE — Progress Notes (Signed)
SCANNED IN NEW PT PAPERWORK SIGNED ON 02/22/18.

## 2018-03-02 DIAGNOSIS — E559 Vitamin D deficiency, unspecified: Secondary | ICD-10-CM | POA: Diagnosis not present

## 2018-03-02 DIAGNOSIS — Z0001 Encounter for general adult medical examination with abnormal findings: Secondary | ICD-10-CM | POA: Diagnosis not present

## 2018-03-02 DIAGNOSIS — R635 Abnormal weight gain: Secondary | ICD-10-CM | POA: Diagnosis not present

## 2018-03-06 ENCOUNTER — Ambulatory Visit: Payer: Commercial Managed Care - PPO | Admitting: Adult Health

## 2018-03-06 ENCOUNTER — Encounter: Payer: Self-pay | Admitting: Adult Health

## 2018-03-06 VITALS — BP 118/78 | HR 75 | Resp 16 | Ht 60.0 in | Wt 186.0 lb

## 2018-03-06 DIAGNOSIS — K581 Irritable bowel syndrome with constipation: Secondary | ICD-10-CM

## 2018-03-06 DIAGNOSIS — F411 Generalized anxiety disorder: Secondary | ICD-10-CM

## 2018-03-06 DIAGNOSIS — E0590 Thyrotoxicosis, unspecified without thyrotoxic crisis or storm: Secondary | ICD-10-CM

## 2018-03-06 DIAGNOSIS — R635 Abnormal weight gain: Secondary | ICD-10-CM | POA: Diagnosis not present

## 2018-03-06 MED ORDER — ALPRAZOLAM 0.25 MG PO TABS: ORAL_TABLET | ORAL | 0 refills | Status: DC

## 2018-03-06 NOTE — Progress Notes (Signed)
[facility] [place],  [address]  Internal MEDICINE  Office Visit Note  Patient Name: Debra Hinton  [id number]  [id number]  Date of Service: 03/06/2018  Chief Complaint  Patient presents with  . Gastroesophageal Reflux  . Irritable Bowel Syndrome  . Medical Management of Chronic Issues    wight loss management    HPI Pt here for follow up on GERD, IBS, and medical management of weight loss.  She reports good results with Omeprazole.  She is also taking the omeprazole for IBS which she reports good results with.  She has not had any episodes recently.  She does report having a panic attack about every 4-6 months that she has difficulty with.  She reports no matter what she does, it takes a few hours to calm down.  She is requesting some medication to combat this when it rarely happens.      Current Medication: Outpatient Encounter Medications as of 03/06/2018  Medication Sig  . citalopram (CELEXA) 10 MG tablet Take 1 tablet (10 mg total) by mouth daily.  . Levonorgestrel (LILETTA) 19.5 MCG/DAY IUD IUD by Intrauterine route.  Marland Kitchen omeprazole (PRILOSEC) 20 MG capsule Take 1 capsule (20 mg total) by mouth daily.  Marland Kitchen ALPRAZolam (XANAX) 0.25 MG tablet Half to one tab tab as needed for panic attacks   No facility-administered encounter medications on file as of 03/06/2018.     Surgical History: Past Surgical History:  Procedure Laterality Date  . NO PAST SURGERIES      Medical History: Past Medical History:  Diagnosis Date  . Anxiety   . Depressive disorder   . GERD (gastroesophageal reflux disease)   . IBS (irritable bowel syndrome)     Family History: Family History  Problem Relation Age of Onset  . Breast cancer Unknown        mat aunt, mggm    Social History   Socioeconomic History  . Marital status: Single    Spouse name: Not on file  . Number of children: Not on file  . Years of education: Not on file  . Highest education  level: Not on file  Occupational History  . Not on file  Social Needs  . Financial resource strain: Not on file  . Food insecurity:    Worry: Not on file    Inability: Not on file  . Transportation needs:    Medical: Not on file    Non-medical: Not on file  Tobacco Use  . Smoking status: Former Research scientist (life sciences)  . Smokeless tobacco: Never Used  Substance and Sexual Activity  . Alcohol use: No  . Drug use: Yes    Types: Marijuana  . Sexual activity: Yes  Lifestyle  . Physical activity:    Days per week: Not on file    Minutes per session: Not on file  . Stress: Not on file  Relationships  . Social connections:    Talks on phone: Not on file    Gets together: Not on file    Attends religious service: Not on file    Active member of club or organization: Not on file    Attends meetings of clubs or organizations: Not on file    Relationship status: Not on file  . Intimate partner violence:    Fear of current or ex partner: Not on file    Emotionally abused: Not on file    Physically abused: Not on file    Forced sexual activity: Not  on file  Other Topics Concern  . Not on file  Social History Narrative  . Not on file      Review of Systems  Constitutional: Negative for chills, fatigue and unexpected weight change.  HENT: Negative for congestion, rhinorrhea, sneezing and sore throat.   Eyes: Negative for photophobia, pain and redness.  Respiratory: Negative for cough, chest tightness and shortness of breath.   Cardiovascular: Negative for chest pain and palpitations.  Gastrointestinal: Negative for abdominal pain, constipation, diarrhea, nausea and vomiting.  Endocrine: Negative.   Genitourinary: Negative for dysuria and frequency.  Musculoskeletal: Negative for arthralgias, back pain, joint swelling and neck pain.  Skin: Negative for rash.  Allergic/Immunologic: Negative.   Neurological: Negative for tremors and numbness.  Hematological: Negative for adenopathy. Does not  bruise/bleed easily.  Psychiatric/Behavioral: Negative for behavioral problems and sleep disturbance. The patient is not nervous/anxious.     Vital Signs: BP 118/78   Pulse 75   Resp 16   Ht 5' (1.524 m)   Wt 186 lb (84.4 kg)   SpO2 99%   BMI 36.33 kg/m    Physical Exam  Constitutional: She is oriented to person, place, and time. She appears well-developed and well-nourished. No distress.  HENT:  Head: Normocephalic and atraumatic.  Mouth/Throat: Oropharynx is clear and moist. No oropharyngeal exudate.  Eyes: Pupils are equal, round, and reactive to light. EOM are normal.  Neck: Normal range of motion. Neck supple. No JVD present. No tracheal deviation present. No thyromegaly present.  Cardiovascular: Normal rate, regular rhythm and normal heart sounds. Exam reveals no gallop and no friction rub.  No murmur heard. Pulmonary/Chest: Effort normal and breath sounds normal. No respiratory distress. She has no wheezes. She has no rales. She exhibits no tenderness.  Abdominal: Soft. There is no tenderness. There is no guarding.  Musculoskeletal: Normal range of motion.  Lymphadenopathy:    She has no cervical adenopathy.  Neurological: She is alert and oriented to person, place, and time. No cranial nerve deficit.  Skin: Skin is warm and dry. She is not diaphoretic.  Psychiatric: She has a normal mood and affect. Her behavior is normal. Judgment and thought content normal.  Nursing note and vitals reviewed.  Assessment/Plan: 1. Hyperthyroidism Based on most recent TSH which was decreased and elevated T3 and free T4, will refer patient to endocrinology will order more thyroid labs and antibodies as well as do thyroid ultrasound. Thyroid antibodies, lab - US THYROID; Future - Ambulatory referral to Endocrinology  2. Abnormal weight gain Met all test conducted today. Patient has normal metabolism, weight gain is likely due to hyperthyroid.  Thyroid is addressed by endocrinology will  discuss antidepressant and weight loss further.- Metabolic Test  3. Generalized anxiety disorder Patient reports infrequent panic attacks that are lasting more than a couple of hours.  She reports this happens once or twice a year and would like to have a small amount of medication on hand for when this happens.  I am inclined to give her 5 tablets of 0.25 mg Xanax at this time. Will follow-up with this in the future. - ALPRAZolam (XANAX) 0.25 MG tablet; Half to one tab tab as needed for panic attacks  Dispense: 5 tablet; Refill: 0  4. Irritable bowel syndrome with constipation Patient denies current issues with her IBS at this time.  Continue current treatment.   General Counseling: Devon verbalizes understanding of the findings of todays visit and agrees with plan of treatment. I have discussed any  further diagnostic evaluation that may be needed or ordered today. We also reviewed her medications today. she has been encouraged to call the office with any questions or concerns that should arise related to todays visit.    Orders Placed This Encounter  Procedures  . Metabolic Test  . US THYROID  . Thyroid peroxidase antibody  . Thyroglobulin antibody  . Ambulatory referral to Endocrinology    Meds ordered this encounter  Medications  . ALPRAZolam (XANAX) 0.25 MG tablet    Sig: Half to one tab tab as needed for panic attacks    Dispense:  5 tablet    Refill:  0    Time spent: 25 Minutes   This patient was seen by Orson Gear AGNP-C in Collaboration with Dr Lavera Guise as a part of collaborative care agreement     Kendell Bane AGNP-C Internal medicine

## 2018-03-06 NOTE — Patient Instructions (Signed)
Hyperthyroidism Hyperthyroidism is when the thyroid is too active (overactive). Your thyroid is a large gland that is located in your neck. The thyroid helps to control how your body uses food (metabolism). When your thyroid is overactive, it produces too much of a hormone called thyroxine. What are the causes? Causes of hyperthyroidism may include:  Graves disease. This is when your immune system attacks the thyroid gland. This is the most common cause.  Inflammation of the thyroid gland.  Tumor in the thyroid gland or somewhere else.  Excessive use of thyroid medicines, including: ? Prescription thyroid supplement. ? Herbal supplements that mimic thyroid hormones.  Solid or fluid-filled lumps within your thyroid gland (thyroid nodules).  Excessive ingestion of iodine.  What increases the risk?  Being female.  Having a family history of thyroid conditions. What are the signs or symptoms? Signs and symptoms of hyperthyroidism may include:  Nervousness.  Inability to tolerate heat.  Unexplained weight loss.  Diarrhea.  Change in the texture of hair or skin.  Heart skipping beats or making extra beats.  Rapid heart rate.  Loss of menstruation.  Shaky hands.  Fatigue.  Restlessness.  Increased appetite.  Sleep problems.  Enlarged thyroid gland or nodules.  How is this diagnosed? Diagnosis of hyperthyroidism may include:  Medical history and physical exam.  Blood tests.  Ultrasound tests.  How is this treated? Treatment may include:  Medicines to control your thyroid.  Surgery to remove your thyroid.  Radiation therapy.  Follow these instructions at home:  Take medicines only as directed by your health care provider.  Do not use any tobacco products, including cigarettes, chewing tobacco, or electronic cigarettes. If you need help quitting, ask your health care provider.  Do not exercise or do physical activity until your health care provider  approves.  Keep all follow-up appointments as directed by your health care provider. This is important. Contact a health care provider if:  Your symptoms do not get better with treatment.  You have fever.  You are taking thyroid replacement medicine and you: ? Have depression. ? Feel mentally and physically slow. ? Have weight gain. Get help right away if:  You have decreased alertness or a change in your awareness.  You have abdominal pain.  You feel dizzy.  You have a rapid heartbeat.  You have an irregular heartbeat. This information is not intended to replace advice given to you by your health care provider. Make sure you discuss any questions you have with your health care provider. Document Released: 05/10/2005 Document Revised: 10/09/2015 Document Reviewed: 09/25/2013 Elsevier Interactive Patient Education  2018 Elsevier Inc.  

## 2018-03-07 LAB — COMPREHENSIVE METABOLIC PANEL
A/G RATIO: 1.8 (ref 1.2–2.2)
ALT: 17 IU/L (ref 0–32)
AST: 16 IU/L (ref 0–40)
Albumin: 4.3 g/dL (ref 3.5–5.5)
Alkaline Phosphatase: 70 IU/L (ref 39–117)
BUN/Creatinine Ratio: 15 (ref 9–23)
BUN: 10 mg/dL (ref 6–20)
Bilirubin Total: 0.6 mg/dL (ref 0.0–1.2)
CALCIUM: 9.4 mg/dL (ref 8.7–10.2)
CO2: 22 mmol/L (ref 20–29)
CREATININE: 0.68 mg/dL (ref 0.57–1.00)
Chloride: 102 mmol/L (ref 96–106)
GFR, EST AFRICAN AMERICAN: 144 mL/min/{1.73_m2} (ref >59)
GFR, EST NON AFRICAN AMERICAN: 125 mL/min/{1.73_m2} (ref >59)
GLOBULIN, TOTAL: 2.4 g/dL (ref 1.5–4.5)
Glucose: 92 mg/dL (ref 65–99)
POTASSIUM: 4.4 mmol/L (ref 3.5–5.2)
SODIUM: 139 mmol/L (ref 134–144)
TOTAL PROTEIN: 6.7 g/dL (ref 6.0–8.5)

## 2018-03-07 LAB — T3: T3 TOTAL: 231 ng/dL — AB (ref 71–180)

## 2018-03-07 LAB — CBC WITH DIFFERENTIAL/PLATELET
BASOS: 0 %
Basophils Absolute: 0 10*3/uL (ref 0.0–0.2)
EOS (ABSOLUTE): 0.2 10*3/uL (ref 0.0–0.4)
Eos: 3 %
HEMATOCRIT: 45.5 % (ref 34.0–46.6)
Hemoglobin: 14.9 g/dL (ref 11.1–15.9)
IMMATURE GRANULOCYTES: 0 %
Immature Grans (Abs): 0 10*3/uL (ref 0.0–0.1)
Lymphocytes Absolute: 1.4 10*3/uL (ref 0.7–3.1)
Lymphs: 27 %
MCH: 29.2 pg (ref 26.6–33.0)
MCHC: 32.7 g/dL (ref 31.5–35.7)
MCV: 89 fL (ref 79–97)
MONOCYTES: 7 %
MONOS ABS: 0.4 10*3/uL (ref 0.1–0.9)
NEUTROS PCT: 63 %
Neutrophils Absolute: 3.1 10*3/uL (ref 1.4–7.0)
Platelets: 251 10*3/uL (ref 150–450)
RBC: 5.1 x10E6/uL (ref 3.77–5.28)
RDW: 11.9 % — ABNORMAL LOW (ref 12.3–15.4)
WBC: 5 10*3/uL (ref 3.4–10.8)

## 2018-03-07 LAB — T4, FREE: Free T4: 1.94 ng/dL — ABNORMAL HIGH (ref 0.82–1.77)

## 2018-03-07 LAB — TSH

## 2018-03-07 LAB — VITAMIN D 1,25 DIHYDROXY
VITAMIN D3 1, 25 (OH): 26 pg/mL
Vitamin D 1, 25 (OH)2 Total: 26 pg/mL
Vitamin D2 1, 25 (OH)2: <10 pg/mL

## 2018-03-07 NOTE — Progress Notes (Signed)
Is this the patient you were talking to me about yesterday?

## 2018-03-10 ENCOUNTER — Encounter: Payer: Self-pay | Admitting: Adult Health

## 2018-03-17 ENCOUNTER — Ambulatory Visit: Payer: Commercial Managed Care - PPO

## 2018-03-17 DIAGNOSIS — E0590 Thyrotoxicosis, unspecified without thyrotoxic crisis or storm: Secondary | ICD-10-CM | POA: Diagnosis not present

## 2018-03-22 ENCOUNTER — Telehealth: Payer: Self-pay

## 2018-03-22 NOTE — Telephone Encounter (Signed)
Pt advised for u/s for thyroid look good send beth for referral

## 2018-03-22 NOTE — Telephone Encounter (Signed)
Thyroid ultrasound looks good. Does she have appointment with endocrinology? That is needed.

## 2018-03-22 NOTE — Telephone Encounter (Signed)
Pt need appt for endocrinology

## 2018-04-07 NOTE — Progress Notes (Signed)
Name: Debra Hinton  MRN/ DOB: [id number], 11/26/1995    Age/ Sex: 22 y.o., female    PCP: Arsenio Loader [facility]   Reason for Endocrinology Evaluation: Hyperthyroidism      Date of Initial Endocrinology Evaluation: 04/10/2018     HPI: Debra Hinton is a 21 y.o. female with a past medical history of GAD, IBS. The patient presented for initial endocrinology clinic visit on 04/10/2018 for consultative assistance with her hyperthyroidism   [name], 2019 with c/o constipation alternating with diarrhea as well as anxiety and weight gain. Her Labs revealed a suppressed  TSH at < 0.006 uIU/mL and elevated FT4 and T3. A thyroid ultrasound was unrevealing.   Today she feels better since restarting citalopram and omeprazole. She does have heat intolerance, weight loss, she continues with intermittent diarrhea.   Denies any local neck symptoms, pain , dysphagia or sob.   Denies any eye pain, gritty sensation or blurry sensation.    FH with thyroid disease.   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 Currently she is on contraception .    HISTORY:  Past Medical History:  Past Medical History:  Diagnosis Date  . Anxiety   . Depressive disorder   . GERD (gastroesophageal reflux disease)   . IBS (irritable bowel syndrome)    Past Surgical History:  Past Surgical History:  Procedure Laterality Date  . NO PAST SURGERIES        Social History:  reports that she has been smoking cigarettes. She has never used smokeless tobacco. She reports that she has current or past drug history. Drug: Marijuana. She reports that she does not drink alcohol.  Family History: family history includes Breast cancer in her unknown relative; Cancer in her paternal grandfather; Dementia in her maternal grandmother; Diabetes  in her paternal grandmother; Healthy in her brother; Heart attack in her maternal grandfather; Hyperlipidemia in her father; Hypertension in her mother; Thyroid disease in her maternal grandmother.   HOME MEDICATIONS: Current Outpatient Medications on File Prior to Visit  Medication Sig Dispense Refill  . ALPRAZolam (XANAX) 0.25 MG tablet TAKE 1/2 TO 1 TABLET BY MOUTH AS NEEDED FOR PANIC ATTACKS  0  . citalopram (CELEXA) 10 MG tablet Take 1 tablet (10 mg total) by mouth daily. 90 tablet 3  . Levonorgestrel (LILETTA) 19.5 MCG/DAY IUD IUD by Intrauterine route.    Marland Kitchen omeprazole (PRILOSEC) 20 MG capsule Take 1 capsule (20 mg total) by mouth daily. 90 capsule 3   No current facility-administered medications on file prior to visit.       REVIEW OF SYSTEMS: A comprehensive ROS was conducted with the patient and is negative except as per HPI and below:  Review of Systems  Constitutional: Positive for malaise/fatigue and weight loss.  HENT: Positive for congestion and sore throat.   Eyes: Negative for blurred vision and pain.  Respiratory: Positive for cough. Negative for shortness of breath.   Cardiovascular: Positive for palpitations. Negative for chest pain.  Gastrointestinal: Positive for constipation and diarrhea.  Genitourinary: Positive for frequency.  Musculoskeletal: Negative for joint pain.  Skin: Negative for itching and rash.  Neurological: Positive for headaches. Negative for tingling.  Endo/Heme/Allergies: Negative for polydipsia.  Psychiatric/Behavioral: Negative for depression. The patient is not nervous/anxious.  OBJECTIVE:  VS: BP 120/81   Pulse 76   Resp 16   Ht 5' (1.524 m)   Wt 184 lb 2 oz (83.5 kg)   BMI 35.96 kg/m    Wt Readings from Last 3 Encounters:  04/10/18 184 lb 2 oz (83.5 kg)  03/06/18 186 lb (84.4 kg)  02/22/18 185 lb (83.9 kg)     EXAM: General: Pt appears well and is in NAD  Hydration: Well-hydrated with moist mucous membranes and  good skin turgor  Eyes: External eye exam normal without stare, lid lag or exophthalmos.  EOM intact.  PERRL.  Ears, Nose, Throat: Hearing: Grossly intact bilaterally Dental: Good dentition  Throat: Clear without mass, erythema or exudate  Neck: General: Supple without adenopathy. Thyroid: Thyroid size normal.  No goiter or nodules appreciated. No thyroid bruit.  Lungs: Clear with good BS bilat with no rales, rhonchi, or wheezes  Heart: Auscultation: RRR.  Abdomen: Normoactive bowel sounds, soft, nontender, without masses or organomegaly palpable  Extremities: BL LE: No pretibial edema normal ROM and strength.  Skin: Hair: Texture and amount normal with gender appropriate distribution Skin Inspection: No rashes, acanthosis nigricans/skin tags.  Skin Palpation: Skin temperature, texture, and thickness normal to palpation  Neuro: Cranial nerves: II - XII grossly intact  Motor: Normal strength throughout DTRs: 2+ and symmetric in UE without delay in relaxation phase  Mental Status: Judgment, insight: Intact Orientation: Oriented to time, place, and person Mood and affect: No depression, anxiety, or agitation     DATA REVIEWED: Results for Debra Hinton, Debra Hinton (MRN [id number]) as of 04/07/2018 15:39  Ref. Range 03/02/2018 11:10  TSH Latest Ref Range: 0.450 - 4.500 uIU/mL <0.006 (L)  Triiodothyronine (T3) Latest Ref Range: 71 - 180 ng/dL 865231 (H)  H8,IONG(EXBMWUT4,Free(Direct) Latest Ref Range: 0.82 - 1.77 ng/dL 1.321.94 (H)   Old records , labs and images have been reviewed.   Thyroid Ultrasound 03/17/18 Thyroid homogenous, no nodules noted.  Results for Debra Hinton, Debra Hinton (MRN [id number]) as of 04/10/2018 14:14  Ref. Range 04/10/2018 09:43  TSH Latest Ref Range: 0.35 - 4.50 uIU/mL <0.01 Repeated and verified X2. (L)  T4,Free(Direct) Latest Ref Range: 0.60 - 1.60 ng/dL 3.661.23   ASSESSMENT/PLAN/RECOMMENDATIONS:   1. 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    - Will repeat TFT's today and obtain TRAb  - We discussed D/D of graves' disease vs toxic adenoma vs toxic MNG which is less likely given recent ultrasound shows no evidence of thyroid nodules.  - I have advised her that pregnancy is not recommended at this time. We discussed    - We discussed that hyperthyroidism is a result of an autoimmune condition involving the thyroid.    -We discussed with pt the benefits of methimazole in the Tx of hyperthyroidism, as well as the possible side effects/complications of anti-thyroid drug Tx (specifically detailing the rare, but serious side effect of agranulocytosis). She was informed of need for regular thyroid function monitoring while on methimazole to ensure appropriate dosage without over-treatment. As well, we discussed the possible side effects of methimazole including the chance of rash, the small chance of liver irritation/juandice and the <=1 in 300-400 chance of sudden onset agranulocytosis.  We discussed importance of going to ED promptly (and stopping methimazole) if shewere to develop significant fever with severe sore throat of other evidence of acute infection.     - We extensively discussed the various treatment options for hyperthyroidism including ablation therapy  with radioactive iodine versus antithyroid drug treatment .  We discussed the various possible benefits versus side effects of the various therapies.   - I carefully explained to the patient that one of the consequences of I-131 ablation treatment would likely be permanent hypothyroidism which would require long-term replacement therapy with LT4.  - We discussed with the patient that if she indeed has graves' disease, she may go in to remission with Methimazole within a year and half and we typically like to give her that chance. We also discussed that if she decided to get pregnant, she will need to be on PTU the initial 3 months. We also discussed RAI ablation as an option , and  that she will have to wait 6 months prior to attempting to conceive.   - Pt became tearful in the office and felt overwhelmed with the information. We agreed to start Methimazole as first step and discuss further on next visit. Again she was cautioned against pregnancy until she is chemically euthyroid at this time.    2. Tobacco abuse:   - Over 3 minutes was spent counseling about tobacco cessation.    Addendum : Repeat labs today show normal FT4 and suppressed TSH. Spoke to the pt about her labs on 11/18 @ 1415 discussed her FT4 is normalizing, I advised her not to start methimazole as she is recovering on her own. Will check TFT's again in 3 weeks, and see how her TSH is recovering. It seems she is going on to remission.  Pt expressed understanding.     F/U in 6 weeks , lab in 3 weeks   Signed electronically by: Lyndle Herrlich, MD  [facility] Endocr[facility]  [facility] [address] [place], [place] [address] Phone: [phone] FAX: [phone]   CC: [facility] [address] [place] [address] Phone: [phone] Fax: [phone]   Return to Endocrinology clinic as below: Future Appointments  Date Time Provider Department Center  04/17/2018  2:30 PM Juanda Bond, Coralee North, NP [facility] None

## 2018-04-10 ENCOUNTER — Ambulatory Visit: Payer: Commercial Managed Care - PPO | Admitting: Internal Medicine

## 2018-04-10 ENCOUNTER — Other Ambulatory Visit: Payer: Self-pay

## 2018-04-10 ENCOUNTER — Encounter: Payer: Self-pay | Admitting: Internal Medicine

## 2018-04-10 VITALS — BP 120/81 | HR 76 | Resp 16 | Ht 60.0 in | Wt 184.1 lb

## 2018-04-10 DIAGNOSIS — E0590 Thyrotoxicosis, unspecified without thyrotoxic crisis or storm: Secondary | ICD-10-CM | POA: Diagnosis not present

## 2018-04-10 LAB — T4, FREE: Free T4: 1.23 ng/dL (ref 0.60–1.60)

## 2018-04-10 LAB — TSH: TSH: <0.01 u[IU]/mL — ABNORMAL LOW (ref 0.35–4.50)

## 2018-04-10 MED ORDER — METHIMAZOLE 10 MG PO TABS: 10.0000 mg | ORAL_TABLET | Freq: Every day | ORAL | 6 refills | Status: DC

## 2018-04-10 NOTE — Patient Instructions (Signed)
We recommend that you follow these hyperthyroidism instructions at home:  1) Take Methimazole 10  mg once a day  If you develop severe sore throat with high fevers OR develop unexplained yellowing of your skin, eyes, under your tongue, severe abdominal pain with nausea or vomiting --> then please get evaluated immediately.   3) Get repeat thyroid labs in 3 weeks.   It is ESSENTIAL to get follow-up labs to help avoid over or undertreatment of your hyperthyroidism - both of which can be dangerous to your health.

## 2018-04-12 LAB — THYROTROPIN RECEPTOR AUTOABS: Thyrotropin Receptor Ab: 30.4 % — ABNORMAL HIGH (ref <=16.0)

## 2018-04-17 ENCOUNTER — Ambulatory Visit: Payer: Self-pay | Admitting: Adult Health

## 2018-05-03 ENCOUNTER — Other Ambulatory Visit (INDEPENDENT_AMBULATORY_CARE_PROVIDER_SITE_OTHER): Payer: Commercial Managed Care - PPO

## 2018-05-03 DIAGNOSIS — E0590 Thyrotoxicosis, unspecified without thyrotoxic crisis or storm: Secondary | ICD-10-CM

## 2018-05-03 LAB — TSH: TSH: <0.01 u[IU]/mL — ABNORMAL LOW (ref 0.35–4.50)

## 2018-05-03 LAB — T4, FREE: Free T4: 1.05 ng/dL (ref 0.60–1.60)

## 2018-05-04 ENCOUNTER — Telehealth: Payer: Self-pay

## 2018-05-04 ENCOUNTER — Other Ambulatory Visit: Payer: Self-pay | Admitting: Adult Health

## 2018-05-04 LAB — T3: T3, Total: 187 ng/dL — ABNORMAL HIGH (ref 76–181)

## 2018-05-04 MED ORDER — ALPRAZOLAM 0.25 MG PO TABS: 0.2500 mg | ORAL_TABLET | Freq: Two times a day (BID) | ORAL | 0 refills | Status: DC | PRN

## 2018-05-04 MED ORDER — SCOPOLAMINE 1 MG/3DAYS TD PT72: 1.0000 | MEDICATED_PATCH | TRANSDERMAL | 0 refills | Status: AC

## 2018-05-04 NOTE — Telephone Encounter (Signed)
Pt called and spoke with Madelaine BhatAdam, also rx sent to her pharmacy

## 2018-05-04 NOTE — Progress Notes (Signed)
Pt sent scopolamine patches and ativan rx refill for cruise

## 2018-05-18 NOTE — Progress Notes (Deleted)
Name: Debra Hinton  MRN/ DOB: [id number], 1995-07-23    Age/ Sex: 22 y.o., female     PCP: Jenne PaneLlc, [facility]   Reason for Endocrinology Evaluation: Graves' Disease      Initial Endocrinology Clinic Visit: 04/10/2018    PATIENT IDENTIFIER: [name] is a 22 y.o., female with a past medical history of GAD and IBS   She has followed with [facility] since 04/10/18 for consultative assistance with management of her hyperthyroidism.   HISTORICAL SUMMARY:   [name], 2019 with c/o constipation alternating with diarrhea as well as anxiety and weight gain. Her Labs revealed a suppressed  TSH at < 0.006 uIU/mL and elevated FT4 and T3. A thyroid ultrasound was unrevealing.     SUBJECTIVE:   During last visit (***): ***  Today (05/18/2018):  [name] {has/has not:9025} followed the recommended instructions, ***. ***   ROS:  As per HPI.   HISTORY:  Past Medical History:  Past Medical History:  Diagnosis Date  . Anxiety   . Depressive disorder   . GERD (gastroesophageal reflux disease)   . IBS (irritable bowel syndrome)     Past Surgical History:  Past Surgical History:  Procedure Laterality Date  . NO PAST SURGERIES       Social History:  reports that she has been smoking cigarettes. She has never used smokeless tobacco. She reports current drug use. Drug: Marijuana. She reports that she does not drink alcohol.  Family History: family history includes Breast cancer in her unknown relative; Cancer in her paternal grandfather; Dementia in her maternal grandmother; Diabetes in her paternal grandmother; Healthy in her brother; Heart attack in her maternal grandfather; Hyperlipidemia in her father; Hypertension in her mother; Thyroid disease in her maternal grandmother.   HOME MEDICATIONS: @HMEDNODATENOPRESCRIBER @    OBJECTIVE:   PHYSICAL EXAM: VS: There were no vitals taken  for this visit.   EXAM: General: Pt appears well and is in NAD  Hydration: Well-hydrated with moist mucous membranes and good skin turgor  Eyes: External eye exam normal without stare, lid lag or exophthalmos.  EOM intact.   Ears, Nose, Throat: Hearing: Grossly intact bilaterally Dental: Good dentition  Throat: Clear without mass, erythema or exudate  Neck: General: Supple without adenopathy. Thyroid: Thyroid size normal.  No goiter or nodules appreciated. No thyroid bruit.  Lungs: Clear with good BS bilat with no rales, rhonchi, or wheezes  Heart: Auscultation: RRR.  Abdomen: Normoactive bowel sounds, soft, nontender, without masses or organomegaly palpable  Extremities:  BL LE: No pretibial edema normal ROM and strength.  Skin: Hair: Texture and amount normal with gender appropriate distribution Skin Inspection: No rashes. Skin Palpation: Skin temperature, texture, and thickness normal to palpation  Neuro: Cranial nerves: II - XII grossly intact  Motor: Normal strength throughout DTRs: 2+ and symmetric in UE without delay in relaxation phase  Mental Status: Judgment, insight: Intact Orientation: Oriented to time, place, and person Mood and affect: No depression, anxiety, or agitation     DATA REVIEWED: Results for Debra Hinton, Debra Hinton (MRN [id number]) as of 05/18/2018 12:38  Ref. Range 03/02/2018 11:10 04/10/2018 09:43 05/03/2018 09:34  TSH Latest Ref Range: 0.35 - 4.50 uIU/mL <0.006 (L) <0.01 Repeated and verified X2. (L) <0.01 (L)  Triiodothyronine (T3) Latest Ref Range: 76 - 181 ng/dL 865231 (H)  784187 (H)  O9,GEXB(MWUXLKT4,Free(Direct) Latest Ref Range: 0.60 - 1.60 ng/dL 4.401.94 (H) 1.021.23  1.05   Results for Debra Hinton, Debra Hinton (MRN [id number]) as of 05/18/2018 12:38  Ref. Range 04/10/2018 09:43  Thyrotropin Receptor Ab Latest Ref Range: <=16.0 % 30.4 (H)    ASSESSMENT / PLAN / RECOMMENDATIONS:   1. Hyperthyroidism Secondary to Graves' Disease   Plan:  ***    Medications   *** 2. Graves'  Disease :   Signed electronically by: Lyndle HerrlichAbby Jaralla Marisol Giambra, MD  [facility]  [facility] [address] [place], [place] [address] Phone: [phone] FAX: [phone]      CC: Jenne PaneLlc, [facility] [address] [place] [address] Phone: [phone]  Fax: [phone]   Return to Endocrinology clinic as below: Future Appointments  Date Time Provider Department Center  05/19/2018  7:30 AM Brantlee Penn, Konrad DoloresIbtehal Jaralla, MD [id number] None

## 2018-05-19 ENCOUNTER — Ambulatory Visit: Payer: Commercial Managed Care - PPO | Admitting: Internal Medicine

## 2018-05-19 DIAGNOSIS — Z0289 Encounter for other administrative examinations: Secondary | ICD-10-CM

## 2018-05-24 NOTE — L&D Delivery Note (Signed)
Date of delivery: 02/25/2019 Estimated Date of Delivery: 03/14/19 Patient's last menstrual period was 05/31/2018. EGA: [redacted]w[redacted]d  Delivery Note At 3:18 PM a viable female was delivered via Vaginal, Spontaneous (Presentation: OA;  ROA).  APGAR: 7, 8; weight 5 lb 4 oz (2380 g).   Placenta status: spontaneous, intact.  Cord:  with the following complications: none.  Cord pH: NA  Went to room to check patient- she was found to be C/C/+2. Mom pushed well to deliver a viable female infant.  The head followed by shoulders, which delivered without difficulty, and the rest of the body.  Nuchal cord noted and easily reduced.  Baby to mom's chest.  Cord clamped and cut after 3 min delay.  Cord blood obtained.  Placenta was initially resistant. Active management and attempted manual extraction were unsuccessful over 30 minute period. Called Dr Gilman Schmidt to evaluate. When she arrived placenta was delivered easily, spontaneously, intact, with a 3-vessel cord.    All counts correct.  Hemostasis obtained with IV pitocin and fundal massage. Baby at breast. Mom and baby in stable condition.  Ancef 2g IV given. Placenta sent to pathology due to IUGR    Anesthesia:  epidural Episiotomy: None Lacerations: bilateral superficial vaginal abrasions without repair Suture Repair: NA Est. Blood Loss (mL): 150  Mom to postpartum.  Baby to Couplet care / Skin to Skin.  Rod Can, CNM 02/25/2019, 4:35 PM

## 2018-05-25 ENCOUNTER — Encounter: Payer: Self-pay | Admitting: Obstetrics and Gynecology

## 2018-05-25 ENCOUNTER — Ambulatory Visit: Payer: Commercial Managed Care - PPO | Admitting: Obstetrics and Gynecology

## 2018-05-25 VITALS — BP 108/70 | HR 98 | Ht 60.0 in | Wt 185.0 lb

## 2018-05-25 DIAGNOSIS — Z30432 Encounter for removal of intrauterine contraceptive device: Secondary | ICD-10-CM

## 2018-05-25 DIAGNOSIS — Z3169 Encounter for other general counseling and advice on procreation: Secondary | ICD-10-CM | POA: Diagnosis not present

## 2018-05-25 DIAGNOSIS — R1032 Left lower quadrant pain: Secondary | ICD-10-CM

## 2018-05-25 NOTE — Patient Instructions (Signed)
I value your feedback and entrusting us with your care. If you get a [facility] patient survey, I would appreciate you taking the time to let us know about your experience today. Thank you! 

## 2018-05-25 NOTE — Progress Notes (Signed)
Llc, [facility]   Chief Complaint  Patient presents with  . IUD removal    has been having really bad ovary pain x 2 months ago, usually right before period started    HPI:      [name] is a 23 y.o. G1P1001 who LMP was Patient's last menstrual period was 05/11/2018 (approximate)., presents today for IUD removal. Liletta placed 03/19/15 post partum and pt would like to conceive again. She is not taking PNVs. Pt has had intermittent LLQ throbbing pain before last 2 periods that lasts about a day. Sx improved with lying down. Sx started again yesterday and she is not due for her period, so pt wants IUD removed. No hx of ovar cysts. No urin or vag sx. Pt with hx of IBS. Pt has monthly periods with IUD, lasting 3-4 days, no BTB, no dysmen. LMP was 8 days.  Pt is sex active, no new partners. No pain/bleeding with sex.  Pt has annual 05/31/18.  Past Medical History:  Diagnosis Date  . Anxiety   . Depressive disorder   . GERD (gastroesophageal reflux disease)   . IBS (irritable bowel syndrome)     Past Surgical History:  Procedure Laterality Date  . NO PAST SURGERIES      Family History  Problem Relation Age of Onset  . Breast cancer Other        mat aunt, mggm  . Hypertension Mother   . Hyperlipidemia Father   . Thyroid disease Maternal Grandmother   . Dementia Maternal Grandmother   . Heart attack Maternal Grandfather   . Diabetes Paternal Grandmother   . Cancer Paternal Grandfather        pancreatic  . Healthy Brother     Social History   Socioeconomic History  . Marital status: Single    Spouse name: Not on file  . Number of children: Not on file  . Years of education: Not on file  . Highest education level: Not on file  Occupational History  . Not on file  Social Needs  . Financial resource strain: Not on file  . Food insecurity:    Worry: Not on file    Inability: Not on file  . Transportation needs:    Medical: Not on file   Non-medical: Not on file  Tobacco Use  . Smoking status: Current Every Day Smoker    Types: Cigarettes  . Smokeless tobacco: Never Used  Substance and Sexual Activity  . Alcohol use: Yes    Comment: occ  . Drug use: Not Currently    Types: Marijuana  . Sexual activity: Yes    Birth control/protection: None    Comment: pt is trying to conceive  Lifestyle  . Physical activity:    Days per week: Not on file    Minutes per session: Not on file  . Stress: Not on file  Relationships  . Social connections:    Talks on phone: Not on file    Gets together: Not on file    Attends religious service: Not on file    Active member of club or organization: Not on file    Attends meetings of clubs or organizations: Not on file    Relationship status: Not on file  . Intimate partner violence:    Fear of current or ex partner: Not on file    Emotionally abused: Not on file    Physically abused: Not on file    Forced  sexual activity: Not on file  Other Topics Concern  . Not on file  Social History Narrative  . Not on file    Outpatient Medications Prior to Visit  Medication Sig Dispense Refill  . ALPRAZolam (XANAX) 0.25 MG tablet Take 1 tablet (0.25 mg total) by mouth 2 (two) times daily as needed for anxiety. 5 tablet 0  . citalopram (CELEXA) 10 MG tablet Take 1 tablet (10 mg total) by mouth daily. 90 tablet 3  . omeprazole (PRILOSEC) 20 MG capsule Take 1 capsule (20 mg total) by mouth daily. 90 capsule 3  . Levonorgestrel (LILETTA) 19.5 MCG/DAY IUD IUD by Intrauterine route.     No facility-administered medications prior to visit.       ROS:  Review of Systems  Constitutional: Negative for fever.  Gastrointestinal: Negative for blood in stool, constipation, diarrhea, nausea and vomiting.  Genitourinary: Positive for pelvic pain. Negative for dyspareunia, dysuria, flank pain, frequency, hematuria, urgency, vaginal bleeding, vaginal discharge and vaginal pain.  Musculoskeletal:  Negative for back pain.  Skin: Negative for rash.  Neurological: Positive for headaches.    OBJECTIVE:   Vitals:  BP 108/70   Pulse 98   Ht 5' (1.524 m)   Wt 185 lb (83.9 kg)   LMP 05/11/2018 (Approximate)   BMI 36.13 kg/m   Physical Exam Vitals signs reviewed.  Constitutional:      Appearance: She is well-developed.  Pulmonary:     Effort: Pulmonary effort is normal.  Genitourinary:    Pubic Area: No rash.      Labia:        Right: No rash, tenderness or lesion.        Left: Tenderness present. No rash or lesion.      Vagina: Normal. No vaginal discharge, erythema or tenderness.     Cervix: Normal.     Uterus: Normal. Not enlarged and not tender.      Adnexa: Right adnexa normal and left adnexa normal.       Right: No mass or tenderness.         Left: No mass or tenderness.       Comments: IUD STRINGS IN PLACE Musculoskeletal: Normal range of motion.  Neurological:     Mental Status: She is alert and oriented to person, place, and time.  Psychiatric:        Behavior: Behavior normal.        Thought Content: Thought content normal.    IUD Removal Strings of IUD identified and grasped.  IUD removed without problem with ring forceps.  Pt tolerated this well.  IUD noted to be intact.  Assessment:  LLQ pain - Slightly tender on exam. IUD removed today. Discussed ovar cysts and checking u/s. Pt has annual 05/31/18. Will sched u/s if pain persists. F/u prn.  Encounter for removal of intrauterine contraceptive device (IUD)  Pre-conception counseling - Start PNVs.    STD testing to be done routinely at annual.    Return if symptoms worsen or fail to improve.  Alicia B. Copland, PA-C 05/25/2018 2:39 PM

## 2018-05-31 ENCOUNTER — Encounter: Payer: Self-pay | Admitting: Certified Nurse Midwife

## 2018-05-31 ENCOUNTER — Ambulatory Visit (INDEPENDENT_AMBULATORY_CARE_PROVIDER_SITE_OTHER): Payer: Commercial Managed Care - PPO | Admitting: Certified Nurse Midwife

## 2018-05-31 ENCOUNTER — Other Ambulatory Visit (HOSPITAL_COMMUNITY)
Admission: RE | Admit: 2018-05-31 | Discharge: 2018-05-31 | Disposition: A | Discharge status: Home / Self Care | Payer: Commercial Managed Care - PPO | Source: Ambulatory Visit | Attending: Certified Nurse Midwife | Admitting: Certified Nurse Midwife

## 2018-05-31 VITALS — BP 120/78 | HR 92 | Ht 60.0 in | Wt 180.0 lb

## 2018-05-31 DIAGNOSIS — Z124 Encounter for screening for malignant neoplasm of cervix: Secondary | ICD-10-CM | POA: Diagnosis not present

## 2018-05-31 DIAGNOSIS — K219 Gastro-esophageal reflux disease without esophagitis: Secondary | ICD-10-CM | POA: Insufficient documentation

## 2018-05-31 DIAGNOSIS — Z01419 Encounter for gynecological examination (general) (routine) without abnormal findings: Secondary | ICD-10-CM

## 2018-05-31 NOTE — Progress Notes (Signed)
Gynecology Annual Exam  [facility]  Chief Complaint:  Chief Complaint  Patient presents with  . Gynecologic Exam    History of Present Illness: Debra Hinton is a 23 y.o. G1P1001 presents for annual exam. The patient has no complaints today.  Her menses have been regular on the [place]. They occur every month, and they last 3 days. Her flow is light to moderate. She has had some spotting since her LMP and after having her IUD removed 05/25/2018. Her last menstrual period was 05/11/2018.  Last pap smear: NA   The patient is sexually active. She currently uses nothing for contraception since having her IUD removed. She is wanting to conceive.  Since her last visit, she has had hyperthyroidism (?Graves disease). Her t4 level is now normal and her T3 is almost back to normal, but her TSH remains suppressed. She has not had any treatment for hyperthyroidism other than taking vitamin B12 and selenium supplements.   Her past medical history is remarkable for anxiety/ depression, GERD, and IBS.  The patient does perform self breast exams. Her last mammogram was NA.  There is a family history of breast cancer in her maternal aunt and MGGM. She is unsure whether genetic testing has been done.  There is no family history of ovarian cancer.   The patient reports smoking. She smokes 2-3 ciagettes a day. She did stop smoking with her prior pregnancy. packs per day.  She reports drinking alcohol. She reports have 2 drinks per month.   She denies illegal drug use.  The patient does not exercise.    Review of Systems: Review of Systems  Constitutional: Negative for chills, fever and weight loss.  HENT: Negative for congestion, sinus pain and sore throat.   Eyes: Negative for blurred vision and pain.  Respiratory: Negative for hemoptysis, shortness of breath and wheezing.   Cardiovascular: Negative for chest pain, palpitations and leg swelling.    Gastrointestinal: Positive for constipation, diarrhea, nausea and vomiting. Negative for abdominal pain, blood in stool and heartburn.  Genitourinary: Negative for dysuria, frequency, hematuria and urgency.  Musculoskeletal: Negative for back pain, joint pain and myalgias.  Skin: Negative for itching and rash.  Neurological: Positive for dizziness and headaches. Negative for tingling.  Endo/Heme/Allergies: Negative for environmental allergies and polydipsia. Does not bruise/bleed easily.       Negative for hirsutism Positive for hot flashes  Psychiatric/Behavioral: Positive for depression. The patient is nervous/anxious. The patient does not have insomnia.     Past Medical History:  Past Medical History:  Diagnosis Date  . Anxiety   . Depressive disorder   . GERD (gastroesophageal reflux disease)   . Hyperthyroidism 2019   Possible Graves disease  . IBS (irritable bowel syndrome)     Past Surgical History:  Past Surgical History:  Procedure Laterality Date  . NO PAST SURGERIES      Family History:  Family History  Problem Relation Age of Onset  . Breast cancer Other         mggm  . Hypertension Mother   . Hyperlipidemia Father   . Thyroid disease Maternal Grandmother   . Dementia Maternal Grandmother   . Heart attack Maternal Grandfather   . Diabetes Paternal Grandmother   . Alcoholism Paternal Grandmother   . Cancer Paternal Grandfather        pancreatic  . Healthy Brother   . Hypothyroidism Maternal Aunt   . Hypothyroidism Maternal Aunt   .  Breast cancer Maternal Aunt        30s    Social History:  Social History   Socioeconomic History  . Marital status: Significant Other    Spouse name: Not on file  . Number of children: Not on file  . Years of education: Not on file  . Highest education level: Not on file  Occupational History  . Not on file  Social Needs  . Financial resource strain: Not on file  . Food insecurity:    Worry: Not on file     Inability: Not on file  . Transportation needs:    Medical: Not on file    Non-medical: Not on file  Tobacco Use  . Smoking status: Current Every Day Smoker    Packs/day: 0.25    Types: Cigarettes  . Smokeless tobacco: Never Used  Substance and Sexual Activity  . Alcohol use: Yes    Comment: occ  . Drug use: Not Currently    Types: Marijuana  . Sexual activity: Yes    Birth control/protection: None    Comment: pt is trying to conceive  Lifestyle  . Physical activity:    Days per week: Not on file    Minutes per session: Not on file  . Stress: Not on file  Relationships  . Social connections:    Talks on phone: Not on file    Gets together: Not on file    Attends religious service: Not on file    Active member of club or organization: Not on file    Attends meetings of clubs or organizations: Not on file    Relationship status: Not on file  . Intimate partner violence:    Fear of current or ex partner: Not on file    Emotionally abused: Not on file    Physically abused: Not on file    Forced sexual activity: Not on file  Other Topics Concern  . Not on file  Social History Narrative  . Not on file    Allergies:  No Known Allergies  Medications: Prior to Admission medications   Medication Sig Start Date End Date Taking? Authorizing Provider  ALPRAZolam (XANAX) 0.25 MG tablet Take 1 tablet (0.25 mg total) by mouth 2 (two) times daily as needed for anxiety. 05/04/18  Yes [place], Coralee NorthAdam J, NP  citalopram (CELEXA) 10 MG tablet Take 1 tablet (10 mg total) by mouth daily. 02/22/18  Yes Boscia, Kathlynn GrateHeather E, NP  omeprazole (PRILOSEC) 20 MG capsule Take 1 capsule (20 mg total) by mouth daily. 02/22/18  Yes Boscia, Kathlynn GrateHeather E, NP  Prenatal Vit-Fe Fumarate-FA (MULTIVITAMIN-PRENATAL) 27-0.8 MG TABS tablet Take 1 tablet by mouth daily at 12 noon.   Yes [provider]  Selenium 200 mcg/ day Vitamin B12 50 mcg daily  Physical Exam Vitals: BP 120/78   Pulse 92   Ht 5'  (1.524 m)   Wt 180 lb (81.6 kg)   LMP 05/11/2018 (Approximate)   BMI 35.15 kg/m  General: Pleasant WF in  NAD HEENT: normocephalic, anicteric Neck: no thyroid enlargement, no palpable nodules, no cervical lymphadenopathy  Pulmonary: No increased work of breathing, CTAB Cardiovascular: RRR, without murmur  Breast: Breast symmetrical, no tenderness, no palpable nodules or masses, no skin or nipple retraction present, no nipple discharge.  No axillary, infraclavicular or supraclavicular lymphadenopathy. Abdomen: Soft, non-tender, non-distended.  Umbilicus without lesions.  No hepatomegaly or masses palpable. No evidence of hernia. Genitourinary:  External: Normal external female genitalia.  Normal urethral meatus, normal  Bartholin's and Skene's glands.    Vagina: Normal vaginal mucosa, no evidence of prolapse, scant blood.    Cervix: Grossly normal in appearance, no bleeding, non-tender  Uterus: Anteverted, normal size, shape, and consistency, mobile, and non-tender  Adnexa: No adnexal masses, non-tender  Rectal: deferred  Lymphatic: no evidence of inguinal lymphadenopathy Extremities: no edema, erythema, or tenderness Neurologic: Grossly intact Psychiatric: mood appropriate, affect full     Assessment: 23 y.o. G1P1001 normal gyn exam Family history of breast cancer  Plan:   1) Breast cancer screening - recommend monthly self breast exam. Patient to check to see if maternal aunt had genetic testing. Patient does qualify for testing if desires.   2) Cervical cancer screening - Pap was done. ASCCP guidelines and rational discussed.  Patient opts for yearly screening interval  3) Contraception - desires to conceive. Plans on stopping smoking when conceives.Continue prenatal vitamins  4) RTO 1 year and prn.  Farrel Conners, CNM

## 2018-05-31 NOTE — Addendum Note (Signed)
Addended by: Farrel ConnersGUTIERREZ, Ellianah Cordy on: 05/31/2018 02:00 PM   Modules accepted: Orders

## 2018-06-02 LAB — CYTOLOGY - PAP
Chlamydia: NEGATIVE
Diagnosis: NEGATIVE
Neisseria Gonorrhea: NEGATIVE

## 2018-06-07 ENCOUNTER — Telehealth: Payer: Self-pay | Admitting: Certified Nurse Midwife

## 2018-06-07 NOTE — Telephone Encounter (Signed)
Called and advised of normal Pap smear. Farrel Conners, CNM

## 2018-06-07 NOTE — Telephone Encounter (Signed)
Patient is calling for labs results. Please advise. 

## 2018-06-17 ENCOUNTER — Emergency Department
Admission: EM | Admit: 2018-06-17 | Discharge: 2018-06-17 | Disposition: A | Discharge status: Home / Self Care | Payer: Commercial Managed Care - PPO | Attending: Emergency Medicine | Admitting: Emergency Medicine

## 2018-06-17 ENCOUNTER — Encounter: Payer: Self-pay | Admitting: Emergency Medicine

## 2018-06-17 ENCOUNTER — Telehealth: Payer: Commercial Managed Care - PPO | Admitting: Nurse Practitioner

## 2018-06-17 ENCOUNTER — Other Ambulatory Visit: Payer: Self-pay

## 2018-06-17 DIAGNOSIS — N3000 Acute cystitis without hematuria: Secondary | ICD-10-CM

## 2018-06-17 DIAGNOSIS — N12 Tubulo-interstitial nephritis, not specified as acute or chronic: Secondary | ICD-10-CM | POA: Insufficient documentation

## 2018-06-17 DIAGNOSIS — F17200 Nicotine dependence, unspecified, uncomplicated: Secondary | ICD-10-CM | POA: Insufficient documentation

## 2018-06-17 DIAGNOSIS — Z79899 Other long term (current) drug therapy: Secondary | ICD-10-CM | POA: Insufficient documentation

## 2018-06-17 DIAGNOSIS — N10 Acute tubulo-interstitial nephritis: Secondary | ICD-10-CM | POA: Diagnosis not present

## 2018-06-17 DIAGNOSIS — R300 Dysuria: Secondary | ICD-10-CM | POA: Diagnosis present

## 2018-06-17 HISTORY — DX: Anxiety disorder, unspecified: F41.9

## 2018-06-17 HISTORY — DX: Irritable bowel syndrome, unspecified: K58.9

## 2018-06-17 HISTORY — DX: Major depressive disorder, single episode, unspecified: F32.9

## 2018-06-17 HISTORY — DX: Disorder of thyroid, unspecified: E07.9

## 2018-06-17 HISTORY — DX: Depression, unspecified: F32.A

## 2018-06-17 LAB — POCT PREGNANCY, URINE: Preg Test, Ur: NEGATIVE

## 2018-06-17 LAB — URINALYSIS, ROUTINE W REFLEX MICROSCOPIC
Bacteria, UA: NONE SEEN
Bilirubin Urine: NEGATIVE
Glucose, UA: NEGATIVE mg/dL
Hgb urine dipstick: NEGATIVE
Ketones, ur: 5 mg/dL — AB
Leukocytes, UA: NEGATIVE
Nitrite: POSITIVE — AB
PH: 6 (ref 5.0–8.0)
Protein, ur: NEGATIVE mg/dL
SPECIFIC GRAVITY, URINE: 1.013 (ref 1.005–1.030)

## 2018-06-17 MED ORDER — CEFTRIAXONE SODIUM 1 G IJ SOLR
1.0000 g | Freq: Once | INTRAMUSCULAR | Status: AC
  Administered 2018-06-17: 1 g via INTRAMUSCULAR
  Filled 2018-06-17: qty 10

## 2018-06-17 MED ORDER — CEPHALEXIN 500 MG PO CAPS: 500.0000 mg | ORAL_CAPSULE | Freq: Three times a day (TID) | ORAL | 0 refills | Status: AC

## 2018-06-17 NOTE — ED Notes (Signed)
Pt reports dysuria since Wednesday; taking AZO which is intermittently helping her burning; pressure pain to mid and lower back; tenderness on palpation to right and left mid back; pt denies fevers at home; lungs clear

## 2018-06-17 NOTE — ED Provider Notes (Signed)
[facility] Emergency Department Provider Note  ____________________________________________  Time seen: Approximately 9:38 PM  I have reviewed the triage vital signs and the nursing notes.   HISTORY  Chief Complaint Dysuria    HPI Debra Hinton is a 23 y.o. female presents to the emergency department with dysuria, suprapubic pain, back pain and nausea for the past 4 days.  Patient reports that she recently had her IUD removed.  She denies a history of cystitis or pyelonephritis.  No changes in vaginal discharge and absolutely no concerns for STDs.  No dyspareunia.  Patient has been taking Azo which has improved her dysuria.  She has been afebrile but has had chills.  No other alleviating measures have been attempted.   Past Medical History:  Diagnosis Date  . Anxiety and depression   . Irritable bowel syndrome (IBS)   . Thyroid disease     There are no active problems to display for this patient.   History reviewed. No pertinent surgical history.  Prior to Admission medications   Medication Sig Start Date End Date Taking? Authorizing Provider  citalopram (CELEXA) 10 MG tablet Take 10 mg by mouth daily.   Yes [provider]  omeprazole (PRILOSEC) 40 MG capsule Take 40 mg by mouth daily.   Yes [provider]  Prenatal Vit-Fe Fumarate-FA (PRENATAL MULTIVITAMIN) TABS tablet Take 1 tablet by mouth daily at 12 noon.   Yes [provider]  selenium 50 MCG TABS tablet Take 200 mcg by mouth every other day.   Yes [provider]  vitamin B-12 (CYANOCOBALAMIN) 500 MCG tablet Take 500 mcg by mouth daily.   Yes [provider]  cephALEXin (KEFLEX) 500 MG capsule Take 1 capsule (500 mg total) by mouth 3 (three) times daily for 7 days. 06/17/18 06/24/18  Orvil Feil, PA-C    Allergies Patient has no known allergies.  History reviewed. No pertinent family history.  Social History Social History   Tobacco Use  .  Smoking status: Current Some Day Smoker  . Smokeless tobacco: Never Used  Substance Use Topics  . Alcohol use: Yes  . Drug use: Never     Review of Systems  Constitutional: No fever/chills Eyes: No visual changes. No discharge ENT: No upper respiratory complaints. Cardiovascular: no chest pain. Respiratory: no cough. No SOB. Gastrointestinal: Patient has suprapubic pain.  No nausea, no vomiting.  No diarrhea.  No constipation. Genitourinary: Patient has dysuria.  Patient has flank pain. Musculoskeletal: Negative for musculoskeletal pain. Skin: Negative for rash, abrasions, lacerations, ecchymosis. Neurological: Negative for headaches, focal weakness or numbness.   ____________________________________________   PHYSICAL EXAM:  VITAL SIGNS: ED Triage Vitals [06/17/18 2002]  Enc Vitals Group     BP (!) 142/86     Pulse Rate 93     Resp 18     Temp 98.5 F (36.9 C)     Temp Source Oral     SpO2 99 %     Weight 180 lb (81.6 kg)     Height 5' (1.524 m)     Head Circumference      Peak Flow      Pain Score 6     Pain Loc      Pain Edu?      Excl. in GC?      Constitutional: Alert and oriented. Well appearing and in no acute distress. Eyes: Conjunctivae are normal. PERRL. EOMI. Head: Atraumatic. Cardiovascular: Normal rate, regular rhythm. Normal S1 and S2.  Good peripheral circulation. Respiratory: Normal respiratory effort without tachypnea or retractions. Lungs CTAB. Good air entry to the bases with no decreased or absent breath sounds. Gastrointestinal: Bowel sounds 4 quadrants.  Patient has suprapubic pain to palpation.  No guarding or rigidity. No palpable masses. No distention.  Patient has CVA tenderness. Musculoskeletal: Full range of motion to all extremities. No gross deformities appreciated. Neurologic:  Normal speech and language. No gross focal neurologic deficits are appreciated.  Skin:  Skin is warm, dry and intact. No rash noted. Psychiatric: Mood and  affect are normal. Speech and behavior are normal. Patient exhibits appropriate insight and judgement.   ____________________________________________   LABS (all labs ordered are listed, but only abnormal results are displayed)  Labs Reviewed  URINALYSIS, ROUTINE W REFLEX MICROSCOPIC - Abnormal; Notable for the following components:      Result Value   Color, Urine AMBER (*)    APPearance CLEAR (*)    Ketones, ur 5 (*)    Nitrite POSITIVE (*)    All other components within normal limits  POCT PREGNANCY, URINE  POC URINE PREG, ED   ____________________________________________  EKG   ____________________________________________  RADIOLOGY   No results found.  ____________________________________________    PROCEDURES  Procedure(s) performed:    Procedures    Medications  cefTRIAXone (ROCEPHIN) injection 1 g (1 g Intramuscular Given 06/17/18 2134)     ____________________________________________   INITIAL IMPRESSION / ASSESSMENT AND PLAN / ED COURSE  Pertinent labs & imaging results that were available during my care of the patient were reviewed by me and considered in my medical decision making (see chart for details).  Review of the [place] CSRS was performed in accordance of the [facility] prior to dispensing any controlled drugs.    Assessment and plan Pyelonephritis Patient presents to the emergency department with dysuria, flank pain, chills and nausea for the past 4 days.  Patient's vital signs remained reassuring in the emergency department.  Patient was given Rocephin.  She was discharged with Keflex.  Strict return precautions were given to return to the emergency department for new or worsening symptoms.  All patient questions were answered.    ____________________________________________  FINAL CLINICAL IMPRESSION(S) / ED DIAGNOSES  Final diagnoses:  Pyelonephritis      NEW MEDICATIONS STARTED DURING THIS VISIT:  ED Discharge Orders          Ordered    cephALEXin (KEFLEX) 500 MG capsule  3 times daily     06/17/18 2126              This chart was dictated using voice recognition software/Dragon. Despite best efforts to proofread, errors can occur which can change the meaning. Any change was purely unintentional.    Orvil Feil, PA-C 06/17/18 2141    Nita Sickle, MD 06/18/18 [phone]

## 2018-06-17 NOTE — Progress Notes (Signed)
Based on what you shared with me it looks like you have uti or pyelonephritis, due to back pain.,that should be evaluated in a face to face office visit. You will need to go to urgent care or see your PCP because you need a urine culture.   NOTE: If you entered your credit card information for this eVisit, you will not be charged. You may see a "hold" on your card for the $30 but that hold will drop off and you will not have a charge processed.  If you are having a true medical emergency please call 911.  If you need an urgent face to face visit, [place] has four urgent care centers for your convenience.  If you need care fast and have a high deductible or no insurance consider:   [email or web address] to reserve your spot online an avoid wait times  [facility] [address], [address] [place], [place] [address] 8 am to 8 pm Monday-Friday 10 am to 4 pm Saturday-Sunday *Across the street from [employer]  [address] [place], [address] 8 am to 5 pm Monday-Friday * In the [facility] on the [facility]   The following sites will take your  insurance:  . [facility]  [phone] Get Driving Directions Find a Provider at this Location  [address] [place], [place] [address] . 10 am to 8 pm Monday-Friday . 12 pm to 8 pm Saturday-Sunday   . [facility] at [facility]  [phone] Get Driving Directions Find a Provider at this Location  [address] [place], [place] [address] . 8 am to 8 pm Monday-Friday . 9 am to 6 pm Saturday . 11 am to 6 pm Sunday   . [facility] at [facility]  [phone] Get Driving Directions  [address] [place], [place] [address] . 8 am to 8 pm Monday-Friday . 8 am to 4 pm Saturday-Sunday   Your e-visit answers were reviewed by a board certified advanced clinical practitioner to complete  your personal care plan.

## 2018-06-17 NOTE — ED Triage Notes (Signed)
Reports dysuria and urinary pressure.  Symptoms since Wednesday night.

## 2018-06-19 ENCOUNTER — Encounter: Payer: Self-pay | Admitting: Certified Nurse Midwife

## 2018-06-23 NOTE — ED Notes (Signed)
Patient called and said she was supposed to have rx for diflucan and it was not at pharmacy.  Per dr Cyril Loosen can call in fluconazole 150 mg po now and 150 mg po in 72 hours.  Called in to [facility] [address].

## 2018-07-11 ENCOUNTER — Ambulatory Visit: Payer: Commercial Managed Care - PPO | Admitting: Internal Medicine

## 2018-07-12 ENCOUNTER — Encounter: Payer: Commercial Managed Care - PPO | Admitting: Maternal Newborn

## 2018-07-17 ENCOUNTER — Telehealth: Payer: Self-pay

## 2018-07-17 ENCOUNTER — Other Ambulatory Visit: Payer: Self-pay | Admitting: Certified Nurse Midwife

## 2018-07-17 MED ORDER — METOCLOPRAMIDE HCL 10 MG PO TABS: 10.0000 mg | ORAL_TABLET | Freq: Three times a day (TID) | ORAL | 1 refills | Status: DC

## 2018-07-17 MED ORDER — PROMETHAZINE HCL 12.5 MG PO TABS: 12.5000 mg | ORAL_TABLET | Freq: Four times a day (QID) | ORAL | 1 refills | Status: DC | PRN

## 2018-07-17 NOTE — Telephone Encounter (Signed)
Pt calling for rx for something to keep nausea at bay.  Something better than diclegis.  Last preg nausea was really bad.  [phone]

## 2018-07-17 NOTE — Telephone Encounter (Signed)
Devona called. THinks she may be 6-[redacted] weeks pregnant. DIclegis is expensive. Has appointment for NOB soon. Nausea just starting now. Will send in RX for Reglan 10 mgm before meals and Phenergan for breakthrough nausea Farrel Conners, CNM

## 2018-07-18 ENCOUNTER — Ambulatory Visit: Payer: Commercial Managed Care - PPO | Admitting: Internal Medicine

## 2018-07-20 ENCOUNTER — Ambulatory Visit (INDEPENDENT_AMBULATORY_CARE_PROVIDER_SITE_OTHER): Payer: Commercial Managed Care - PPO | Admitting: Obstetrics and Gynecology

## 2018-07-20 ENCOUNTER — Other Ambulatory Visit (HOSPITAL_COMMUNITY)
Admission: RE | Admit: 2018-07-20 | Discharge: 2018-07-20 | Disposition: A | Discharge status: Home / Self Care | Payer: Commercial Managed Care - PPO | Source: Ambulatory Visit | Attending: Obstetrics and Gynecology | Admitting: Obstetrics and Gynecology

## 2018-07-20 ENCOUNTER — Encounter: Payer: Self-pay | Admitting: Obstetrics and Gynecology

## 2018-07-20 VITALS — BP 110/66 | Wt 184.0 lb

## 2018-07-20 DIAGNOSIS — O99210 Obesity complicating pregnancy, unspecified trimester: Secondary | ICD-10-CM

## 2018-07-20 DIAGNOSIS — N912 Amenorrhea, unspecified: Secondary | ICD-10-CM

## 2018-07-20 DIAGNOSIS — Z113 Encounter for screening for infections with a predominantly sexual mode of transmission: Secondary | ICD-10-CM

## 2018-07-20 DIAGNOSIS — Z3689 Encounter for other specified antenatal screening: Secondary | ICD-10-CM

## 2018-07-20 DIAGNOSIS — Z3480 Encounter for supervision of other normal pregnancy, unspecified trimester: Secondary | ICD-10-CM | POA: Diagnosis present

## 2018-07-20 DIAGNOSIS — Z3201 Encounter for pregnancy test, result positive: Secondary | ICD-10-CM | POA: Diagnosis not present

## 2018-07-20 DIAGNOSIS — Z3687 Encounter for antenatal screening for uncertain dates: Secondary | ICD-10-CM

## 2018-07-20 DIAGNOSIS — O0990 Supervision of high risk pregnancy, unspecified, unspecified trimester: Secondary | ICD-10-CM | POA: Insufficient documentation

## 2018-07-20 DIAGNOSIS — Z3A01 Less than 8 weeks gestation of pregnancy: Secondary | ICD-10-CM

## 2018-07-20 LAB — POCT URINE PREGNANCY: Preg Test, Ur: POSITIVE — AB

## 2018-07-20 NOTE — Progress Notes (Signed)
NOB Unsure LMP IUD removed 1/8

## 2018-07-20 NOTE — Progress Notes (Signed)
New Obstetric Patient H&P    Chief Complaint: "Desires prenatal care"   History of Present Illness: Patient is a 23 y.o. G2P1001 Not Hispanic or Latino female, presents with amenorrhea and positive home pregnancy test. Patient's last menstrual period was 05/31/2018. and based on her  LMP, her EDD is Estimated Date of Delivery: 03/07/19 and her EGA is [redacted]w[redacted]d. Unsure LMP as had some spotting after removal of Mirena 05/31/2018 no true cycle.  Her last pap smear was 05/31/2018 and was no abnormalities.    Since her LMP she claims she has experienced nausea. She denies vaginal bleeding. Her past medical history is contibutory history of anxiety, depression. Her prior pregnancies are notable for none  Since her LMP, she admits to the use of tobacco products  no There are cats in the home in the home  no but works with animal She admits close contact with children on a regular basis  yes  She has had chicken pox in the past yes She has had Tuberculosis exposures, symptoms, or previously tested positive for TB   no Current or past history of domestic violence. no  Genetic Screening/Teratology Counseling: (Includes patient, baby's father, or anyone in either family with:)   1. Patient's age >/= 3 at [facility]  no 2. Thalassemia ([place], [place], Mediterranean, or Asian background): MCV<80  no 3. Neural tube defect (meningomyelocele, spina bifida, anencephaly)  no 4. Congenital heart defect  no  5. Down syndrome  no 6. Tay-Sachs (Jewish, [place]  no 7. Canavan's Disease  no 8. Sickle cell disease or trait (African)  no  9. Hemophilia or other blood disorders  no  10. Muscular dystrophy  no  11. Cystic fibrosis  no  12. Huntington's Chorea  no  13. Mental retardation/autism  no 14. Other inherited genetic or chromosomal disorder  no 15. Maternal metabolic disorder (DM, PKU, etc)  no 16. Patient or FOB with a child with a birth defect not listed above no  16a. Patient or FOB with a birth  defect themselves no 17. Recurrent pregnancy loss, or stillbirth  no  18. Any medications since LMP other than prenatal vitamins (include vitamins, supplements, OTC meds, drugs, alcohol)  no 19. Any other genetic/environmental exposure to discuss  no  Infection History:   1. Lives with someone with TB or TB exposed  no  2. Patient or partner has history of genital herpes  no 3. Rash or viral illness since LMP  no 4. History of STI (GC, CT, HPV, syphilis, HIV)  no 5. History of recent travel :  no  Other pertinent information:  no     Review of Systems:10 point review of systems negative unless otherwise noted in HPI  Past Medical History:  Past Medical History:  Diagnosis Date  . Anxiety   . Anxiety and depression   . Depressive disorder   . GERD (gastroesophageal reflux disease)   . Hyperthyroidism 2019   Possible Graves disease  . IBS (irritable bowel syndrome)   . Irritable bowel syndrome (IBS)   . Thyroid disease     Past Surgical History:  Past Surgical History:  Procedure Laterality Date  . NO PAST SURGERIES      Gynecologic History: Patient's last menstrual period was 05/31/2018.  Obstetric History: G2P1001  Family History:  Family History  Problem Relation Age of Onset  . Breast cancer Other         mggm  . Hypertension Mother   . Hyperlipidemia Father   .  Thyroid disease Maternal Grandmother   . Dementia Maternal Grandmother   . Heart attack Maternal Grandfather   . Diabetes Paternal Grandmother   . Alcoholism Paternal Grandmother   . Cancer Paternal Grandfather        pancreatic  . Healthy Brother   . Hypothyroidism Maternal Aunt   . Hypothyroidism Maternal Aunt   . Breast cancer Maternal Aunt        30s    Social History:  Social History   Socioeconomic History  . Marital status: Single    Spouse name: Jill Alexanders  . Number of children: 1  . Years of education: Not on file  . Highest education level: Not on file  Occupational History  .  Not on file  Social Needs  . Financial resource strain: Not on file  . Food insecurity:    Worry: Not on file    Inability: Not on file  . Transportation needs:    Medical: Not on file    Non-medical: Not on file  Tobacco Use  . Smoking status: Current Some Day Smoker  . Smokeless tobacco: Never Used  Substance and Sexual Activity  . Alcohol use: Yes    Comment: 2 drinks/month  . Drug use: Never    Types: Marijuana  . Sexual activity: Yes    Birth control/protection: None    Comment: pt is trying to conceive  Lifestyle  . Physical activity:    Days per week: Not on file    Minutes per session: Not on file  . Stress: Not on file  Relationships  . Social connections:    Talks on phone: Not on file    Gets together: Not on file    Attends religious service: Not on file    Active member of club or organization: Not on file    Attends meetings of clubs or organizations: Not on file    Relationship status: Not on file  . Intimate partner violence:    Fear of current or ex partner: Not on file    Emotionally abused: Not on file    Physically abused: Not on file    Forced sexual activity: Not on file  Other Topics Concern  . Not on file  Social History Narrative   ** Merged History Encounter **        Allergies:  No Known Allergies  Medications: Prior to Admission medications   Medication Sig Start Date End Date Taking? Authorizing Provider  metoCLOPramide (REGLAN) 10 MG tablet Take 1 tablet (10 mg total) by mouth 3 (three) times daily before meals. 07/17/18  Yes Farrel Conners, CNM  Prenatal Vit-Fe Fumarate-FA (MULTIVITAMIN-PRENATAL) 27-0.8 MG TABS tablet Take 1 tablet by mouth daily at 12 noon.   Yes [provider]  promethazine (PHENERGAN) 12.5 MG tablet Take 1 tablet (12.5 mg total) by mouth every 6 (six) hours as needed for refractory nausea / vomiting. 07/17/18  Yes Farrel Conners, CNM  selenium 50 MCG TABS tablet Take 200 mcg by mouth every other  day.   Yes [provider]  ALPRAZolam (XANAX) 0.25 MG tablet Take 1 tablet (0.25 mg total) by mouth 2 (two) times daily as needed for anxiety. Patient not taking: Reported on 07/20/2018 05/04/18   Johnna Acosta, NP  citalopram (CELEXA) 10 MG tablet Take 1 tablet (10 mg total) by mouth daily. Patient not taking: Reported on 07/20/2018 02/22/18   Carlean Jews, NP  omeprazole (PRILOSEC) 20 MG capsule Take 1 capsule (20 mg total)  by mouth daily. Patient not taking: Reported on 07/20/2018 02/22/18   Carlean JewsBoscia, Heather E, NP  omeprazole (PRILOSEC) 40 MG capsule Take 40 mg by mouth daily.    [provider]  vitamin B-12 (CYANOCOBALAMIN) 50 MCG tablet Take 50 mcg by mouth daily.    [provider]  vitamin B-12 (CYANOCOBALAMIN) 500 MCG tablet Take 500 mcg by mouth daily.    [provider]    Physical Exam Vitals: Blood pressure 110/66, weight 184 lb (83.5 kg), last menstrual period 05/31/2018.  General: NAD HEENT: normocephalic, anicteric Thyroid: no enlargement, no palpable nodules Pulmonary: No increased work of breathing, CTAB Cardiovascular: RRR, distal pulses 2+ Abdomen: NABS, soft, non-tender, non-distended.  Umbilicus without lesions.  No hepatomegaly, splenomegaly or masses palpable. No evidence of hernia  Genitourinary:  External: Normal external female genitalia.  Normal urethral meatus, normal  Bartholin's and Skene's glands.    Vagina: Normal vaginal mucosa, no evidence of prolapse.    Cervix: Grossly normal in appearance, no bleeding  Uterus:  Non-enlarged, mobile, normal contour.  No CMT  Adnexa: ovaries non-enlarged, no adnexal masses  Rectal: deferred Extremities: no edema, erythema, or tenderness Neurologic: Grossly intact Psychiatric: mood appropriate, affect full   Assessment: 23 y.o. G2P1001 at 4153w1d presenting to initiate prenatal care  Plan: 1) Avoid alcoholic beverages. 2) Patient encouraged not to smoke.  3) Discontinue the  use of all non-medicinal drugs and chemicals.  4) Take prenatal vitamins daily.  5) Nutrition, food safety (fish, cheese advisories, and high nitrite foods) and exercise discussed. 6) Hospital and practice style discussed with cross coverage system.  7) Genetic Screening, such as with 1st Trimester Screening, cell free fetal DNA, AFP testing, and Ultrasound, as well as with amniocentesis and CVS as appropriate, is discussed with patient. At the conclusion of today's visit patient requested genetic testing 8) Patient is asked about travel to areas at risk for the Zika virus, and counseled to avoid travel and exposure to mosquitoes or sexual partners who may have themselves been exposed to the virus. Testing is discussed, and will be ordered as appropriate.   Vena AustriaAndreas Trisha Morandi, MD, Merlinda FrederickFACOG [facility], [facility] 07/20/2018, 10:37 AM

## 2018-07-21 ENCOUNTER — Telehealth: Payer: Self-pay

## 2018-07-21 LAB — RPR+RH+ABO+RUB AB+AB SCR+CB...
HIV Screen 4th Generation wRfx: NONREACTIVE
Hematocrit: 41.6 % (ref 34.0–46.6)
Hemoglobin: 13.9 g/dL (ref 11.1–15.9)
Hepatitis B Surface Ag: NEGATIVE
MCH: 29.2 pg (ref 26.6–33.0)
MCHC: 33.4 g/dL (ref 31.5–35.7)
MCV: 87 fL (ref 79–97)
Platelets: 247 10*3/uL (ref 150–450)
RBC: 4.76 x10E6/uL (ref 3.77–5.28)
RDW: 12.9 % (ref 11.7–15.4)
RH TYPE: POSITIVE
RPR Ser Ql: NONREACTIVE
Rubella Antibodies, IGG: 7.32 index (ref >0.99)
Varicella zoster IgG: 769 index (ref >165)
WBC: 7 10*3/uL (ref 3.4–10.8)

## 2018-07-21 LAB — BETA HCG QUANT (REF LAB): hCG Quant: 54529 m[IU]/mL

## 2018-07-21 LAB — CERVICOVAGINAL ANCILLARY ONLY
Chlamydia: NEGATIVE
NEISSERIA GONORRHEA: NEGATIVE
Trichomonas: NEGATIVE

## 2018-07-21 LAB — AB SCR+ANTIBODY ID

## 2018-07-21 NOTE — Telephone Encounter (Signed)
Pt calling for blood test results.  [phone]  Adv normal.  Pt wants them released so she can she the levels.

## 2018-07-22 LAB — URINE CULTURE

## 2018-07-25 ENCOUNTER — Ambulatory Visit (INDEPENDENT_AMBULATORY_CARE_PROVIDER_SITE_OTHER): Payer: Commercial Managed Care - PPO | Admitting: Internal Medicine

## 2018-07-25 ENCOUNTER — Other Ambulatory Visit: Payer: Self-pay

## 2018-07-25 ENCOUNTER — Encounter: Payer: Self-pay | Admitting: Internal Medicine

## 2018-07-25 VITALS — BP 118/72 | HR 96 | Ht 60.0 in | Wt 179.0 lb

## 2018-07-25 DIAGNOSIS — E0590 Thyrotoxicosis, unspecified without thyrotoxic crisis or storm: Secondary | ICD-10-CM | POA: Diagnosis not present

## 2018-07-25 DIAGNOSIS — E0500 Thyrotoxicosis with diffuse goiter without thyrotoxic crisis or storm: Secondary | ICD-10-CM

## 2018-07-25 DIAGNOSIS — Z3A08 8 weeks gestation of pregnancy: Secondary | ICD-10-CM

## 2018-07-25 LAB — URINE DRUG PANEL 7
Amphetamines, Urine: NEGATIVE ng/mL
Barbiturate Quant, Ur: NEGATIVE ng/mL
Benzodiazepine Quant, Ur: NEGATIVE ng/mL
COCAINE (METAB.): NEGATIVE ng/mL
Cannabinoid Quant, Ur: POSITIVE — AB
Opiate Quant, Ur: NEGATIVE ng/mL
PCP Quant, Ur: NEGATIVE ng/mL

## 2018-07-25 LAB — TSH: TSH: <0.01 u[IU]/mL — ABNORMAL LOW (ref 0.35–4.50)

## 2018-07-25 NOTE — Patient Instructions (Signed)
-   Stop by the lab to have a recheck on thyroid function.  - Will contact you if we need to change any treatment.

## 2018-07-25 NOTE — Progress Notes (Signed)
Name: Debra Hinton  MRN/ DOB: [id number], 1996/03/25    Age/ Sex: 23 y.o., female     PCP: Patient, No Pcp Per   Reason for Endocrinology Evaluation: Hyperthyroidism      Initial Endocrinology Clinic Visit: 04/11/2019    PATIENT IDENTIFIER: Debra Hinton is a 23 y.o., female with a past medical history of IBS, GAD and graves' disease. She has followed with [facility] since 04/11/2019 for consultative assistance with management of her Hyperthyroidism.   HISTORICAL SUMMARY:  [name], 2019 with c/o constipation alternating with diarrhea as well as anxiety and weight gain. Her Labs revealed a suppressed  TSH at < 0.006 uIU/mL and elevated FT4 and T3. A thyroid ultrasound was unrevealing.   On her initial visit to our office in November, 2020 her TSH continued to be low but with normalization of her FT4. We decided to withhold therapy due to improvement in her 23    FH with thyroid disease.  SUBJECTIVE:    Today (07/25/2018):  Debra Hinton is here for a follow up on her graves' disease. She was diagnosed with this in November, 2019. She was advised against pregnancy at that time , she missed her 6 week follow up.   Today she is 8 weeks and 2 days. She is not sure of her LMP as she had intermittent spotting.   She has weight loss recently, she has been sick with nausea.  She denies diarrhea, palpitations, heat intolerance  She denies anxiety or jittery sensations.   She denies tremors.  No local neck symptoms.     ROS:  As per HPI.   HISTORY:  Past Medical History:  Past Medical History:  Diagnosis Date  . Anxiety   . Anxiety and depression   . Depressive disorder   . GERD (gastroesophageal reflux disease)   . Hyperthyroidism 2019   Possible Graves disease  . IBS (irritable bowel syndrome)   . Irritable bowel syndrome (IBS)   . Thyroid disease    Past Surgical History:  Past  Surgical History:  Procedure Laterality Date  . NO PAST SURGERIES      Social History:  reports that she quit smoking about 3 weeks ago. She has never used smokeless tobacco. She reports current alcohol use. She reports that she does not use drugs. Family History:  Family History  Problem Relation Age of Onset  . Breast cancer Other         mggm  . Hypertension Mother   . Hyperlipidemia Father   . Thyroid disease Maternal Grandmother   . Dementia Maternal Grandmother   . Heart attack Maternal Grandfather   . Diabetes Paternal Grandmother   . Alcoholism Paternal Grandmother   . Cancer Paternal Grandfather        pancreatic  . Healthy Brother   . Hypothyroidism Maternal Aunt   . Hypothyroidism Maternal Aunt   . Breast cancer Maternal Aunt        30s     HOME MEDICATIONS: Allergies as of 07/25/2018   No Known Allergies     Medication List       Accurate as of July 25, 2018 12:29 PM. Always use your most recent med list.        ALPRAZolam 0.25 MG tablet Commonly known as:  XANAX Take 1 tablet (0.25 mg total) by mouth 2 (two) times daily as needed for anxiety.   citalopram 10  MG tablet Commonly known as:  CELEXA Take 1 tablet (10 mg total) by mouth daily.   metoCLOPramide 10 MG tablet Commonly known as:  REGLAN Take 1 tablet (10 mg total) by mouth 3 (three) times daily before meals.   multivitamin-prenatal 27-0.8 MG Tabs tablet Take 1 tablet by mouth daily at 12 noon.   omeprazole 40 MG capsule Commonly known as:  PRILOSEC Take 40 mg by mouth daily.   omeprazole 20 MG capsule Commonly known as:  PRILOSEC Take 1 capsule (20 mg total) by mouth daily.   promethazine 12.5 MG tablet Commonly known as:  PHENERGAN Take 1 tablet (12.5 mg total) by mouth every 6 (six) hours as needed for refractory nausea / vomiting.   selenium 50 MCG Tabs tablet Take 200 mcg by mouth every other day.   vitamin B-12 50 MCG tablet Commonly known as:  CYANOCOBALAMIN Take 50 mcg  by mouth daily.   vitamin B-12 500 MCG tablet Commonly known as:  CYANOCOBALAMIN Take 500 mcg by mouth daily.         OBJECTIVE:   PHYSICAL EXAM: VS: BP 118/72 (BP Location: Left Arm, Patient Position: Sitting, Cuff Size: Normal)   Pulse 96   Ht 5' (1.524 m)   Wt 179 lb (81.2 kg)   LMP 05/31/2018   SpO2 98%   BMI 34.96 kg/m    EXAM: General: Pt appears well and is in NAD  Hydration: Well-hydrated with moist mucous membranes and good skin turgor  Eyes: External eye exam normal without stare, lid lag or exophthalmos.  EOM intact.  PERRL.  Ears, Nose, Throat: Hearing: Grossly intact bilaterally Dental: Good dentition  Throat: Clear without mass, erythema or exudate  Neck: General: Supple without adenopathy. Thyroid: Thyroid size normal.  No goiter or nodules appreciated. No thyroid bruit.  Lungs: Clear with good BS bilat with no rales, rhonchi, or wheezes  Heart: Auscultation: RRR.  Abdomen: Normoactive bowel sounds, soft, nontender, without masses or organomegaly palpable  Extremities:  BL LE: No pretibial edema normal ROM and strength.  Skin: Hair: Texture and amount normal with gender appropriate distribution Skin Inspection: No rashes Skin Palpation: Skin temperature, texture, and thickness normal to palpation  Mental Status: Judgment, insight: Intact Orientation: Oriented to time, place, and person Mood and affect: No depression, anxiety, or agitation     DATA REVIEWED:  Results for Debra, Hinton (MRN [id number]) as of 07/25/2018 10:10  Ref. Range 04/10/2018 09:43  Thyrotropin Receptor Ab Latest Ref Range: <=16.0 % 30.4 (H)  Results for Debra, Hinton (MRN [id number]) as of 07/26/2018 11:42  Ref. Range 03/02/2018 11:10  Sodium Latest Ref Range: 134 - 144 mmol/L 139  Potassium Latest Ref Range: 3.5 - 5.2 mmol/L 4.4  Chloride Latest Ref Range: 96 - 106 mmol/L 102  CO2 Latest Ref Range: 20 - 29 mmol/L 22  Glucose Latest Ref Range: 65 - 99 mg/dL 92  BUN  Latest Ref Range: 6 - 20 mg/dL 10  Creatinine Latest Ref Range: 0.57 - 1.00 mg/dL 6.44  Calcium Latest Ref Range: 8.7 - 10.2 mg/dL 9.4  BUN/Creatinine Ratio Latest Ref Range: 9 - 23  15  Alkaline Phosphatase Latest Ref Range: 39 - 117 IU/L 70  Albumin Latest Ref Range: 3.5 - 5.5 g/dL 4.3  Albumin/Globulin Ratio Latest Ref Range: 1.2 - 2.2  1.8  AST Latest Ref Range: 0 - 40 IU/L 16  ALT Latest Ref Range: 0 - 32 IU/L 17  Total Protein Latest Ref Range: 6.0 -  8.5 g/dL 6.7  Total Bilirubin Latest Ref Range: 0.0 - 1.2 mg/dL 0.6  GFR, Est Non African American Latest Ref Range: >59 mL/min/1.73 125   Results for TIAWANNA, LUCHSINGER (MRN [id number]) as of 07/26/2018 11:42  Ref. Range 07/25/2018 10:43 07/26/2018 07:50  TSH Latest Ref Range: 0.35 - 4.50 uIU/mL <0.01 (L)   Triiodothyronine (T3) Latest Ref Range: 76 - 181 ng/dL 578 (H)   I6,NGEX(BMWUXL) Latest Ref Range: 0.60 - 1.60 ng/dL  2.44 (H)  Thyroxine (T4) Latest Ref Range: 5.1 - 11.9 mcg/dL 01.0 (H)     ASSESSMENT / PLAN / RECOMMENDATIONS:   1. Hyperthyroidism Secondary to Graves' Disease, during first trimester:  - Clinically she is euthyroid  - Repeat TFT's today continue to show hyperthyroidism, she understands that during pregnancy , mother's are kept  On the hyperthyroid side, we also discussed that PTU is the preferred treatment during the first 3 months of pregnancy for the sake of the fetus, despite the risk of hepatic toxicity to the mother - Pt encouraged to call us with any GI symptoms or other side effects.  Medications PTU 50 mg BID Labs in 3 weeks     2. Graves' Disease:  - No evidence of extrathyroidal manifestations of graves' disease      F/u in 6 week   Addendum: Labs discussed with the patient on 07/26/2018  pm.     Signed electronically by: Lyndle Herrlich, MD  [facility]  [facility] [address] [place] [place], [place] [address] Phone: [phone] FAX:  [phone]      CC: Patient, No Pcp Per No address on file Phone: None  Fax: None   Return to Endocrinology clinic as below: Future Appointments  Date Time Provider Department Center  07/27/2018 10:00 AM [facility] [place] 2 [facility] None  07/27/2018 10:40 AM Nadara Mustard, MD [facility] None  09/05/2018 10:30 AM , Konrad Dolores, MD [id number] None

## 2018-07-26 ENCOUNTER — Other Ambulatory Visit: Payer: Self-pay | Admitting: Internal Medicine

## 2018-07-26 DIAGNOSIS — Z3A08 8 weeks gestation of pregnancy: Secondary | ICD-10-CM | POA: Insufficient documentation

## 2018-07-26 DIAGNOSIS — E0500 Thyrotoxicosis with diffuse goiter without thyrotoxic crisis or storm: Secondary | ICD-10-CM | POA: Insufficient documentation

## 2018-07-26 LAB — T4: T4, Total: 18.2 ug/dL — ABNORMAL HIGH (ref 5.1–11.9)

## 2018-07-26 LAB — T4, FREE: Free T4: 1.92 ng/dL — ABNORMAL HIGH (ref 0.60–1.60)

## 2018-07-26 LAB — T3: T3, Total: 263 ng/dL — ABNORMAL HIGH (ref 76–181)

## 2018-07-26 MED ORDER — PROPYLTHIOURACIL 50 MG PO TABS: 50.0000 mg | ORAL_TABLET | Freq: Two times a day (BID) | ORAL | 1 refills | Status: DC

## 2018-07-27 ENCOUNTER — Ambulatory Visit (INDEPENDENT_AMBULATORY_CARE_PROVIDER_SITE_OTHER): Payer: Commercial Managed Care - PPO | Admitting: Obstetrics & Gynecology

## 2018-07-27 ENCOUNTER — Ambulatory Visit (INDEPENDENT_AMBULATORY_CARE_PROVIDER_SITE_OTHER): Payer: Commercial Managed Care - PPO

## 2018-07-27 ENCOUNTER — Other Ambulatory Visit: Payer: Self-pay | Admitting: Obstetrics and Gynecology

## 2018-07-27 VITALS — BP 100/60 | Wt 180.0 lb

## 2018-07-27 DIAGNOSIS — O3481 Maternal care for other abnormalities of pelvic organs, first trimester: Secondary | ICD-10-CM

## 2018-07-27 DIAGNOSIS — Z3480 Encounter for supervision of other normal pregnancy, unspecified trimester: Secondary | ICD-10-CM

## 2018-07-27 DIAGNOSIS — O99281 Endocrine, nutritional and metabolic diseases complicating pregnancy, first trimester: Secondary | ICD-10-CM

## 2018-07-27 DIAGNOSIS — Z3A01 Less than 8 weeks gestation of pregnancy: Secondary | ICD-10-CM

## 2018-07-27 DIAGNOSIS — Z3689 Encounter for other specified antenatal screening: Secondary | ICD-10-CM

## 2018-07-27 DIAGNOSIS — E0590 Thyrotoxicosis, unspecified without thyrotoxic crisis or storm: Secondary | ICD-10-CM

## 2018-07-27 DIAGNOSIS — N8312 Corpus luteum cyst of left ovary: Secondary | ICD-10-CM | POA: Diagnosis not present

## 2018-07-27 DIAGNOSIS — Z3687 Encounter for antenatal screening for uncertain dates: Secondary | ICD-10-CM

## 2018-07-27 DIAGNOSIS — O99211 Obesity complicating pregnancy, first trimester: Secondary | ICD-10-CM

## 2018-07-27 DIAGNOSIS — O99210 Obesity complicating pregnancy, unspecified trimester: Secondary | ICD-10-CM

## 2018-07-27 NOTE — Progress Notes (Signed)
  Subjective  Mild nausea No pain or bleeding  Objective  BP 100/60   Wt 180 lb (81.6 kg)   LMP 05/31/2018   BMI 35.15 kg/m  General: NAD Pumonary: no increased work of breathing Abdomen: gravid, non-tender Extremities: no edema Psychiatric: mood appropriate, affect full  Assessment  22 y.o. G2P1001 at [redacted]w[redacted]d by  03/14/2019, by Ultrasound presenting for routine prenatal visit  Plan   Problem List Items Addressed This Visit    Hyperthyroidism    Sees endocrinology    PTU recently started, 50 mg BID   Supervision of other normal pregnancy, antepartum    Plan genetic testing nv   Maternal obesity, antepartum   [redacted] weeks gestation of pregnancy        PNV    Review of ULTRASOUND.    I have personally reviewed images and report of recent ultrasound done at [facility].    Plan of management to be discussed with patient.    EDC changed  Annamarie Major, MD, [facility], [facility] 07/27/2018  11:01 AM

## 2018-07-27 NOTE — Patient Instructions (Signed)

## 2018-08-15 ENCOUNTER — Telehealth: Payer: Self-pay | Admitting: Obstetrics & Gynecology

## 2018-08-15 NOTE — Telephone Encounter (Signed)
10 wk OB patient having severe diarrhea along with nausea/vomiting, would like cb for advice.

## 2018-08-15 NOTE — Telephone Encounter (Signed)
I approve, will sign as needed.

## 2018-08-15 NOTE — Telephone Encounter (Signed)
Pt would like a note saying that she is sick and cannot go back to work until Thursday to be safe. Please advise. Thank you!

## 2018-08-15 NOTE — Telephone Encounter (Signed)
Letter sent via My Chart.

## 2018-08-17 ENCOUNTER — Other Ambulatory Visit: Payer: Commercial Managed Care - PPO

## 2018-08-18 ENCOUNTER — Encounter: Payer: Commercial Managed Care - PPO | Admitting: Certified Nurse Midwife

## 2018-08-18 ENCOUNTER — Telehealth: Payer: Self-pay

## 2018-08-18 NOTE — Telephone Encounter (Signed)
Pt needs to for work stating she is not contagious.  [phone]

## 2018-08-18 NOTE — Telephone Encounter (Signed)
Done and sent through my chart

## 2018-09-01 ENCOUNTER — Emergency Department
Admission: EM | Admit: 2018-09-01 | Discharge: 2018-09-01 | Disposition: A | Discharge status: Home / Self Care | Payer: Commercial Managed Care - PPO | Attending: Student in an Organized Health Care Education/Training Program | Admitting: Student in an Organized Health Care Education/Training Program

## 2018-09-01 ENCOUNTER — Encounter: Payer: Self-pay | Admitting: Emergency Medicine

## 2018-09-01 ENCOUNTER — Other Ambulatory Visit: Payer: Self-pay

## 2018-09-01 DIAGNOSIS — O219 Vomiting of pregnancy, unspecified: Secondary | ICD-10-CM | POA: Diagnosis not present

## 2018-09-01 DIAGNOSIS — R112 Nausea with vomiting, unspecified: Secondary | ICD-10-CM

## 2018-09-01 DIAGNOSIS — Z3A12 12 weeks gestation of pregnancy: Secondary | ICD-10-CM | POA: Diagnosis not present

## 2018-09-01 DIAGNOSIS — Z79899 Other long term (current) drug therapy: Secondary | ICD-10-CM | POA: Insufficient documentation

## 2018-09-01 DIAGNOSIS — Z87891 Personal history of nicotine dependence: Secondary | ICD-10-CM | POA: Diagnosis not present

## 2018-09-01 LAB — URINALYSIS, COMPLETE (UACMP) WITH MICROSCOPIC
Bacteria, UA: NONE SEEN
Bilirubin Urine: NEGATIVE
Glucose, UA: 50 mg/dL — AB
Hgb urine dipstick: NEGATIVE
Ketones, ur: 80 mg/dL — AB
Leukocytes,Ua: NEGATIVE
Nitrite: NEGATIVE
Protein, ur: 100 mg/dL — AB
Specific Gravity, Urine: 1.031 — ABNORMAL HIGH (ref 1.005–1.030)
pH: 5 (ref 5.0–8.0)

## 2018-09-01 LAB — COMPREHENSIVE METABOLIC PANEL
ALT: 19 U/L (ref 0–44)
AST: 18 U/L (ref 15–41)
Albumin: 4.1 g/dL (ref 3.5–5.0)
Alkaline Phosphatase: 59 U/L (ref 38–126)
Anion gap: 14 (ref 5–15)
BUN: 11 mg/dL (ref 6–20)
CO2: 22 mmol/L (ref 22–32)
Calcium: 9.1 mg/dL (ref 8.9–10.3)
Chloride: 99 mmol/L (ref 98–111)
Creatinine, Ser: 0.5 mg/dL (ref 0.44–1.00)
GFR calc Af Amer: >60 mL/min (ref >60)
GFR calc non Af Amer: >60 mL/min (ref >60)
Glucose, Bld: 120 mg/dL — ABNORMAL HIGH (ref 70–99)
Potassium: 3.2 mmol/L — ABNORMAL LOW (ref 3.5–5.1)
Sodium: 135 mmol/L (ref 135–145)
Total Bilirubin: 1.6 mg/dL — ABNORMAL HIGH (ref 0.3–1.2)
Total Protein: 7.6 g/dL (ref 6.5–8.1)

## 2018-09-01 LAB — CBC WITH DIFFERENTIAL/PLATELET
Abs Immature Granulocytes: 0.02 10*3/uL (ref 0.00–0.07)
Basophils Absolute: 0 10*3/uL (ref 0.0–0.1)
Basophils Relative: 0 %
Eosinophils Absolute: 0 10*3/uL (ref 0.0–0.5)
Eosinophils Relative: 0 %
HCT: 45.3 % (ref 36.0–46.0)
Hemoglobin: 16.2 g/dL — ABNORMAL HIGH (ref 12.0–15.0)
Immature Granulocytes: 0 %
Lymphocytes Relative: 10 %
Lymphs Abs: 0.9 10*3/uL (ref 0.7–4.0)
MCH: 29.6 pg (ref 26.0–34.0)
MCHC: 35.8 g/dL (ref 30.0–36.0)
MCV: 82.7 fL (ref 80.0–100.0)
Monocytes Absolute: 0.3 10*3/uL (ref 0.1–1.0)
Monocytes Relative: 4 %
Neutro Abs: 7.3 10*3/uL (ref 1.7–7.7)
Neutrophils Relative %: 86 %
Platelets: 239 10*3/uL (ref 150–400)
RBC: 5.48 MIL/uL — ABNORMAL HIGH (ref 3.87–5.11)
RDW: 11.9 % (ref 11.5–15.5)
WBC: 8.5 10*3/uL (ref 4.0–10.5)
nRBC: 0 % (ref 0.0–0.2)

## 2018-09-01 LAB — LIPASE, BLOOD: Lipase: 19 U/L (ref 11–51)

## 2018-09-01 MED ORDER — SUCRALFATE 1 G PO TABS
1.0000 g | ORAL_TABLET | Freq: Once | ORAL | Status: AC
  Administered 2018-09-01: 15:00:00 1 g via ORAL
  Filled 2018-09-01: qty 1

## 2018-09-01 MED ORDER — PROMETHAZINE HCL 25 MG/ML IJ SOLN
12.5000 mg | Freq: Four times a day (QID) | INTRAMUSCULAR | Status: DC | PRN
  Administered 2018-09-01: 12.5 mg via INTRAVENOUS
  Filled 2018-09-01: qty 1

## 2018-09-01 MED ORDER — ONDANSETRON 4 MG PO TBDP
4.0000 mg | ORAL_TABLET | Freq: Once | ORAL | Status: AC
  Administered 2018-09-01: 4 mg via ORAL
  Filled 2018-09-01: qty 1

## 2018-09-01 MED ORDER — KCL IN DEXTROSE-NACL 20-5-0.45 MEQ/L-%-% IV SOLN
Freq: Once | INTRAVENOUS | Status: AC
  Administered 2018-09-01: 12:00:00 via INTRAVENOUS
  Filled 2018-09-01: qty 1000

## 2018-09-01 MED ORDER — ONDANSETRON 4 MG PO TBDP: 4.0000 mg | ORAL_TABLET | Freq: Three times a day (TID) | ORAL | 0 refills | Status: DC | PRN

## 2018-09-01 MED ORDER — SUCRALFATE 1 G PO TABS: 1.0000 g | ORAL_TABLET | Freq: Three times a day (TID) | ORAL | 0 refills | Status: DC

## 2018-09-01 MED ORDER — SODIUM CHLORIDE 0.9 % IV BOLUS
1000.0000 mL | Freq: Once | INTRAVENOUS | Status: AC
  Administered 2018-09-01: 1000 mL via INTRAVENOUS

## 2018-09-01 MED ORDER — PROMETHAZINE HCL 25 MG/ML IJ SOLN: 25.0000 mg | Freq: Four times a day (QID) | INTRAMUSCULAR | Status: DC | PRN

## 2018-09-01 NOTE — ED Triage Notes (Signed)
Pt has been vomiting last 3 days. G2P1. [redacted] weeks pregnant. Has tried reglan and phenergan at home without relief. No pain. No fevers.  Tearful in triage.  Urine starting to get a little darker per pt.  Cannot keep liquids down.

## 2018-09-01 NOTE — ED Notes (Signed)
Called pharmacy for fluids

## 2018-09-01 NOTE — ED Notes (Signed)
Pt PO challenged 

## 2018-09-01 NOTE — ED Notes (Signed)
No episodes of vomiting since arrived to room

## 2018-09-01 NOTE — ED Notes (Signed)
Pt unable to void at this time but is aware that sample is needed

## 2018-09-01 NOTE — ED Provider Notes (Signed)
[facility] Emergency Department Provider Note    First MD Initiated Contact with Patient 09/01/18 0935     (approximate)  I have reviewed the triage vital signs and the nursing notes.   HISTORY  Chief Complaint Emesis During Pregnancy    HPI Debra Hinton is a 23 y.o. female [place] past medical history presents the ER for evaluation of nausea and vomiting for the past 4 days.  Patient is [redacted] weeks pregnant.  In her second pregnancy and previous 1 was complicated by severe morning sickness.  States that she not been having any fevers.  Denies any pain just feels dehydrated she is been able to keep anything down.  Has been trying Reglan as well as Phenergan at home without any improvement.  Denies any measured fevers.  No sick contacts.  No vaginal bleeding or discharge.  No dysuria or diarrhea.    Past Medical History:  Diagnosis Date  . Anxiety   . Anxiety and depression   . Depressive disorder   . GERD (gastroesophageal reflux disease)   . Hyperthyroidism 2019   Possible Graves disease  . IBS (irritable bowel syndrome)   . Irritable bowel syndrome (IBS)   . Thyroid disease    Family History  Problem Relation Age of Onset  . Breast cancer Other         mggm  . Hypertension Mother   . Hyperlipidemia Father   . Thyroid disease Maternal Grandmother   . Dementia Maternal Grandmother   . Heart attack Maternal Grandfather   . Diabetes Paternal Grandmother   . Alcoholism Paternal Grandmother   . Cancer Paternal Grandfather        pancreatic  . Healthy Brother   . Hypothyroidism Maternal Aunt   . Hypothyroidism Maternal Aunt   . Breast cancer Maternal Aunt        30s   Past Surgical History:  Procedure Laterality Date  . NO PAST SURGERIES     Patient Active Problem List   Diagnosis Date Noted  . [redacted] weeks gestation of pregnancy 07/26/2018  . Graves disease 07/26/2018  . Supervision of other normal pregnancy, antepartum 07/20/2018  .  Maternal obesity, antepartum 07/20/2018  . GERD (gastroesophageal reflux disease) 05/31/2018  . Hyperthyroidism 04/10/2018  . Irritable bowel syndrome with constipation 02/22/2018  . Generalized anxiety disorder 02/22/2018  . Vitamin D deficiency 02/22/2018      Prior to Admission medications   Medication Sig Start Date End Date Taking? Authorizing Provider  metoCLOPramide (REGLAN) 10 MG tablet Take 1 tablet (10 mg total) by mouth 3 (three) times daily before meals. 07/17/18  Yes Farrel Conners, CNM  multivitamin (VIT Lorel Monaco C) CHEW chewable tablet Chew 2 tablets by mouth daily.   Yes [provider]  promethazine (PHENERGAN) 12.5 MG tablet Take 1 tablet (12.5 mg total) by mouth every 6 (six) hours as needed for refractory nausea / vomiting. 07/17/18  Yes Farrel Conners, CNM  propylthiouracil (PTU) 50 MG tablet Take 1 tablet (50 mg total) by mouth 2 (two) times daily. 07/26/18  Yes Shamleffer, Konrad Dolores, MD  ALPRAZolam Prudy Feeler) 0.25 MG tablet Take 1 tablet (0.25 mg total) by mouth 2 (two) times daily as needed for anxiety. Patient not taking: Reported on 09/01/2018 05/04/18   Johnna Acosta, NP  citalopram (CELEXA) 10 MG tablet Take 1 tablet (10 mg total) by mouth daily. Patient not taking: Reported on 09/01/2018 02/22/18   Carlean Jews, NP  omeprazole (PRILOSEC) 20 MG  capsule Take 1 capsule (20 mg total) by mouth daily. Patient not taking: Reported on 09/01/2018 02/22/18   Carlean Jews, NP  sucralfate (CARAFATE) 1 g tablet Take 1 tablet (1 g total) by mouth 4 (four) times daily -  with meals and at bedtime. 09/01/18 09/01/19  Willy Eddy, MD    Allergies Patient has no known allergies.    Social History Social History   Tobacco Use  . Smoking status: Former Smoker    Last attempt to quit: 07/03/2018    Years since quitting: 0.1  . Smokeless tobacco: Never Used  Substance Use Topics  . Alcohol use: Yes    Comment: 2 drinks/month  . Drug use: Never     Types: Marijuana    Review of Systems Patient denies headaches, rhinorrhea, blurry vision, numbness, shortness of breath, chest pain, edema, cough, abdominal pain, nausea, vomiting, diarrhea, dysuria, fevers, rashes or hallucinations unless otherwise stated above in HPI. ____________________________________________   PHYSICAL EXAM:  VITAL SIGNS: Vitals:   09/01/18 0920  BP: (!) 113/100  Pulse: (!) 113  Resp: (!) 24  Temp: 98.6 F (37 C)  SpO2: 98%    Constitutional: Alert and oriented.  Eyes: Conjunctivae are normal.  Head: Atraumatic. Nose: No congestion/rhinnorhea. Mouth/Throat: Mucous membranes are dry.   Neck: No stridor. Painless ROM.  Cardiovascular: Normal rate, regular rhythm. Grossly normal heart sounds.  Good peripheral circulation. Respiratory: Normal respiratory effort.  No retractions. Lungs CTAB. Gastrointestinal: Soft and nontender. No distention. No abdominal bruits. No CVA tenderness. Genitourinary:  Musculoskeletal: No lower extremity tenderness nor edema.  No joint effusions. Neurologic:  Normal speech and language. No gross focal neurologic deficits are appreciated. No facial droop Skin:  Skin is warm, dry and intact. No rash noted. Psychiatric: Mood and affect are normal. Speech and behavior are normal.  ____________________________________________   LABS (all labs ordered are listed, but only abnormal results are displayed)  Results for orders placed or performed during the hospital encounter of 09/01/18 (from the past 24 hour(s))  CBC with Differential     Status: Abnormal   Collection Time: 09/01/18  9:33 AM  Result Value Ref Range   WBC 8.5 4.0 - 10.5 K/uL   RBC 5.48 (H) 3.87 - 5.11 MIL/uL   Hemoglobin 16.2 (H) 12.0 - 15.0 g/dL   HCT 96.0 45.4 - 09.8 %   MCV 82.7 80.0 - 100.0 fL   MCH 29.6 26.0 - 34.0 pg   MCHC 35.8 30.0 - 36.0 g/dL   RDW 11.9 14.7 - 82.9 %   Platelets 239 150 - 400 K/uL   nRBC 0.0 0.0 - 0.2 %   Neutrophils Relative  % 86 %   Neutro Abs 7.3 1.7 - 7.7 K/uL   Lymphocytes Relative 10 %   Lymphs Abs 0.9 0.7 - 4.0 K/uL   Monocytes Relative 4 %   Monocytes Absolute 0.3 0.1 - 1.0 K/uL   Eosinophils Relative 0 %   Eosinophils Absolute 0.0 0.0 - 0.5 K/uL   Basophils Relative 0 %   Basophils Absolute 0.0 0.0 - 0.1 K/uL   Immature Granulocytes 0 %   Abs Immature Granulocytes 0.02 0.00 - 0.07 K/uL  Comprehensive metabolic panel     Status: Abnormal   Collection Time: 09/01/18  9:33 AM  Result Value Ref Range   Sodium 135 135 - 145 mmol/L   Potassium 3.2 (L) 3.5 - 5.1 mmol/L   Chloride 99 98 - 111 mmol/L   CO2 22 22 - 32  mmol/L   Glucose, Bld 120 (H) 70 - 99 mg/dL   BUN 11 6 - 20 mg/dL   Creatinine, Ser 1.610.50 0.44 - 1.00 mg/dL   Calcium 9.1 8.9 - 09.610.3 mg/dL   Total Protein 7.6 6.5 - 8.1 g/dL   Albumin 4.1 3.5 - 5.0 g/dL   AST 18 15 - 41 U/L   ALT 19 0 - 44 U/L   Alkaline Phosphatase 59 38 - 126 U/L   Total Bilirubin 1.6 (H) 0.3 - 1.2 mg/dL   GFR calc non Af Amer >60 >60 mL/min   GFR calc Af Amer >60 >60 mL/min   Anion gap 14 5 - 15  Lipase, blood     Status: None   Collection Time: 09/01/18  9:33 AM  Result Value Ref Range   Lipase 19 11 - 51 U/L  Urinalysis, Complete w Microscopic     Status: Abnormal   Collection Time: 09/01/18  9:44 AM  Result Value Ref Range   Color, Urine AMBER (A) YELLOW   APPearance HAZY (A) CLEAR   Specific Gravity, Urine 1.031 (H) 1.005 - 1.030   pH 5.0 5.0 - 8.0   Glucose, UA 50 (A) NEGATIVE mg/dL   Hgb urine dipstick NEGATIVE NEGATIVE   Bilirubin Urine NEGATIVE NEGATIVE   Ketones, ur 80 (A) NEGATIVE mg/dL   Protein, ur 045100 (A) NEGATIVE mg/dL   Nitrite NEGATIVE NEGATIVE   Leukocytes,Ua NEGATIVE NEGATIVE   RBC / HPF 0-5 0 - 5 RBC/hpf   WBC, UA 0-5 0 - 5 WBC/hpf   Bacteria, UA NONE SEEN NONE SEEN   Squamous Epithelial / LPF 6-10 0 - 5   Mucus PRESENT    ____________________________________________ ____________________________________________  RADIOLOGY    ____________________________________________   PROCEDURES  Procedure(s) performed:  Procedures    Critical Care performed: no ____________________________________________   INITIAL IMPRESSION / ASSESSMENT AND PLAN / ED COURSE  Pertinent labs & imaging results that were available during my care of the patient were reviewed by me and considered in my medical decision making (see chart for details).   DDX: dehydration, hyperemesis, electrolyte abn, enteritis, gastritis  Debra Hinton is a 23 y.o. who presents to the ED with symptoms as described above.  Patient in no acute distress.  Does appear dehydrated.  Blood work does show evidence of increased spec graph on her urinalysis as well as ketosis.  Was given IV fluids as well as IV resuscitation with D5 NS potassium.  Patient tolerating oral hydration.  Patient stable and appropriate for outpatient follow-up..    The patient was evaluated in Emergency Department today for the symptoms described in the history of present illness. He/she was evaluated in the context of the global COVID-19 pandemic, which necessitated consideration that the patient might be at risk for infection with the SARS-CoV-2 virus that causes COVID-19. Institutional protocols and algorithms that pertain to the evaluation of patients at risk for COVID-19 are in a state of rapid change based on information released by regulatory bodies including the CDC and federal and state organizations. These policies and algorithms were followed during the patient's care in the ED.   As part of my medical decision making, I reviewed the following data within the electronic MEDICAL RECORD NUMBER Nursing notes reviewed and incorporated, Labs reviewed, notes from prior ED visits and [place] Controlled Substance Database   ____________________________________________   FINAL CLINICAL IMPRESSION(S) / ED DIAGNOSES  Final diagnoses:  Non-intractable vomiting with nausea, unspecified  vomiting type  [redacted] weeks  gestation of pregnancy      NEW MEDICATIONS STARTED DURING THIS VISIT:  New Prescriptions   SUCRALFATE (CARAFATE) 1 G TABLET    Take 1 tablet (1 g total) by mouth 4 (four) times daily -  with meals and at bedtime.     Note:  This document was prepared using Dragon voice recognition software and may include unintentional dictation errors.    Willy Eddy, MD 09/01/18 [phone]

## 2018-09-01 NOTE — ED Notes (Signed)
No vomiting at this time 

## 2018-09-01 NOTE — ED Notes (Signed)
Pt reports that she is [redacted] weeks pregnant and has been vomiting and unable to hold anything down since Monday - she reports she is still voiding just in decreased amounts - this is pt 2nd child and she reports hx of same with previous child - pt took reglan and phenergan without results

## 2018-09-01 NOTE — ED Notes (Signed)
First Nurse Note: Patient states she is [redacted] weeks pregnant and having recurrent vomiting, states she can't keep anything down.  Mother: Mora Bellman at [phone] asked to wait in car.

## 2018-09-05 ENCOUNTER — Ambulatory Visit: Payer: Commercial Managed Care - PPO | Admitting: Internal Medicine

## 2018-09-05 NOTE — Progress Notes (Deleted)
Name: Debra Hinton  MRN/ DOB: [id number], 25-Oct-1995    Age/ Sex: 23 y.o., female     PCP: Patient, No Pcp Per   Reason for Endocrinology Evaluation: Hyperthyroidism      Initial Endocrinology Clinic Visit: 04/11/2019    PATIENT IDENTIFIER: [name] is a 23 y.o., female with a past medical history of IBS, GAD and graves' disease. She has followed with [facility] since 04/11/2019 for consultative assistance with management of her Hyperthyroidism.   HISTORICAL SUMMARY:  [name], 2019 with c/o constipation alternating with diarrhea as well as anxiety and weight gain. Her Labs revealed a suppressed  TSH at < 0.006 uIU/mL and elevated FT4 and T3. A thyroid ultrasound was unrevealing.   On her initial visit to our office in November, 2020 her TSH continued to be low but with normalization of her FT4. We decided to withhold therapy due to improvement in her 80TFT's    FH with thyroid disease.  SUBJECTIVE:    Today (09/05/2018):  Debra Hinton is here for a follow up on her graves' disease. She was diagnosed with this in November, 2019. She was advised against pregnancy at that time , she missed her 6 week follow up.   Today she is 8 weeks and 2 days. She is not sure of her LMP as she had intermittent spotting.   She has weight loss recently, she has been sick with nausea.  She denies diarrhea, palpitations, heat intolerance  She denies anxiety or jittery sensations.   She denies tremors.  No local neck symptoms.     ROS:  As per HPI.   HISTORY:  Past Medical History:  Past Medical History:  Diagnosis Date  . Anxiety   . Anxiety and depression   . Depressive disorder   . GERD (gastroesophageal reflux disease)   . Hyperthyroidism 2019   Possible Graves disease  . IBS (irritable bowel syndrome)   . Irritable bowel syndrome (IBS)   . Thyroid disease    Past Surgical History:   Past Surgical History:  Procedure Laterality Date  . NO PAST SURGERIES      Social History:  reports that she quit smoking about 2 months ago. She has never used smokeless tobacco. She reports current alcohol use. She reports that she does not use drugs. Family History:  Family History  Problem Relation Age of Onset  . Breast cancer Other         mggm  . Hypertension Mother   . Hyperlipidemia Father   . Thyroid disease Maternal Grandmother   . Dementia Maternal Grandmother   . Heart attack Maternal Grandfather   . Diabetes Paternal Grandmother   . Alcoholism Paternal Grandmother   . Cancer Paternal Grandfather        pancreatic  . Healthy Brother   . Hypothyroidism Maternal Aunt   . Hypothyroidism Maternal Aunt   . Breast cancer Maternal Aunt        30s     HOME MEDICATIONS: Allergies as of 09/05/2018   No Known Allergies     Medication List       Accurate as of September 05, 2018  8:49 AM. Always use your most recent med list.        ALPRAZolam 0.25 MG tablet Commonly known as:  XANAX Take 1 tablet (0.25 mg total) by mouth 2 (two) times daily as needed for anxiety.   citalopram  10 MG tablet Commonly known as:  CELEXA Take 1 tablet (10 mg total) by mouth daily.   metoCLOPramide 10 MG tablet Commonly known as:  REGLAN Take 1 tablet (10 mg total) by mouth 3 (three) times daily before meals.   multivitamin Chew chewable tablet Chew 2 tablets by mouth daily.   omeprazole 20 MG capsule Commonly known as:  PRILOSEC Take 1 capsule (20 mg total) by mouth daily.   ondansetron 4 MG disintegrating tablet Commonly known as:  Zofran ODT Take 1 tablet (4 mg total) by mouth every 8 (eight) hours as needed for nausea or vomiting.   promethazine 12.5 MG tablet Commonly known as:  PHENERGAN Take 1 tablet (12.5 mg total) by mouth every 6 (six) hours as needed for refractory nausea / vomiting.   propylthiouracil 50 MG tablet Commonly known as:  PTU Take 1 tablet (50 mg  total) by mouth 2 (two) times daily.   sucralfate 1 g tablet Commonly known as:  Carafate Take 1 tablet (1 g total) by mouth 4 (four) times daily -  with meals and at bedtime.         OBJECTIVE:   PHYSICAL EXAM: VS: LMP 05/31/2018    EXAM: General: Pt appears well and is in NAD  Hydration: Well-hydrated with moist mucous membranes and good skin turgor  Eyes: External eye exam normal without stare, lid lag or exophthalmos.  EOM intact.  PERRL.  Ears, Nose, Throat: Hearing: Grossly intact bilaterally Dental: Good dentition  Throat: Clear without mass, erythema or exudate  Neck: General: Supple without adenopathy. Thyroid: Thyroid size normal.  No goiter or nodules appreciated. No thyroid bruit.  Lungs: Clear with good BS bilat with no rales, rhonchi, or wheezes  Heart: Auscultation: RRR.  Abdomen: Normoactive bowel sounds, soft, nontender, without masses or organomegaly palpable  Extremities:  BL LE: No pretibial edema normal ROM and strength.  Skin: Hair: Texture and amount normal with gender appropriate distribution Skin Inspection: No rashes Skin Palpation: Skin temperature, texture, and thickness normal to palpation  Mental Status: Judgment, insight: Intact Orientation: Oriented to time, place, and person Mood and affect: No depression, anxiety, or agitation     DATA REVIEWED:  Results for Debra, Hinton (MRN [id number]) as of 07/25/2018 10:10  Ref. Range 04/10/2018 09:43  Thyrotropin Receptor Ab Latest Ref Range: <=16.0 % 30.4 (H)  Results for Debra, Hinton (MRN [id number]) as of 07/26/2018 11:42  Ref. Range 03/02/2018 11:10  Sodium Latest Ref Range: 134 - 144 mmol/L 139  Potassium Latest Ref Range: 3.5 - 5.2 mmol/L 4.4  Chloride Latest Ref Range: 96 - 106 mmol/L 102  CO2 Latest Ref Range: 20 - 29 mmol/L 22  Glucose Latest Ref Range: 65 - 99 mg/dL 92  BUN Latest Ref Range: 6 - 20 mg/dL 10  Creatinine Latest Ref Range: 0.57 - 1.00 mg/dL 0.86  Calcium  Latest Ref Range: 8.7 - 10.2 mg/dL 9.4  BUN/Creatinine Ratio Latest Ref Range: 9 - 23  15  Alkaline Phosphatase Latest Ref Range: 39 - 117 IU/L 70  Albumin Latest Ref Range: 3.5 - 5.5 g/dL 4.3  Albumin/Globulin Ratio Latest Ref Range: 1.2 - 2.2  1.8  AST Latest Ref Range: 0 - 40 IU/L 16  ALT Latest Ref Range: 0 - 32 IU/L 17  Total Protein Latest Ref Range: 6.0 - 8.5 g/dL 6.7  Total Bilirubin Latest Ref Range: 0.0 - 1.2 mg/dL 0.6  GFR, Est Non African American Latest Ref Range: >59 mL/min/1.73 125  Results for COURTENY, MACHO (MRN [id number]) as of 07/26/2018 11:42  Ref. Range 07/25/2018 10:43 07/26/2018 07:50  TSH Latest Ref Range: 0.35 - 4.50 uIU/mL <0.01 (L)   Triiodothyronine (T3) Latest Ref Range: 76 - 181 ng/dL 212 (H)   Y4,MGNO(IBBCWU) Latest Ref Range: 0.60 - 1.60 ng/dL  8.89 (H)  Thyroxine (T4) Latest Ref Range: 5.1 - 11.9 mcg/dL 16.9 (H)     ASSESSMENT / PLAN / RECOMMENDATIONS:   1. Hyperthyroidism Secondary to Graves' Disease, during first trimester:  - Clinically she is euthyroid  - Repeat TFT's today continue to show hyperthyroidism, she understands that during pregnancy , mother's are kept  On the hyperthyroid side, we also discussed that PTU is the preferred treatment during the first 3 months of pregnancy for the sake of the fetus, despite the risk of hepatic toxicity to the mother - Pt encouraged to call us with any GI symptoms or other side effects.  Medications PTU 50 mg BID Labs in 3 weeks     2. Graves' Disease:  - No evidence of extrathyroidal manifestations of graves' disease      F/u in 6 week   Addendum: Labs discussed with the patient on 07/26/2018 @1245  pm.     Signed electronically by: Lyndle Herrlich, MD  [facility] End[facility]  [facility] [address] [place] [place], [place] [address] Phone: [phone] FAX: [phone]      CC: Patient, No Pcp Per No address on file Phone: None  Fax: None   Return  to Endocrinology clinic as below: Future Appointments  Date Time Provider Department Center  09/05/2018 10:30 AM Evertt Chouinard, Konrad Dolores, MD [facility] None  09/07/2018  8:50 AM Farrel Conners, CNM [facility] None

## 2018-09-07 ENCOUNTER — Ambulatory Visit (INDEPENDENT_AMBULATORY_CARE_PROVIDER_SITE_OTHER): Payer: Commercial Managed Care - PPO | Admitting: Certified Nurse Midwife

## 2018-09-07 ENCOUNTER — Other Ambulatory Visit: Payer: Self-pay

## 2018-09-07 VITALS — BP 104/66 | Wt 182.0 lb

## 2018-09-07 DIAGNOSIS — O0990 Supervision of high risk pregnancy, unspecified, unspecified trimester: Secondary | ICD-10-CM

## 2018-09-07 DIAGNOSIS — K581 Irritable bowel syndrome with constipation: Secondary | ICD-10-CM

## 2018-09-07 DIAGNOSIS — E0590 Thyrotoxicosis, unspecified without thyrotoxic crisis or storm: Secondary | ICD-10-CM

## 2018-09-07 DIAGNOSIS — O99211 Obesity complicating pregnancy, first trimester: Secondary | ICD-10-CM

## 2018-09-07 DIAGNOSIS — Z1371 Encounter for nonprocreative screening for genetic disease carrier status: Secondary | ICD-10-CM

## 2018-09-07 DIAGNOSIS — O99210 Obesity complicating pregnancy, unspecified trimester: Secondary | ICD-10-CM

## 2018-09-07 DIAGNOSIS — Z3A13 13 weeks gestation of pregnancy: Secondary | ICD-10-CM

## 2018-09-07 DIAGNOSIS — O99281 Endocrine, nutritional and metabolic diseases complicating pregnancy, first trimester: Secondary | ICD-10-CM

## 2018-09-07 DIAGNOSIS — Z1379 Encounter for other screening for genetic and chromosomal anomalies: Secondary | ICD-10-CM

## 2018-09-07 LAB — POCT URINALYSIS DIPSTICK OB
Glucose, UA: NEGATIVE
POC,PROTEIN,UA: NEGATIVE

## 2018-09-07 MED ORDER — ONDANSETRON 4 MG PO TBDP: 4.0000 mg | ORAL_TABLET | Freq: Three times a day (TID) | ORAL | 2 refills | Status: DC | PRN

## 2018-09-07 MED ORDER — SUCRALFATE 1 G PO TABS: 1.0000 g | ORAL_TABLET | Freq: Three times a day (TID) | ORAL | 2 refills | Status: DC

## 2018-09-07 NOTE — Progress Notes (Signed)
ROB

## 2018-09-07 NOTE — Progress Notes (Signed)
HROB at 13 wk1d: was seen in ER 4/10 for nausea and vomiting. Begun on Zofran 4 mgm and Carafate 1 GM qid (is taking BID) and given IV hydration. Feels much better and has not vomited since then. Wt up 2# since 3/5 Has hyperthyroidism/ Graves disease. Stopped PTU because it made a metallic taste in her mouth. Has not followed up with her endocrinologist (appointments keep getting cancelled)-was suppose to have thyroid labs redone Stopped taking Celexa and alprazolam for anxiety also. Taking 2 Flintstone vitamins daily Desires MAterniT 21 and Inheritest. Will draw today along with thyroid labs and hemoglobin A1C ( BMI 35.54) ROB in 3 weeks-desires [facility] at that time.  Farrel Conners, CNM

## 2018-09-08 ENCOUNTER — Encounter: Payer: Commercial Managed Care - PPO | Admitting: Certified Nurse Midwife

## 2018-09-08 LAB — TSH+FREE T4
Free T4: 1.62 ng/dL (ref 0.82–1.77)
TSH: <0.006 u[IU]/mL — ABNORMAL LOW (ref 0.450–4.500)

## 2018-09-08 LAB — HEMOGLOBIN A1C
Est. average glucose Bld gHb Est-mCnc: 88 mg/dL
Hgb A1c MFr Bld: 4.7 % — ABNORMAL LOW (ref 4.8–5.6)

## 2018-09-08 LAB — T3, FREE: T3, Free: 4.2 pg/mL (ref 2.0–4.4)

## 2018-09-12 LAB — MATERNIT21  PLUS CORE+ESS+SCA, BLOOD
11q23 deletion (Jacobsen): NOT DETECTED
15q11 deletion (PW Angelman): NOT DETECTED
1p36 deletion syndrome: NOT DETECTED
22q11 deletion (DiGeorge): NOT DETECTED
4p16 deletion(Wolf-Hirschhorn): NOT DETECTED
5p15 deletion (Cri-du-chat): NOT DETECTED
8q24 deletion (Langer-Giedion): NOT DETECTED
Fetal Fraction: 12
Monosomy X (Turner Syndrome): NOT DETECTED
Result (T21): NEGATIVE
Trisomy 13 (Patau syndrome): NEGATIVE
Trisomy 16: NOT DETECTED
Trisomy 18 (Edwards syndrome): NEGATIVE
Trisomy 21 (Down syndrome): NEGATIVE
Trisomy 22: NOT DETECTED
XXX (Triple X Syndrome): NOT DETECTED
XXY (Klinefelter Syndrome): NOT DETECTED
XYY (Jacobs Syndrome): NOT DETECTED

## 2018-09-21 LAB — INHERITEST CORE(CF97,SMA,FRAX)

## 2018-09-27 ENCOUNTER — Telehealth: Payer: Self-pay

## 2018-09-27 NOTE — Telephone Encounter (Signed)
Called patient to get her rescheduled from canceled appointment and she stated she did not need to come in because her OBGYN did labs and told patient her labs were normal

## 2018-09-28 ENCOUNTER — Ambulatory Visit (INDEPENDENT_AMBULATORY_CARE_PROVIDER_SITE_OTHER): Payer: Commercial Managed Care - PPO | Admitting: Advanced Practice Midwife

## 2018-09-28 ENCOUNTER — Encounter: Payer: Self-pay | Admitting: Advanced Practice Midwife

## 2018-09-28 ENCOUNTER — Other Ambulatory Visit: Payer: Self-pay

## 2018-09-28 VITALS — BP 104/64 | Wt 173.0 lb

## 2018-09-28 DIAGNOSIS — O0992 Supervision of high risk pregnancy, unspecified, second trimester: Secondary | ICD-10-CM

## 2018-09-28 DIAGNOSIS — Z3A16 16 weeks gestation of pregnancy: Secondary | ICD-10-CM

## 2018-09-28 DIAGNOSIS — O0990 Supervision of high risk pregnancy, unspecified, unspecified trimester: Secondary | ICD-10-CM

## 2018-09-28 LAB — POCT URINALYSIS DIPSTICK OB
Glucose, UA: NEGATIVE
POC,PROTEIN,UA: NEGATIVE

## 2018-09-28 NOTE — Patient Instructions (Addendum)
Perinatal Anxiety When a woman feels excessive tension or worry (anxiety) during pregnancy or during the first 12 months after she gives birth, she has a condition called perinatal anxiety. Anxiety can interfere with work, school, relationships, and other everyday activities. If it is not managed properly, it can also cause problems in the mother and her baby.  If you are pregnant and you have symptoms of an anxiety disorder, it is important to talk with your health care provider. What are the causes? The exact cause of this condition is not known. Hormonal changes during and after pregnancy may play a role in causing perinatal anxiety. What increases the risk? You are more likely to develop this condition if:  You have a personal or family history of depression, anxiety, or mood disorders.  You experience a stressful life event during pregnancy, such as the death of a loved one.  You have a lot of regular life stress, such as being a single parent.  You have thyroid problems. What are the signs or symptoms? Perinatal anxiety can be different for everyone. It may include:  Panic attacks (panic disorder). These are intense episodes of fear or discomfort that may also cause sweating, nausea, shortness of breath, or fear of dying. They usually last 5-15 minutes.  Reliving an upsetting (traumatic) event through distressing thoughts, dreams, or flashbacks (post-traumatic stress disorder, or PTSD).  Excessive worry about multiple problems (generalized anxiety disorder).  Fear and stress about leaving certain people or loved ones (separation anxiety).  Performing repetitive tasks (compulsions) to relieve stress or worry (obsessive compulsive disorder, or OCD).  Fear of certain objects or situations (phobias).  Excessive worrying, such as a constant feeling that something bad is going to happen.  Inability to relax.  Difficulty concentrating.  Sleep problems.  Frequent nightmares or  disturbing thoughts. How is this diagnosed? This condition is diagnosed based on a physical exam and mental evaluation. In some cases, your health care provider may use an anxiety screening tool. These tools include a list of questions that can help a health care provider diagnose anxiety. Your health care provider may refer you to a mental health expert who specializes in anxiety. How is this treated? This condition may be treated with:  Medicines. Your health care provider will only give you medicines that have been proven safe for pregnancy and breastfeeding.  Talk therapy with a mental health professional to help change your patterns of thinking (cognitive behavioral therapy).  Mindfulness-based stress reduction.  Other relaxation therapies, such as deep breathing or guided muscle relaxation.  Support groups. Follow these instructions at home: Lifestyle  Do not use any products that contain nicotine or tobacco, such as cigarettes and e-cigarettes. If you need help quitting, ask your health care provider.  Do not use alcohol when you are pregnant. After your baby is born, limit alcohol intake to no more than 1 drink a day. One drink equals 12 oz of beer, 5 oz of wine, or 1 oz of hard liquor.  Consider joining a support group for new mothers. Ask your health care provider for recommendations.  Take good care of yourself. Make sure you: ? Get plenty of sleep. If you are having trouble sleeping, talk with your health care provider. ? Eat a healthy diet. This includes plenty of fruits and vegetables, whole grains, and lean proteins. ? Exercise regularly, as told by your health care provider. Ask your health care provider what exercises are safe for you. General instructions  Take over-the-counter  and prescription medicines only as told by your health care provider.  Talk with your partner or family members about your feelings during pregnancy. Share any concerns or fears that you may  have.  Ask for help with tasks or chores when you need it. Ask friends and family members to provide meals, watch your children, or help with cleaning.  Keep all follow-up visits as told by your health care provider. This is important. Contact a health care provider if:  You (or people close to you) notice that you have any symptoms of anxiety or depression.  You have anxiety and your symptoms get worse.  You experience side effects from medicines, such as nausea or sleep problems. Get help right away if:  You feel like hurting yourself, your baby, or someone else. If you ever feel like you may hurt yourself or others, or have thoughts about taking your own life, get help right away. You can go to your nearest emergency department or call:  Your local emergency services (911 in the [place]  A suicide crisis helpline, such as the [place] at 717-138-6259. This is open 24 hours a day. Summary  Perinatal anxiety is when a woman feels excessive tension or worry during pregnancy or during the first 12 months after she gives birth.  Perinatal anxiety may include panic attacks, post-traumatic stress disorder, separation anxiety, phobias, or generalized anxiety.  Perinatal anxiety can cause physical health problems in the mother and baby if not properly managed.  This condition is treated with medicines, talk therapy, stress reduction therapies, or a combination of two or more treatments.  Talk with your partner or family members about your concerns or fears. Do not be afraid to ask for help. This information is not intended to replace advice given to you by your health care provider. Make sure you discuss any questions you have with your health care provider. Document Released: 07/07/2016 Document Revised: 07/07/2016 Document Reviewed: 07/07/2016 Elsevier Interactive Patient Education  2019 [place] of Pregnancy The second trimester is  from week 14 through week 27 (months 4 through 6). The second trimester is often a time when you feel your best. Your body has adjusted to being pregnant, and you begin to feel better physically. Usually, morning sickness has lessened or quit completely, you may have more energy, and you may have an increase in appetite. The second trimester is also a time when the fetus is growing rapidly. At the end of the sixth month, the fetus is about 9 inches long and weighs about 1 pounds. You will likely begin to feel the baby move (quickening) between 16 and 20 weeks of pregnancy. Body changes during your second trimester Your body continues to go through many changes during your second trimester. The changes vary from woman to woman.  Your weight will continue to increase. You will notice your lower abdomen bulging out.  You may begin to get stretch marks on your hips, abdomen, and breasts.  You may develop headaches that can be relieved by medicines. The medicines should be approved by your health care provider.  You may urinate more often because the fetus is pressing on your bladder.  You may develop or continue to have heartburn as a result of your pregnancy.  You may develop constipation because certain hormones are causing the muscles that push waste through your intestines to slow down.  You may develop hemorrhoids or swollen, bulging veins (varicose veins).  You may have  back pain. This is caused by: ? Weight gain. ? Pregnancy hormones that are relaxing the joints in your pelvis. ? A shift in weight and the muscles that support your balance.  Your breasts will continue to grow and they will continue to become tender.  Your gums may bleed and may be sensitive to brushing and flossing.  Dark spots or blotches (chloasma, mask of pregnancy) may develop on your face. This will likely fade after the baby is born.  A dark line from your belly button to the pubic area (linea nigra) may appear.  This will likely fade after the baby is born.  You may have changes in your hair. These can include thickening of your hair, rapid growth, and changes in texture. Some women also have hair loss during or after pregnancy, or hair that feels dry or thin. Your hair will most likely return to normal after your baby is born. What to expect at prenatal visits During a routine prenatal visit:  You will be weighed to make sure you and the fetus are growing normally.  Your blood pressure will be taken.  Your abdomen will be measured to track your baby's growth.  The fetal heartbeat will be listened to.  Any test results from the previous visit will be discussed. Your health care provider may ask you:  How you are feeling.  If you are feeling the baby move.  If you have had any abnormal symptoms, such as leaking fluid, bleeding, severe headaches, or abdominal cramping.  If you are using any tobacco products, including cigarettes, chewing tobacco, and electronic cigarettes.  If you have any questions. Other tests that may be performed during your second trimester include:  Blood tests that check for: ? Low iron levels (anemia). ? High blood sugar that affects pregnant women (gestational diabetes) between 86 and 28 weeks. ? Rh antibodies. This is to check for a protein on red blood cells (Rh factor).  Urine tests to check for infections, diabetes, or protein in the urine.  An ultrasound to confirm the proper growth and development of the baby.  An amniocentesis to check for possible genetic problems.  Fetal screens for spina bifida and Down syndrome.  HIV (human immunodeficiency virus) testing. Routine prenatal testing includes screening for HIV, unless you choose not to have this test. Follow these instructions at home: Medicines  Follow your health care provider's instructions regarding medicine use. Specific medicines may be either safe or unsafe to take during pregnancy.  Take  a prenatal vitamin that contains at least 600 micrograms (mcg) of folic acid.  If you develop constipation, try taking a stool softener if your health care provider approves. Eating and drinking   Eat a balanced diet that includes fresh fruits and vegetables, whole grains, good sources of protein such as meat, eggs, or tofu, and low-fat dairy. Your health care provider will help you determine the amount of weight gain that is right for you.  Avoid raw meat and uncooked cheese. These carry germs that can cause birth defects in the baby.  If you have low calcium intake from food, talk to your health care provider about whether you should take a daily calcium supplement.  Limit foods that are high in fat and processed sugars, such as fried and sweet foods.  To prevent constipation: ? Drink enough fluid to keep your urine clear or pale yellow. ? Eat foods that are high in fiber, such as fresh fruits and vegetables, whole grains, and beans.  Activity  Exercise only as directed by your health care provider. Most women can continue their usual exercise routine during pregnancy. Try to exercise for 30 minutes at least 5 days a week. Stop exercising if you experience uterine contractions.  Avoid heavy lifting, wear low heel shoes, and practice good posture.  A sexual relationship may be continued unless your health care provider directs you otherwise. Relieving pain and discomfort  Wear a good support bra to prevent discomfort from breast tenderness.  Take warm sitz baths to soothe any pain or discomfort caused by hemorrhoids. Use hemorrhoid cream if your health care provider approves.  Rest with your legs elevated if you have leg cramps or low back pain.  If you develop varicose veins, wear support hose. Elevate your feet for 15 minutes, 3-4 times a day. Limit salt in your diet. Prenatal Care  Write down your questions. Take them to your prenatal visits.  Keep all your prenatal visits as  told by your health care provider. This is important. Safety  Wear your seat belt at all times when driving.  Make a list of emergency phone numbers, including numbers for family, friends, the hospital, and police and fire departments. General instructions  Ask your health care provider for a referral to a local prenatal education class. Begin classes no later than the beginning of month 6 of your pregnancy.  Ask for help if you have counseling or nutritional needs during pregnancy. Your health care provider can offer advice or refer you to specialists for help with various needs.  Do not use hot tubs, steam rooms, or saunas.  Do not douche or use tampons or scented sanitary pads.  Do not cross your legs for long periods of time.  Avoid cat litter boxes and soil used by cats. These carry germs that can cause birth defects in the baby and possibly loss of the fetus by miscarriage or stillbirth.  Avoid all smoking, herbs, alcohol, and unprescribed drugs. Chemicals in these products can affect the formation and growth of the baby.  Do not use any products that contain nicotine or tobacco, such as cigarettes and e-cigarettes. If you need help quitting, ask your health care provider.  Visit your dentist if you have not gone yet during your pregnancy. Use a soft toothbrush to brush your teeth and be gentle when you floss. Contact a health care provider if:  You have dizziness.  You have mild pelvic cramps, pelvic pressure, or nagging pain in the abdominal area.  You have persistent nausea, vomiting, or diarrhea.  You have a bad smelling vaginal discharge.  You have pain when you urinate. Get help right away if:  You have a fever.  You are leaking fluid from your vagina.  You have spotting or bleeding from your vagina.  You have severe abdominal cramping or pain.  You have rapid weight gain or weight loss.  You have shortness of breath with chest pain.  You notice sudden or  extreme swelling of your face, hands, ankles, feet, or legs.  You have not felt your baby move in over an hour.  You have severe headaches that do not go away when you take medicine.  You have vision changes. Summary  The second trimester is from week 14 through week 27 (months 4 through 6). It is also a time when the fetus is growing rapidly.  Your body goes through many changes during pregnancy. The changes vary from woman to woman.  Avoid all smoking, herbs,  alcohol, and unprescribed drugs. These chemicals affect the formation and growth your baby.  Do not use any tobacco products, such as cigarettes, chewing tobacco, and e-cigarettes. If you need help quitting, ask your health care provider.  Contact your health care provider if you have any questions. Keep all prenatal visits as told by your health care provider. This is important. This information is not intended to replace advice given to you by your health care provider. Make sure you discuss any questions you have with your health care provider. Document Released: 05/04/2001 Document Revised: 06/15/2016 Document Reviewed: 06/15/2016 Elsevier Interactive Patient Education  2019 [employer]. Exercise During Pregnancy For people of all ages, exercise is an important part of being healthy. Exercise improves heart and lung function and helps to maintain strength, flexibility, and a healthy body weight. Exercise also boosts energy levels and elevates mood. For most women, maintaining an exercise routine throughout pregnancy is recommended. It is only on rare occasions and with certain medical conditions or pregnancy complications that women may be asked to limit or avoid exercise during pregnancy. What are some other benefits to exercising during pregnancy? Along with maintaining strength and flexibility, exercising throughout pregnancy can help to:  Keep strength in muscles that are very important during labor and  childbirth.  Decrease low back pain during pregnancy.  Decrease the risk of developing gestational diabetes mellitus (GDM).  Improve blood sugar (glucose) control for women who have GDM.  Decrease the risk of developing preeclampsia. This is a serious condition that causes high blood pressure along with other symptoms, such as swelling and headaches.  Decrease the risk of cesarean delivery.  Speed up the recovery after giving birth. How often should I exercise? Unless your health care provider gives you different instructions, you should try to exercise on most days or all days of the week. In general, try to exercise with moderate intensity for about 150 minutes per week. This can be spread out across several days, such as exercising for 30 minutes per day on 5 days of each week. You can tell that you are exercising at a moderate intensity if you have a higher heart rate and faster breathing, but you are still able to hold a conversation. What types of moderate-intensity exercise are recommended during pregnancy? There are many types of exercise that are safe for you to do during pregnancy. Unless your health care provider gives you different instructions, do a variety of exercises that safely increase your heart and breathing (cardiopulmonary) rates and help you to build and maintain muscle strength (strength training). You should always be able to talk in full sentences while exercising during pregnancy. Some examples of exercising that is safe to do during pregnancy include:  Brisk walking or hiking.  Swimming.  Water aerobics.  Riding a stationary bike.  Strength training.  Modified yoga or Pilates. Tell your instructor that you are pregnant. Avoid overstretching and avoid lying on your back for long periods of time.  Running or jogging. Only choose this type of exercise if: ? You ran or jogged regularly before your pregnancy. ? You can run or jog and still talk in complete  sentences. What types of exercise should I not do during pregnancy? Depending on your level of fitness and whether you exercised regularly before your pregnancy, you may be advised to limit vigorous-intensity exercise during your pregnancy. You can tell that you are exercising at a vigorous intensity if you are breathing much harder and faster and cannot  hold a conversation while exercising. Some examples of exercising that you should avoid during pregnancy include:  Contact sports.  Activities that place you at risk for falling on or being hit in the belly, such as downhill skiing, water skiing, surfing, rock climbing, cycling, gymnastics, and horseback riding.  Scuba diving.  Sky diving.  Yoga or Pilates in a room that is heated to extreme temperatures ("hot yoga" or "hot Pilates").  Jogging or running, unless you ran or jogged regularly before your pregnancy. While jogging or running, you should always be able to talk in full sentences. Do not run or jog so vigorously that you are unable to have a conversation.  If you are not used to exercising at elevation (more than 6,000 feet above sea level), do not do so during your pregnancy. When should I avoid exercising during pregnancy? Certain medical conditions can make it unsafe to exercise during pregnancy, or they may increase your risk of miscarriage or early labor and birth. Some of these conditions include:  Some types of heart disease.  Some types of lung disease.  Placenta previa. This is when the placenta partially or completely covers the opening of the uterus (cervix).  Frequent bleeding from the vagina during your pregnancy.  Incompetent cervix. This is when your cervix does not remain as tightly closed during pregnancy as it should.  Premature labor.  Ruptured membranes. This is when the protective sac (amniotic sac) opens up and amniotic fluid leaks from your vagina.  Severely low blood count (anemia).  Preeclampsia  or pregnancy-caused high blood pressure.  Carrying more than one baby (multiple gestation) and having an additional risk of early labor.  Poorly controlled diabetes.  Being severely underweight or severely overweight.  Intrauterine growth restriction. This is when your baby's growth and development during pregnancy are slower than expected.  Other medical conditions. Ask your health care provider if any apply to you. What else should I know about exercising during pregnancy? You should take these precautions while exercising during pregnancy:  Avoid overheating. ? Wear loose-fitting, breathable clothes. ? Do not exercise in very high temperatures.  Avoid dehydration. Drink enough water before, during, and after exercise to keep your urine clear or pale yellow.  Avoid overstretching. Because of hormone changes during pregnancy, it is easy to overstretch muscles, tendons, and ligaments during pregnancy.  Start slowly and ask your health care provider to recommend types of exercise that are safe for you, if exercising regularly is new for you. Pregnancy is not a time for exercising to lose weight. When should I seek medical care? You should stop exercising and call your health care provider if you have any unusual symptoms, such as:  Mild uterine contractions or abdominal cramping.  Dizziness that does not improve with rest. When should I seek immediate medical care? You should stop exercising and call your local emergency services (911 in the [place] if you have any unusual symptoms, such as:  Sudden, severe pain in your low back or your belly.  Uterine contractions or abdominal cramping that do not improve with rest.  Chest pain.  Bleeding or fluid leaking from your vagina.  Shortness of breath. This information is not intended to replace advice given to you by your health care provider. Make sure you discuss any questions you have with your health care provider. Document  Released: 05/10/2005 Document Revised: 10/08/2015 Document Reviewed: 07/18/2014 Elsevier Interactive Patient Education  2019 [place] for Pregnant Women While you are pregnant,  your body requires additional nutrition to help support your growing baby. You also have a higher need for some vitamins and minerals, such as folic acid, calcium, iron, and vitamin D. Eating a healthy, well-balanced diet is very important for your health and your baby's health. Your need for extra calories varies for the three 38-monthsegments of your pregnancy (trimesters). For most women, it is recommended to consume:  150 extra calories a day during the first trimester.  300 extra calories a day during the second trimester.  300 extra calories a day during the third trimester. What are tips for following this plan?   Do not try to lose weight or go on a diet during pregnancy.  Limit your overall intake of foods that have "empty calories." These are foods that have little nutritional value, such as sweets, desserts, candies, and sugar-sweetened beverages.  Eat a variety of foods (especially fruits and vegetables) to get a full range of vitamins and minerals.  Take a prenatal vitamin to help meet your additional vitamin and mineral needs during pregnancy, specifically for folic acid, iron, calcium, and vitamin D.  Remember to stay active. Ask your health care provider what types of exercise and activities are safe for you.  Practice good food safety and cleanliness. Wash your hands before you eat and after you prepare raw meat. Wash all fruits and vegetables well before peeling or eating. Taking these actions can help to prevent food-borne illnesses that can be very dangerous to your baby, such as listeriosis. Ask your health care provider for more information about listeriosis. What does 150 extra calories look like? Healthy options that provide 150 extra calories each day could be any of the  following:  6-8 oz (170-230 g) of plain low-fat yogurt with  cup of berries.  1 apple with 2 teaspoons (11 g) of peanut butter.  Cut-up vegetables with  cup (60 g) of hummus.  8 oz (230 mL) or 1 cup of low-fat chocolate milk.  1 stick of string cheese with 1 medium orange.  1 peanut butter and jelly sandwich that is made with one slice of whole-wheat bread and 1 tsp (5 g) of peanut butter. For 300 extra calories, you could eat two of those healthy options each day. What is a healthy amount of weight to gain? The right amount of weight gain for you is based on your BMI before you became pregnant. If your BMI:  Was less than 18 (underweight), you should gain 28-40 lb (13-18 kg).  Was 18-24.9 (normal), you should gain 25-35 lb (11-16 kg).  Was 25-29.9 (overweight), you should gain 15-25 lb (7-11 kg).  Was 30 or greater (obese), you should gain 11-20 lb (5-9 kg). What if I am having twins or multiples? Generally, if you are carrying twins or multiples:  You may need to eat 300-600 extra calories a day.  The recommended range for total weight gain is 25-54 lb (11-25 kg), depending on your BMI before pregnancy.  Talk with your health care provider to find out about nutritional needs, weight gain, and exercise that is right for you. What foods can I eat?  Grains All grains. Choose whole grains, such as whole-wheat bread, oatmeal, or brown rice. Vegetables All vegetables. Eat a variety of colors and types of vegetables. Remember to wash your vegetables well before peeling or eating. Fruits All fruits. Eat a variety of colors and types of fruit. Remember to wash your fruits well before peeling or eating. Meats and  other protein foods Lean meats, including chicken, [place], fish, and lean cuts of beef, veal, or pork. If you eat fish or seafood, choose options that are higher in omega-3 fatty acids and lower in mercury, such as salmon, herring, mussels, trout, sardines, pollock,  shrimp, crab, and lobster. Tofu. Tempeh. Beans. Eggs. Peanut butter and other nut butters. Make sure that all meats, poultry, and eggs are cooked to food-safe temperatures or "well-done." Two or more servings of fish are recommended each week in order to get the most benefits from omega-3 fatty acids that are found in seafood. Choose fish that are lower in mercury. You can find more information online:  GuamGaming.ch Dairy Pasteurized milk and milk alternatives (such as almond milk). Pasteurized yogurt and pasteurized cheese. Cottage cheese. Sour cream. Beverages Water. Juices that contain 100% fruit juice or vegetable juice. Caffeine-free teas and decaffeinated coffee. Drinks that contain caffeine are okay to drink, but it is better to avoid caffeine. Keep your total caffeine intake to less than 200 mg each day (which is 12 oz or 355 mL of coffee, tea, or soda) or the limit as told by your health care provider. Fats and oils Fats and oils are okay to include in moderation. Sweets and desserts Sweets and desserts are okay to include in moderation. Seasoning and other foods All pasteurized condiments. The items listed above may not be a complete list of recommended foods and beverages. Contact your dietitian for more options. What foods are not recommended? Vegetables Raw (unpasteurized) vegetable juices. Fruits Unpasteurized fruit juices. Meats and other protein foods Lunch meats, bologna, hot dogs, or other deli meats. (If you must eat those meats, reheat them until they are steaming hot.) Refrigerated pat, meat spreads from a meat counter, smoked seafood that is found in the refrigerated section of a store. Raw or undercooked meats, poultry, and eggs. Raw fish, such as sushi or sashimi. Fish that have high mercury content, such as tilefish, shark, swordfish, and king mackerel. To learn more about mercury in fish, talk with your health care provider or look for online resources, such  as:  GuamGaming.ch Dairy Raw (unpasteurized) milk and any foods that have raw milk in them. Soft cheeses, such as feta, queso blanco, queso fresco, Brie, Camembert cheeses, blue-veined cheeses, and Panela cheese (unless it is made with pasteurized milk, which must be stated on the label). Beverages Alcohol. Sugar-sweetened beverages, such as sodas, teas, or energy drinks. Seasoning and other foods Homemade fermented foods and drinks, such as pickles, sauerkraut, or kombucha drinks. (Store-bought pasteurized versions of these are okay.) Salads that are made in a store or deli, such as ham salad, chicken salad, egg salad, tuna salad, and seafood salad. The items listed above may not be a complete list of foods and beverages to avoid. Contact your dietitian for more information. Where to find more information To calculate the number of calories you need based on your height, weight, and activity level, you can use an online calculator such as:  MobileTransition.ch To calculate how much weight you should gain during pregnancy, you can use an online pregnancy weight gain calculator such as:  StreamingFood.com.cy Summary  While you are pregnant, your body requires additional nutrition to help support your growing baby.  Eat a variety of foods, especially fruits and vegetables to get a full range of vitamins and minerals.  Practice good food safety and cleanliness. Wash your hands before you eat and after you prepare raw meat. Wash all fruits and vegetables well  before peeling or eating. Taking these actions can help to prevent food-borne illnesses, such as listeriosis, that can be very dangerous to your baby.  Do not eat raw meat or fish. Do not eat fish that have high mercury content, such as tilefish, shark, swordfish, and king mackerel. Do not eat unpasteurized (raw) dairy.  Take a prenatal vitamin to help meet your additional vitamin and mineral  needs during pregnancy, specifically for folic acid, iron, calcium, and vitamin D. This information is not intended to replace advice given to you by your health care provider. Make sure you discuss any questions you have with your health care provider. Document Released: 02/22/2014 Document Revised: 02/04/2017 Document Reviewed: 02/04/2017 Elsevier Interactive Patient Education  2019 [employer]. Prenatal Care Prenatal care is health care during pregnancy. It helps you and your unborn baby (fetus) stay as healthy as possible. Prenatal care may be provided by a midwife, a family practice health care provider, or a childbirth and pregnancy specialist (obstetrician). How does this affect me? During pregnancy, you will be closely monitored for any new conditions that might develop. To lower your risk of pregnancy complications, you and your health care provider will talk about any underlying conditions you have. How does this affect my baby? Early and consistent prenatal care increases the chance that your baby will be healthy during pregnancy. Prenatal care lowers the risk that your baby will be:  Born early (prematurely).  Smaller than expected at birth (small for gestational age). What can I expect at the first prenatal care visit? Your first prenatal care visit will likely be the longest. You should schedule your first prenatal care visit as soon as you know that you are pregnant. Your first visit is a good time to talk about any questions or concerns you have about pregnancy. At your visit, you and your health care provider will talk about:  Your medical history, including: ? Any past pregnancies. ? Your family's medical history. ? The baby's father's medical history. ? Any long-term (chronic) health conditions you have and how you manage them. ? Any surgeries or procedures you have had. ? Any current over-the-counter or prescription medicines, herbs, or supplements you are  taking.  Other factors that could pose a risk to your baby, including:  Your home setting and your stress levels, including: ? Exposure to abuse or violence. ? Household financial strain. ? Mental health conditions you have.  Your daily health habits, including diet and exercise. Your health care provider will also:  Measure your weight, height, and blood pressure.  Do a physical exam, including a pelvic and breast exam.  Perform blood tests and urine tests to check for: ? Urinary tract infection. ? Sexually transmitted infections (STIs). ? Low iron levels in your blood (anemia). ? Blood type and certain proteins on red blood cells (Rh antibodies). ? Infections and immunity to viruses, such as hepatitis B and rubella. ? HIV (human immunodeficiency virus).  Do an ultrasound to confirm your baby's growth and development and to help predict your estimated due date (EDD). This ultrasound is done with a probe that is inserted into the vagina (transvaginal ultrasound).  Discuss your options for genetic screening.  Give you information about how to keep yourself and your baby healthy, including: ? Nutrition and taking vitamins. ? Physical activity. ? How to manage pregnancy symptoms such as nausea and vomiting (morning sickness). ? Infections and substances that may be harmful to your baby and how to avoid them. ? Food  safety. ? Dental care. ? Working. ? Travel. ? Warning signs to watch for and when to call your health care provider. How often will I have prenatal care visits? After your first prenatal care visit, you will have regular visits throughout your pregnancy. The visit schedule is often as follows:  Up to week 28 of pregnancy: once every 4 weeks.  28-36 weeks: once every 2 weeks.  After 36 weeks: every week until delivery. Some women may have visits more or less often depending on any underlying health conditions and the health of the baby. Keep all follow-up and  prenatal care visits as told by your health care provider. This is important. What happens during routine prenatal care visits? Your health care provider will:  Measure your weight and blood pressure.  Check for fetal heart sounds.  Measure the height of your uterus in your abdomen (fundal height). This may be measured starting around week 20 of pregnancy.  Check the position of your baby inside your uterus.  Ask questions about your diet, sleeping patterns, and whether you can feel the baby move.  Review warning signs to watch for and signs of labor.  Ask about any pregnancy symptoms you are having and how you are dealing with them. Symptoms may include: ? Headaches. ? Nausea and vomiting. ? Vaginal discharge. ? Swelling. ? Fatigue. ? Constipation. ? Any discomfort, including back or pelvic pain. Make a list of questions to ask your health care provider at your routine visits. What tests might I have during prenatal care visits? You may have blood, urine, and imaging tests throughout your pregnancy, such as:  Urine tests to check for glucose, protein, or signs of infection.  Glucose tests to check for a form of diabetes that can develop during pregnancy (gestational diabetes mellitus). This is usually done around week 24 of pregnancy.  An ultrasound to check your baby's growth and development and to check for birth defects. This is usually done around week 20 of pregnancy.  A test to check for group B strep (GBS) infection. This is usually done around week 36 of pregnancy.  Genetic testing. This may include blood or imaging tests, such as an ultrasound. Some genetic tests are done during the first trimester and some are done during the second trimester. What else can I expect during prenatal care visits? Your health care provider may recommend getting certain vaccines during pregnancy. These may include:  A yearly flu shot (annual influenza vaccine). This is especially  important if you will be pregnant during flu season.  Tdap (tetanus, diphtheria, pertussis) vaccine. Getting this vaccine during pregnancy can protect your baby from whooping cough (pertussis) after birth. This vaccine may be recommended between weeks 27 and 36 of pregnancy. Later in your pregnancy, your health care provider may give you information about:  Childbirth and breastfeeding classes.  Choosing a health care provider for your baby.  Umbilical cord banking.  Breastfeeding.  Birth control after your baby is born.  The hospital labor and delivery unit and how to tour it.  Registering at the hospital before you go into labor. Where to find more information  Office on Women's Health: LegalWarrants.gl  American Pregnancy Association: americanpregnancy.org  March of Dimes: marchofdimes.org Summary  Prenatal care helps you and your baby stay as healthy as possible during pregnancy.  Your first prenatal care visit will most likely be the longest.  You will have visits and tests throughout your pregnancy to monitor your health and your baby's  health.  Bring a list of questions to your visits to ask your health care provider.  Make sure to keep all follow-up and prenatal care visits with your health care provider. This information is not intended to replace advice given to you by your health care provider. Make sure you discuss any questions you have with your health care provider. Document Released: 05/13/2003 Document Revised: 05/09/2017 Document Reviewed: 05/09/2017 Elsevier Interactive Patient Education  2019 [employer] Inc. Hyperemesis Gravidarum Hyperemesis gravidarum is a severe form of nausea and vomiting that happens during pregnancy. Hyperemesis is worse than morning sickness. It may cause you to have nausea or vomiting all day for many days. It may keep you from eating and drinking enough food and liquids, which can lead to dehydration, malnutrition, and weight loss.  Hyperemesis usually occurs during the first half (the first 20 weeks) of pregnancy. It often goes away once a woman is in her second half of pregnancy. However, sometimes hyperemesis continues through an entire pregnancy. What are the causes? The cause of this condition is not known. It may be related to changes in chemicals (hormones) in the body during pregnancy, such as the high level of pregnancy hormone (human chorionic gonadotropin) or the increase in the female sex hormone (estrogen). What are the signs or symptoms? Symptoms of this condition include:  Nausea that does not go away.  Vomiting that does not allow you to keep any food down.  Weight loss.  Body fluid loss (dehydration).  Having no desire to eat, or not liking food that you have previously enjoyed. How is this diagnosed? This condition may be diagnosed based on:  A physical exam.  Your medical history.  Your symptoms.  Blood tests.  Urine tests. How is this treated? This condition is managed by controlling symptoms. This may include:  Following an eating plan. This can help lessen nausea and vomiting.  Taking prescription medicines. An eating plan and medicines are often used together to help control symptoms. If medicines do not help relieve nausea and vomiting, you may need to receive fluids through an IV at the hospital. Follow these instructions at home: Eating and drinking   Avoid the following: ? Drinking fluids with meals. Try not to drink anything during the 30 minutes before and after your meals. ? Drinking more than 1 cup of fluid at a time. ? Eating foods that trigger your symptoms. These may include spicy foods, coffee, high-fat foods, very sweet foods, and acidic foods. ? Skipping meals. Nausea can be more intense on an empty stomach. If you cannot tolerate food, do not force it. Try sucking on ice chips or other frozen items and make up for missed calories later. ? Lying down within 2 hours  after eating. ? Being exposed to environmental triggers. These may include food smells, smoky rooms, closed spaces, rooms with strong smells, warm or humid places, overly loud and noisy rooms, and rooms with motion or flickering lights. Try eating meals in a well-ventilated area that is free of strong smells. ? Quick and sudden changes in your movement. ? Taking iron pills and multivitamins that contain iron. If you take prescription iron pills, do not stop taking them unless your health care provider approves. ? Preparing food. The smell of food can spoil your appetite or trigger nausea.  To help relieve your symptoms: ? Listen to your body. Everyone is different and has different preferences. Find what works best for you. ? Eat and drink slowly. ? Eat 5-6  small meals daily instead of 3 large meals. Eating small meals and snacks can help you avoid an empty stomach. ? In the morning, before getting out of bed, eat a couple of crackers to avoid moving around on an empty stomach. ? Try eating starchy foods as these are usually tolerated well. Examples include cereal, toast, bread, potatoes, pasta, rice, and pretzels. ? Include at least 1 serving of protein with your meals and snacks. Protein options include lean meats, poultry, seafood, beans, nuts, nut butters, eggs, cheese, and yogurt. ? Try eating a protein-rich snack before bed. Examples of a protein-rick snack include cheese and crackers or a peanut butter sandwich made with 1 slice of whole-wheat bread and 1 tsp (5 g) of peanut butter. ? Eat or suck on things that have ginger in them. It may help relieve nausea. Add  tsp ground ginger to hot tea or choose ginger tea. ? Try drinking 100% fruit juice or an electrolyte drink. An electrolyte drink contains sodium, potassium, and chloride. ? Drink fluids that are cold, clear, and carbonated or sour. Examples include lemonade, ginger ale, lemon-lime soda, ice water, and sparkling water. ? Brush your  teeth or use a mouth rinse after meals. ? Talk with your health care provider about starting a supplement of vitamin B6. General instructions  Take over-the-counter and prescription medicines only as told by your health care provider.  Follow instructions from your health care provider about eating or drinking restrictions.  Continue to take your prenatal vitamins as told by your health care provider. If you are having trouble taking your prenatal vitamins, talk with your health care provider about different options.  Keep all follow-up and pre-birth (prenatal) visits as told by your health care provider. This is important. Contact a health care provider if:  You have pain in your abdomen.  You have a severe headache.  You have vision problems.  You are losing weight.  You feel weak or dizzy. Get help right away if:  You cannot drink fluids without vomiting.  You vomit blood.  You have constant nausea and vomiting.  You are very weak.  You faint.  You have a fever and your symptoms suddenly get worse. Summary  Hyperemesis gravidarum is a severe form of nausea and vomiting that happens during pregnancy.  Making some changes to your eating habits may help relieve nausea and vomiting.  This condition may be managed with medicine.  If medicines do not help relieve nausea and vomiting, you may need to receive fluids through an IV at the hospital. This information is not intended to replace advice given to you by your health care provider. Make sure you discuss any questions you have with your health care provider. Document Released: 05/10/2005 Document Revised: 05/30/2017 Document Reviewed: 01/07/2016 Elsevier Interactive Patient Education  2019 [employer].    COVID-19 and Your Pregnancy FAQ  How can I prevent infection with COVID-19 during my pregnancy? Social distancing is key. Please limit any interactions in public. Try and work from home if  possible. Frequently wash your hands after touching possibly contaminated surfaces. Avoid touching your face.  Minimize trips to the store. Consider online ordering when possible.   Should I wear a mask? YES. It is recommended by the CDC that all people wear a cloth mask or facial covering in public. You should wear a mask to your visits in the office. This will help reduce transmission as well as your risk or acquiring COVID-19. New studies are showing  that even asymptomatic individuals can spread the virus from talking.   Where can I get a mask? [place] and the city of [place] are partnering to provide masks to community members. You can pick up a mask from several locations. This website also has instructions about how to make a mask by sewing or without sewing by using a t-shirt or bandana.  https://www.[place].gov/i-want-to/learn-about/covid-19-information-and-updates/covid-19-face-mask-project  Studies have shown that if you were a tube or nylon stocking from pantyhose over a cloth mask it makes the cloth mask almost as effective as a N95 mask.  https://www.davis-walter.com/  What are the symptoms of COVID-19? Fever (greater than 100.4 F), dry cough, shortness of breath.  Am I more at risk for COVID-19 since I am pregnant? There is not currently data showing that pregnant women are more adversely impacted by COVID-19 than the general population. However, we know that pregnant women tend to have worse respiratory complications from similar diseases such as the flu and SARS and for this reason should be considered an at-risk population.  What do I do if I am experiencing the symptoms of COVID-19? Testing is being limited because of test availability. If you are experiencing symptoms you should quarantine yourself, and the members of your family, for at least 2 weeks  at home.   Please visit this website for more information: RunningShows.co.za.html  When should I go to the Emergency Room? Please go to the emergency room if you are experiencing ANY of these symptoms*:  1.    Difficulty breathing or shortness of breath 2.    Persistent pain or pressure in the chest 3.    Confusion or difficulty being aroused (or awakened) 4.    Bluish lips or face  *This list is not all inclusive. Please consult our office for any other symptoms that are severe or concerning.  What do I do if I am having difficulty breathing? You should go to the Emergency Room for evaluation. At this time they have a tent set up for evaluating patients with COVID-19 symptoms.   How will my prenatal care be different because of the COVID-19 pandemic? It has been recommended to reduce the frequency of face-to-face visits and use resources such as telephone and virtual visits when possible. Using a scale, blood pressure machine and fetal doppler at home can further help reduce face-to-face visits. You will be provided with additional information on this topic.  We ask that you come to your visits alone to minimize potential exposures to  COVID-19.  How can I receive childbirth education? At this time in-person classes have been cancelled. You can register for online childbirth education, breastfeeding, and newborn care classes.  Please visit:  CyberComps.hu for more information  How will my hospital birth experience be different? The hospital is currently limiting visitors. This means that while you are in labor you can only have one person at the hospital with you. Additional family members will not be allowed to wait in the building or outside your room. Your one support person can be the father of the baby, a relative, a doula, or a friend. Once one support person is designated that person will wear a band. This band  cannot be shared with multiple people.  Nitrous Gas is not being offered for pain relief since the tubing and filter for the machine can not be sanitized in a way to guarantee prevention of transmission of COVID-19.  Nasal cannula use of oxygen for fetal indications has also been discontinued.  Currently a clear plastic sheet is being hung between mom and the delivering provider during pushing and delivery to help prevent transmission of COVID-19.      How long will I stay in the hospital for after giving birth? It is also recommended that discharge home be expedited during the COVID-19 outbreak. This means staying for 1 day after a vaginal delivery and 2 days after a cesarean section. Patients who need to stay longer for medical reasons are allowed to do so, but the goal will be for expedited discharge home.   What if I have COVID-19 and I am in labor? We ask that you wear a mask while on labor and delivery. We will try and accommodate you being placed in a room that is capable of filtering the air. Please call ahead if you are in labor and on your way to the hospital. The phone number for labor and delivery at [facility] is [phone].  If I have COVID-19 when my baby is born how can I prevent my baby from contracting COVID-19? This is an issue that will have to be discussed on a case-by-case basis. Current recommendations suggest providing separate isolation rooms for both the mother and new infant as well as limiting visitors. However, there are practical challenges to this recommendation. The situation will assuredly change and decisions will be influenced by the desires of the mother and availability of space.  Some suggestions are the use of a curtain or physical barrier between mom and infant, hand hygiene, mom wearing a mask, or 6 feet of spacing between a mom and infant.   Can I breastfeed during the COVID-19 pandemic?   Yes, breastfeeding is encouraged.  Can I  breastfeed if I have COVID-19? Yes. Covid-19 has not been found in breast milk. This means you cannot give COVID-19 to your child through breast milk. Breast feeding will also help pass antibodies to fight infection to your baby.   What precautions should I take when breastfeeding if I have COVID-19? If a mother and newborn do room-in and the mother wishes to feed at the breast, she should put on a facemask and practice hand hygiene before each feeding.  What precautions should I take when pumping if I have COVID-19? Prior to expressing breast milk, mothers should practice hand hygiene. After each pumping session, all parts that come into contact with breast milk should be thoroughly washed and the entire pump should be appropriately disinfected per the manufacturers instructions. This expressed breast milk should be fed to the newborn by a healthy caregiver.  What if I am pregnant and work in healthcare? Based on limited data regarding COVID-19 and pregnancy, ACOG currently does not propose creating additional restrictions on pregnant health care personnel because of COVID-19 alone. Pregnant women do not appear to be at higher risk of severe disease related to COVID-19. Pregnant health care personnel should follow CDC risk assessment and infection control guidelines for health care personnel exposed to patients with suspected or confirmed COVID-19. Adherence to recommended infection prevention and control practices is an important part of protecting all health care personnel in health care settings.    Information on COVID-19 in pregnancy is very limited; however, facilities may want to consider limiting exposure of pregnant health care personnel to patients with confirmed or suspected COVID-19 infection, especially during higher-risk procedures (eg, aerosol-generating procedures), if feasible, based on staffing availability.  Common Medications Safe in Pregnancy  Acne:      Constipation:  Benzoyl  Peroxide     Colace  Clindamycin      Dulcolax Suppository  Topica Erythromycin     Fibercon  Salicylic Acid      Metamucil         Miralax AVOID:        Senakot   Accutane    Cough:  Retin-A       Cough Drops  Tetracycline      Phenergan w/ Codeine if Rx  Minocycline      Robitussin (Plain & DM)  Antibiotics:     Crabs/Lice:  Ceclor       RID  Cephalosporins    AVOID:  E-Mycins      Kwell  Keflex  Macrobid/Macrodantin   Diarrhea:  Penicillin      Kao-Pectate  Zithromax      Imodium AD         PUSH FLUIDS AVOID:       Cipro     Fever:  Tetracycline      Tylenol (Regular or Extra  Minocycline       Strength)  Levaquin      Extra Strength-Do not          Exceed 8 tabs/24 hrs Caffeine:        <288m/day (equiv. To 1 cup of coffee or  approx. 3 12 oz sodas)         Gas: Cold/Hayfever:       Gas-X  Benadryl      Mylicon  Claritin       Phazyme  **Claritin-D        Chlor-Trimeton    Headaches:  Dimetapp      ASA-Free Excedrin  Drixoral-Non-Drowsy     Cold Compress  Mucinex (Guaifenasin)     Tylenol (Regular or Extra  Sudafed/Sudafed-12 Hour     Strength)  **Sudafed PE Pseudoephedrine   Tylenol Cold & Sinus     Vicks Vapor Rub  Zyrtec  **AVOID if Problems With Blood Pressure         Heartburn: Avoid lying down for at least 1 hour after meals  Aciphex      Maalox     Rash:  Milk of Magnesia     Benadryl    Mylanta       1% Hydrocortisone Cream  Pepcid  Pepcid Complete   Sleep Aids:  Prevacid      Ambien   Prilosec       Benadryl  Rolaids       Chamomile Tea  Tums (Limit 4/day)     Unisom  Zantac       Tylenol PM         Warm milk-add vanilla or  Hemorrhoids:       Sugar for taste  Anusol/Anusol H.C.  (RX: Analapram 2.5%)  Sugar Substitutes:  Hydrocortisone OTC     Ok in moderation  Preparation H      Tucks        Vaseline lotion applied to tissue with wiping    Herpes:     Throat:  Acyclovir      Oragel  Famvir  Valtrex     Vaccines:         Flu  Shot Leg Cramps:       *Gardasil  Benadryl      Hepatitis A         Hepatitis B Nasal Spray:       Pneumovax  Saline Nasal Spray  Polio Booster         Tetanus Nausea:       Tuberculosis test or PPD  Vitamin B6 25 mg TID   AVOID:    Dramamine      *Gardasil  Emetrol       Live Poliovirus  Ginger Root 250 mg QID    MMR (measles, mumps &  High Complex Carbs @ Bedtime    rebella)  Sea Bands-Accupressure    Varicella (Chickenpox)  Unisom 1/2 tab TID     *No known complications           If received before Pain:         Known pregnancy;   Darvocet       Resume series after  Lortab        Delivery  Percocet    Yeast:   Tramadol      Femstat  Tylenol 3      Gyne-lotrimin  Ultram       Monistat  Vicodin           MISC:         All Sunscreens           Hair Coloring/highlights          Insect Repellant's          (Including DEET)         Mystic Tans  Perinatal Depression When a woman feels excessive sadness, anger, or anxiety during pregnancy or during the first 12 months after she gives birth, she has a condition called perinatal depression. Depression can interfere with work, school, relationships, and other everyday activities. If it is not managed properly, it can also cause problems in the mother and her baby. Sometimes, perinatal depression is left untreated because symptoms are thought to be normal mood swings during and right after pregnancy. If you have symptoms of depression, it is important to talk with your health care provider. What are the causes? The exact cause of this condition is not known. Hormonal changes during and after pregnancy may play a role in causing perinatal depression. What increases the risk? You are more likely to develop this condition if:  You have a personal or family history of depression, anxiety, or mood disorders.  You experience a stressful life event during pregnancy, such as the death of a loved one.  You have a lot of regular life  stress.  You do not have support from family members or loved ones, or you are in an abusive relationship. What are the signs or symptoms? Symptoms of this condition include:  Feeling sad or hopeless.  Feelings of guilt.  Feeling irritable or overwhelmed.  Changes in your appetite.  Lack of energy or motivation.  Sleep problems.  Difficulty concentrating or completing tasks.  Loss of interest in hobbies or relationships.  Headaches or stomach problems that do not go away. How is this diagnosed? This condition is diagnosed based on a physical exam and mental evaluation. In some cases, your health care provider may use a depression screening tool. These tools include a list of questions that can help a health care provider diagnose depression. Your health care provider may refer you to a mental health expert who specializes in depression. How is this treated? This condition may be treated with:  Medicines. Your health care provider will only give you medicines that have been proven safe for pregnancy and breastfeeding.  Talk therapy with a mental health professional to help change  your patterns of thinking (cognitive behavioral therapy).  Support groups.  Brain stimulation or light therapies.  Stress reduction therapies, such as mindfulness. Follow these instructions at home: Lifestyle  Do not use any products that contain nicotine or tobacco, such as cigarettes and e-cigarettes. If you need help quitting, ask your health care provider.  Do not use alcohol when you are pregnant. After your baby is born, limit alcohol intake to no more than 1 drink a day. One drink equals 12 oz of beer, 5 oz of wine, or 1 oz of hard liquor.  Consider joining a support group for new mothers. Ask your health care provider for recommendations.  Take good care of yourself. Make sure you: ? Get plenty of sleep. If you are having trouble sleeping, talk with your health care provider. ? Eat a  healthy diet. This includes plenty of fruits and vegetables, whole grains, and lean proteins. ? Exercise regularly, as told by your health care provider. Ask your health care provider what exercises are safe for you. General instructions  Take over-the-counter and prescription medicines only as told by your health care provider.  Talk with your partner or family members about your feelings during pregnancy. Share any concerns or anxieties that you may have.  Ask for help with tasks or chores when you need it. Ask friends and family members to provide meals, watch your children, or help with cleaning.  Keep all follow-up visits as told by your health care provider. This is important. Contact a health care provider if:  You (or people close to you) notice that you have any symptoms of depression.  You have depression and your symptoms get worse.  You experience side effects from medicines, such as nausea or sleep problems. Get help right away if:  You feel like hurting yourself, your baby, or someone else. If you ever feel like you may hurt yourself or others, or have thoughts about taking your own life, get help right away. You can go to your nearest emergency department or call:  Your local emergency services (911 in the [place]  A suicide crisis helpline, such as the [place] at 617 321 3269. This is open 24 hours a day. Summary  Perinatal depression is when a woman feels excessive sadness, anger, or anxiety during pregnancy or during the first 12 months after she gives birth.  If perinatal depression is not treated, it can lead to health problems for the mother and her baby.  This condition is treated with medicines, talk therapy, stress reduction therapies, or a combination of two or more treatments.  Talk with your partner or family members about your feelings. Do not be afraid to ask for help. This information is not intended to replace advice  given to you by your health care provider. Make sure you discuss any questions you have with your health care provider. Document Released: 07/07/2016 Document Revised: 07/07/2016 Document Reviewed: 07/07/2016 Elsevier Interactive Patient Education  [employer].

## 2018-09-28 NOTE — Progress Notes (Signed)
ROB/MSAFP- no concerns

## 2018-09-28 NOTE — Progress Notes (Signed)
  Routine Prenatal Care Visit  Subjective  Debra Hinton is a 23 y.o. G2P1001 at [redacted]w[redacted]d being seen today for ongoing prenatal care.  She is currently monitored for the following issues for this high-risk pregnancy and has Irritable bowel syndrome with constipation; Generalized anxiety disorder; Vitamin D deficiency; Hyperthyroidism; GERD (gastroesophageal reflux disease); Supervision of high risk pregnancy, antepartum; Maternal obesity, antepartum; [redacted] weeks gestation of pregnancy; and Graves disease on their problem list.  ----------------------------------------------------------------------------------- Patient reports doing better on her current dose of zofran. She is now eating and she admits having gained a few pounds from her weight loss low. She admits good emotional health since she has discontinued her anti depressant and anti anxiety meds. She has followed up with her endocrinologist and is currently off PTU.    .  .   . Denies leaking of fluid.  ----------------------------------------------------------------------------------- The following portions of the patient's history were reviewed and updated as appropriate: allergies, current medications, past family history, past medical history, past social history, past surgical history and problem list. Problem list updated.   Objective  Blood pressure 104/64, weight 173 lb (78.5 kg), last menstrual period 05/31/2018. Pregravid weight 185 lb (83.9 kg) Total Weight Gain -12 lb (-5.443 kg) Urinalysis: Urine Protein Negative  Urine Glucose Negative  Fetal Status:           General:  Alert, oriented and cooperative. Patient is in no acute distress.  Skin: Skin is warm and dry. No rash noted.   Cardiovascular: Normal heart rate noted  Respiratory: Normal respiratory effort, no problems with respiration noted  Abdomen: Soft, gravid, appropriate for gestational age.       Pelvic:  Cervical exam deferred        Extremities: Normal range  of motion.     Mental Status: Normal mood and affect. Normal behavior. Normal judgment and thought content.   Assessment   23 y.o. G2P1001 at [redacted]w[redacted]d by  03/14/2019, by Ultrasound presenting for routine prenatal visit  Plan   Pregnancy#2 Problems (from 05/12/18 to present)    No problems associated with this episode.       Preterm labor symptoms and general obstetric precautions including but not limited to vaginal bleeding, contractions, leaking of fluid and fetal movement were reviewed in detail with the patient. Please refer to After Visit Summary for other counseling recommendations.   Return in about 4 weeks (around 10/26/2018) for anatomy scan and rob.  Tresea Mall, CNM 09/28/2018 10:04 AM

## 2018-09-30 LAB — AFP, SERUM, OPEN SPINA BIFIDA
AFP MoM: 1.06
AFP Value: 31.4 ng/mL
Gest. Age on Collection Date: 16 weeks
Maternal Age At EDD: 23.4 yr
OSBR Risk 1 IN: 10000
Test Results:: NEGATIVE
Weight: 173 [lb_av]

## 2018-10-02 ENCOUNTER — Telehealth: Payer: Self-pay

## 2018-10-02 NOTE — Telephone Encounter (Signed)
Pt calling for results.  [phone]  Pt aware of negative AFP results.

## 2018-11-01 ENCOUNTER — Other Ambulatory Visit: Payer: Self-pay

## 2018-11-01 ENCOUNTER — Ambulatory Visit (INDEPENDENT_AMBULATORY_CARE_PROVIDER_SITE_OTHER): Payer: Commercial Managed Care - PPO | Admitting: Maternal Newborn

## 2018-11-01 ENCOUNTER — Ambulatory Visit (INDEPENDENT_AMBULATORY_CARE_PROVIDER_SITE_OTHER): Payer: Commercial Managed Care - PPO

## 2018-11-01 ENCOUNTER — Other Ambulatory Visit: Payer: Self-pay | Admitting: Certified Nurse Midwife

## 2018-11-01 ENCOUNTER — Encounter: Payer: Self-pay | Admitting: Maternal Newborn

## 2018-11-01 VITALS — BP 114/70 | Wt 172.0 lb

## 2018-11-01 DIAGNOSIS — O219 Vomiting of pregnancy, unspecified: Secondary | ICD-10-CM | POA: Insufficient documentation

## 2018-11-01 DIAGNOSIS — O0990 Supervision of high risk pregnancy, unspecified, unspecified trimester: Secondary | ICD-10-CM

## 2018-11-01 DIAGNOSIS — Z3A21 21 weeks gestation of pregnancy: Secondary | ICD-10-CM

## 2018-11-01 DIAGNOSIS — Z363 Encounter for antenatal screening for malformations: Secondary | ICD-10-CM | POA: Diagnosis not present

## 2018-11-01 MED ORDER — ONDANSETRON 4 MG PO TBDP: 4.0000 mg | ORAL_TABLET | Freq: Three times a day (TID) | ORAL | 2 refills | Status: DC | PRN

## 2018-11-01 NOTE — Progress Notes (Signed)
No vb. No lof. Anatomy scan today. 

## 2018-11-01 NOTE — Patient Instructions (Signed)

## 2018-11-01 NOTE — Progress Notes (Signed)
    Routine Prenatal Care Visit  Subjective  Debra Hinton is a 23 y.o. G2P1001 at [redacted]w[redacted]d being seen today for ongoing prenatal care.  She is currently monitored for the following issues for this high-risk pregnancy and has Irritable bowel syndrome with constipation; Generalized anxiety disorder; Vitamin D deficiency; Hyperthyroidism; GERD (gastroesophageal reflux disease); Supervision of high risk pregnancy, antepartum; Maternal obesity, antepartum; Graves disease; and Pregnancy related nausea and vomiting, antepartum on their problem list.  ----------------------------------------------------------------------------------- Patient reports that nausea/vomiting are under control with Zofran.  Contractions: Not present. Vag. Bleeding: None.  Movement: Present. No leaking of fluid.  ----------------------------------------------------------------------------------- The following portions of the patient's history were reviewed and updated as appropriate: allergies, current medications, past family history, past medical history, past social history, past surgical history and problem list. Problem list updated.   Objective  Blood pressure 114/70, weight 172 lb (78 kg), last menstrual period 05/31/2018. Pregravid weight 185 lb (83.9 kg) Total Weight Gain -13 lb (-5.897 kg)  Fetal Status: Fetal Heart Rate (bpm): 148   Movement: Present     General:  Alert, oriented and cooperative. Patient is in no acute distress.  Skin: Skin is warm and dry. No rash noted.   Cardiovascular: Normal heart rate noted  Respiratory: Normal respiratory effort, no problems with respiration noted  Abdomen: Soft, gravid, appropriate for gestational age. Pain/Pressure: Absent     Pelvic:  Cervical exam deferred        Extremities: Normal range of motion.  Edema: None  Mental Status: Normal mood and affect. Normal behavior. Normal judgment and thought content.    Assessment   23 y.o. G2P1001 at [redacted]w[redacted]d, EDD  03/14/2019 by Ultrasound presenting for a routine prenatal visit.  Plan   Pregnancy#2 Problems (from 05/12/18 to present)    No problems associated with this episode.    Anatomy scan complete today with normal findings, breech presentation.  Refill Rx for Zofran.  Please refer to After Visit Summary for other counseling recommendations.   Return in about 4 weeks (around 11/29/2018) for ROB.  Avel Sensor, CNM 11/01/2018  1:39 PM

## 2018-11-22 ENCOUNTER — Telehealth: Payer: Self-pay

## 2018-11-22 NOTE — Telephone Encounter (Signed)
Called and note is in [place].

## 2018-11-22 NOTE — Telephone Encounter (Signed)
Pt calling; is 24wks; has been exposed to someone who tested positive for covid-19; front desk told her to call HD; has note from HD; now has elevated temp, h/a x3d, dizzy to the point her eye cross.  Needs note for work.  [phone]

## 2018-11-27 ENCOUNTER — Telehealth: Payer: Self-pay

## 2018-11-27 NOTE — Telephone Encounter (Signed)
Called pt to let her know her note is ready; did she want to p/u or have me fax it.  Pt states if she can get thru MyChart she can send it to her boss that way.

## 2018-11-27 NOTE — Telephone Encounter (Signed)
Letter entered into chart.

## 2018-11-27 NOTE — Telephone Encounter (Signed)
Pt calling; tested negative for Covid-19; now needs note to go back to work.  [phone]

## 2018-11-29 ENCOUNTER — Ambulatory Visit (INDEPENDENT_AMBULATORY_CARE_PROVIDER_SITE_OTHER): Payer: Commercial Managed Care - PPO | Admitting: Maternal Newborn

## 2018-11-29 ENCOUNTER — Other Ambulatory Visit: Payer: Self-pay

## 2018-11-29 ENCOUNTER — Encounter: Payer: Self-pay | Admitting: Maternal Newborn

## 2018-11-29 VITALS — BP 126/80 | Wt 175.0 lb

## 2018-11-29 DIAGNOSIS — Z3A25 25 weeks gestation of pregnancy: Secondary | ICD-10-CM

## 2018-11-29 DIAGNOSIS — O219 Vomiting of pregnancy, unspecified: Secondary | ICD-10-CM

## 2018-11-29 LAB — POCT URINALYSIS DIPSTICK OB
Glucose, UA: NEGATIVE
POC,PROTEIN,UA: NEGATIVE

## 2018-11-29 NOTE — Patient Instructions (Signed)

## 2018-11-29 NOTE — Progress Notes (Signed)
ROB- no concerns 

## 2018-11-29 NOTE — Progress Notes (Signed)
    Routine Prenatal Care Visit  Subjective  Debra Hinton is a 23 y.o. G2P1001 at [redacted]w[redacted]d being seen today for ongoing prenatal care.  She is currently monitored for the following issues for this high-risk pregnancy and has Irritable bowel syndrome with constipation; Generalized anxiety disorder; Vitamin D deficiency; Hyperthyroidism; GERD (gastroesophageal reflux disease); Supervision of high risk pregnancy, antepartum; Maternal obesity, antepartum; Graves disease; and Pregnancy related nausea and vomiting, antepartum on their problem list.  ----------------------------------------------------------------------------------- Patient reports one episode of dizziness about a week and a half ago at work. Resolved after drinking a lot of water.  Has a raised area on her left arm where a yellow jacket stung her yesterday. Contractions: Not present. Vag. Bleeding: None.  Movement: Present. No leaking of fluid.  ----------------------------------------------------------------------------------- The following portions of the patient's history were reviewed and updated as appropriate: allergies, current medications, past family history, past medical history, past social history, past surgical history and problem list. Problem list updated.   Objective  Blood pressure 126/80, weight 175 lb (79.4 kg), last menstrual period 05/31/2018. Pregravid weight 185 lb (83.9 kg) Total Weight Gain -10 lb (-4.536 kg) Urinalysis: Urine dipstick shows negative for glucose, protein.  Fetal Status: Fetal Heart Rate (bpm): 142   Movement: Present     General:  Alert, oriented and cooperative. Patient is in no acute distress.  Skin: Skin is warm and dry. No rash noted.   Cardiovascular: Normal heart rate noted  Respiratory: Normal respiratory effort, no problems with respiration noted  Abdomen: Soft, gravid, appropriate for gestational age. Pain/Pressure: Absent     Pelvic:  Cervical exam deferred         Extremities: Normal range of motion.     Mental Status: Normal mood and affect. Normal behavior. Normal judgment and thought content.     Assessment   23 y.o. G2P1001 at [redacted]w[redacted]d, EDD 03/14/2019, by Ultrasound presenting for a routine prenatal visit.  Plan   Pregnancy#2 Problems (from 05/12/18 to present)    No problems associated with this episode.    OTC hydrocortisone cream for relief of sting on arm.  Please refer to After Visit Summary for other counseling recommendations.   Return in about 3 weeks (around 12/20/2018) for ROB with GTT/labs.  Avel Sensor, CNM 11/29/2018  10:59 AM

## 2018-12-20 ENCOUNTER — Other Ambulatory Visit: Payer: Self-pay

## 2018-12-20 ENCOUNTER — Encounter: Payer: Self-pay | Admitting: Emergency Medicine

## 2018-12-20 ENCOUNTER — Other Ambulatory Visit: Payer: Self-pay | Admitting: Obstetrics & Gynecology

## 2018-12-20 ENCOUNTER — Ambulatory Visit (INDEPENDENT_AMBULATORY_CARE_PROVIDER_SITE_OTHER): Payer: Commercial Managed Care - PPO | Admitting: Advanced Practice Midwife

## 2018-12-20 ENCOUNTER — Encounter: Payer: Self-pay | Admitting: Advanced Practice Midwife

## 2018-12-20 ENCOUNTER — Other Ambulatory Visit: Payer: Commercial Managed Care - PPO

## 2018-12-20 VITALS — BP 122/78 | Wt 182.0 lb

## 2018-12-20 DIAGNOSIS — R0789 Other chest pain: Secondary | ICD-10-CM | POA: Diagnosis present

## 2018-12-20 DIAGNOSIS — Z5321 Procedure and treatment not carried out due to patient leaving prior to being seen by health care provider: Secondary | ICD-10-CM | POA: Diagnosis not present

## 2018-12-20 DIAGNOSIS — Z130 Encounter for screening for diseases of the blood and blood-forming organs and certain disorders involving the immune mechanism: Secondary | ICD-10-CM

## 2018-12-20 DIAGNOSIS — O99213 Obesity complicating pregnancy, third trimester: Secondary | ICD-10-CM

## 2018-12-20 DIAGNOSIS — Z131 Encounter for screening for diabetes mellitus: Secondary | ICD-10-CM

## 2018-12-20 DIAGNOSIS — O0990 Supervision of high risk pregnancy, unspecified, unspecified trimester: Secondary | ICD-10-CM

## 2018-12-20 DIAGNOSIS — Z113 Encounter for screening for infections with a predominantly sexual mode of transmission: Secondary | ICD-10-CM

## 2018-12-20 DIAGNOSIS — Z3A28 28 weeks gestation of pregnancy: Secondary | ICD-10-CM

## 2018-12-20 LAB — BASIC METABOLIC PANEL
Anion gap: 8 (ref 5–15)
BUN: 8 mg/dL (ref 6–20)
CO2: 21 mmol/L — ABNORMAL LOW (ref 22–32)
Calcium: 8.4 mg/dL — ABNORMAL LOW (ref 8.9–10.3)
Chloride: 107 mmol/L (ref 98–111)
Creatinine, Ser: 0.42 mg/dL — ABNORMAL LOW (ref 0.44–1.00)
GFR calc Af Amer: >60 mL/min (ref >60)
GFR calc non Af Amer: >60 mL/min (ref >60)
Glucose, Bld: 101 mg/dL — ABNORMAL HIGH (ref 70–99)
Potassium: 3.7 mmol/L (ref 3.5–5.1)
Sodium: 136 mmol/L (ref 135–145)

## 2018-12-20 LAB — CBC
HCT: 37.2 % (ref 36.0–46.0)
Hemoglobin: 12.6 g/dL (ref 12.0–15.0)
MCH: 29.8 pg (ref 26.0–34.0)
MCHC: 33.9 g/dL (ref 30.0–36.0)
MCV: 87.9 fL (ref 80.0–100.0)
Platelets: 199 10*3/uL (ref 150–400)
RBC: 4.23 MIL/uL (ref 3.87–5.11)
RDW: 12.9 % (ref 11.5–15.5)
WBC: 11.6 10*3/uL — ABNORMAL HIGH (ref 4.0–10.5)
nRBC: 0 % (ref 0.0–0.2)

## 2018-12-20 LAB — TROPONIN I (HIGH SENSITIVITY): Troponin I (High Sensitivity): 3 ng/L (ref <18)

## 2018-12-20 NOTE — Patient Instructions (Signed)
Third Trimester of Pregnancy The third trimester is from week 28 through week 40 (months 7 through 9). The third trimester is a time when the unborn baby (fetus) is growing rapidly. At the end of the ninth month, the fetus is about 20 inches in length and weighs 6-10 pounds. Body changes during your third trimester Your body will continue to go through many changes during pregnancy. The changes vary from woman to woman. During the third trimester:  Your weight will continue to increase. You can expect to gain 25-35 pounds (11-16 kg) by the end of the pregnancy.  You may begin to get stretch marks on your hips, abdomen, and breasts.  You may urinate more often because the fetus is moving lower into your pelvis and pressing on your bladder.  You may develop or continue to have heartburn. This is caused by increased hormones that slow down muscles in the digestive tract.  You may develop or continue to have constipation because increased hormones slow digestion and cause the muscles that push waste through your intestines to relax.  You may develop hemorrhoids. These are swollen veins (varicose veins) in the rectum that can itch or be painful.  You may develop swollen, bulging veins (varicose veins) in your legs.  You may have increased body aches in the pelvis, back, or thighs. This is due to weight gain and increased hormones that are relaxing your joints.  You may have changes in your hair. These can include thickening of your hair, rapid growth, and changes in texture. Some women also have hair loss during or after pregnancy, or hair that feels dry or thin. Your hair will most likely return to normal after your baby is born.  Your breasts will continue to grow and they will continue to become tender. A yellow fluid (colostrum) may leak from your breasts. This is the first milk you are producing for your baby.  Your belly button may stick out.  You may notice more swelling in your hands,  face, or ankles.  You may have increased tingling or numbness in your hands, arms, and legs. The skin on your belly may also feel numb.  You may feel short of breath because of your expanding uterus.  You may have more problems sleeping. This can be caused by the size of your belly, increased need to urinate, and an increase in your body's metabolism.  You may notice the fetus "dropping," or moving lower in your abdomen (lightening).  You may have increased vaginal discharge.  You may notice your joints feel loose and you may have pain around your pelvic bone. What to expect at prenatal visits You will have prenatal exams every 2 weeks until week 36. Then you will have weekly prenatal exams. During a routine prenatal visit:  You will be weighed to make sure you and the baby are growing normally.  Your blood pressure will be taken.  Your abdomen will be measured to track your baby's growth.  The fetal heartbeat will be listened to.  Any test results from the previous visit will be discussed.  You may have a cervical check near your due date to see if your cervix has softened or thinned (effaced).  You will be tested for Group B streptococcus. This happens between 35 and 37 weeks. Your health care provider may ask you:  What your birth plan is.  How you are feeling.  If you are feeling the baby move.  If you have had any abnormal   symptoms, such as leaking fluid, bleeding, severe headaches, or abdominal cramping.  If you are using any tobacco products, including cigarettes, chewing tobacco, and electronic cigarettes.  If you have any questions. Other tests or screenings that may be performed during your third trimester include:  Blood tests that check for low iron levels (anemia).  Fetal testing to check the health, activity level, and growth of the fetus. Testing is done if you have certain medical conditions or if there are problems during the pregnancy.  Nonstress test  (NST). This test checks the health of your baby to make sure there are no signs of problems, such as the baby not getting enough oxygen. During this test, a belt is placed around your belly. The baby is made to move, and its heart rate is monitored during movement. What is false labor? False labor is a condition in which you feel small, irregular tightenings of the muscles in the womb (contractions) that usually go away with rest, changing position, or drinking water. These are called Braxton Hicks contractions. Contractions may last for hours, days, or even weeks before true labor sets in. If contractions come at regular intervals, become more frequent, increase in intensity, or become painful, you should see your health care provider. What are the signs of labor?  Abdominal cramps.  Regular contractions that start at 10 minutes apart and become stronger and more frequent with time.  Contractions that start on the top of the uterus and spread down to the lower abdomen and back.  Increased pelvic pressure and dull back pain.  A watery or bloody mucus discharge that comes from the vagina.  Leaking of amniotic fluid. This is also known as your "water breaking." It could be a slow trickle or a gush. Let your health care provider know if it has a color or strange odor. If you have any of these signs, call your health care provider right away, even if it is before your due date. Follow these instructions at home: Medicines  Follow your health care provider's instructions regarding medicine use. Specific medicines may be either safe or unsafe to take during pregnancy.  Take a prenatal vitamin that contains at least 600 micrograms (mcg) of folic acid.  If you develop constipation, try taking a stool softener if your health care provider approves. Eating and drinking   Eat a balanced diet that includes fresh fruits and vegetables, whole grains, good sources of protein such as meat, eggs, or tofu,  and low-fat dairy. Your health care provider will help you determine the amount of weight gain that is right for you.  Avoid raw meat and uncooked cheese. These carry germs that can cause birth defects in the baby.  If you have low calcium intake from food, talk to your health care provider about whether you should take a daily calcium supplement.  Eat four or five small meals rather than three large meals a day.  Limit foods that are high in fat and processed sugars, such as fried and sweet foods.  To prevent constipation: ? Drink enough fluid to keep your urine clear or pale yellow. ? Eat foods that are high in fiber, such as fresh fruits and vegetables, whole grains, and beans. Activity  Exercise only as directed by your health care provider. Most women can continue their usual exercise routine during pregnancy. Try to exercise for 30 minutes at least 5 days a week. Stop exercising if you experience uterine contractions.  Avoid heavy lifting.  Do   not exercise in extreme heat or humidity, or at high altitudes.  Wear low-heel, comfortable shoes.  Practice good posture.  You may continue to have sex unless your health care provider tells you otherwise. Relieving pain and discomfort  Take frequent breaks and rest with your legs elevated if you have leg cramps or low back pain.  Take warm sitz baths to soothe any pain or discomfort caused by hemorrhoids. Use hemorrhoid cream if your health care provider approves.  Wear a good support bra to prevent discomfort from breast tenderness.  If you develop varicose veins: ? Wear support pantyhose or compression stockings as told by your healthcare provider. ? Elevate your feet for 15 minutes, 3-4 times a day. Prenatal care  Write down your questions. Take them to your prenatal visits.  Keep all your prenatal visits as told by your health care provider. This is important. Safety  Wear your seat belt at all times when driving.  Make  a list of emergency phone numbers, including numbers for family, friends, the hospital, and police and fire departments. General instructions  Avoid cat litter boxes and soil used by cats. These carry germs that can cause birth defects in the baby. If you have a cat, ask someone to clean the litter box for you.  Do not travel far distances unless it is absolutely necessary and only with the approval of your health care provider.  Do not use hot tubs, steam rooms, or saunas.  Do not drink alcohol.  Do not use any products that contain nicotine or tobacco, such as cigarettes and e-cigarettes. If you need help quitting, ask your health care provider.  Do not use any medicinal herbs or unprescribed drugs. These chemicals affect the formation and growth of the baby.  Do not douche or use tampons or scented sanitary pads.  Do not cross your legs for long periods of time.  To prepare for the arrival of your baby: ? Take prenatal classes to understand, practice, and ask questions about labor and delivery. ? Make a trial run to the hospital. ? Visit the hospital and tour the maternity area. ? Arrange for maternity or paternity leave through employers. ? Arrange for family and friends to take care of pets while you are in the hospital. ? Purchase a rear-facing car seat and make sure you know how to install it in your car. ? Pack your hospital bag. ? Prepare the baby's nursery. Make sure to remove all pillows and stuffed animals from the baby's crib to prevent suffocation.  Visit your dentist if you have not gone during your pregnancy. Use a soft toothbrush to brush your teeth and be gentle when you floss. Contact a health care provider if:  You are unsure if you are in labor or if your water has broken.  You become dizzy.  You have mild pelvic cramps, pelvic pressure, or nagging pain in your abdominal area.  You have lower back pain.  You have persistent nausea, vomiting, or diarrhea.   You have an unusual or bad smelling vaginal discharge.  You have pain when you urinate. Get help right away if:  Your water breaks before 37 weeks.  You have regular contractions less than 5 minutes apart before 37 weeks.  You have a fever.  You are leaking fluid from your vagina.  You have spotting or bleeding from your vagina.  You have severe abdominal pain or cramping.  You have rapid weight loss or weight gain.  You have   shortness of breath with chest pain.  You notice sudden or extreme swelling of your face, hands, ankles, feet, or legs.  Your baby makes fewer than 10 movements in 2 hours.  You have severe headaches that do not go away when you take medicine.  You have vision changes. Summary  The third trimester is from week 28 through week 40, months 7 through 9. The third trimester is a time when the unborn baby (fetus) is growing rapidly.  During the third trimester, your discomfort may increase as you and your baby continue to gain weight. You may have abdominal, leg, and back pain, sleeping problems, and an increased need to urinate.  During the third trimester your breasts will keep growing and they will continue to become tender. A yellow fluid (colostrum) may leak from your breasts. This is the first milk you are producing for your baby.  False labor is a condition in which you feel small, irregular tightenings of the muscles in the womb (contractions) that eventually go away. These are called Braxton Hicks contractions. Contractions may last for hours, days, or even weeks before true labor sets in.  Signs of labor can include: abdominal cramps; regular contractions that start at 10 minutes apart and become stronger and more frequent with time; watery or bloody mucus discharge that comes from the vagina; increased pelvic pressure and dull back pain; and leaking of amniotic fluid. This information is not intended to replace advice given to you by your health  care provider. Make sure you discuss any questions you have with your health care provider. Document Released: 05/04/2001 Document Revised: 08/31/2018 Document Reviewed: 06/15/2016 Elsevier Patient Education  2020 [employer]  

## 2018-12-20 NOTE — ED Triage Notes (Signed)
PT arrives with complaints of mid sternum chest pain that started yesterday. Pt reports the pain feels like a discomfort until she takes a deep breath then the pain feels sharp. Pt reports she is [redacted] weeks pregnant.

## 2018-12-20 NOTE — Progress Notes (Signed)
  Routine Prenatal Care Visit  Subjective  Debra Hinton is a 23 y.o. G2P1001 at [redacted]w[redacted]d being seen today for ongoing prenatal care.  She is currently monitored for the following issues for this high-risk pregnancy and has Irritable bowel syndrome with constipation; Generalized anxiety disorder; Vitamin D deficiency; Hyperthyroidism; GERD (gastroesophageal reflux disease); Supervision of high risk pregnancy, antepartum; Maternal obesity, antepartum; Graves disease; and Pregnancy related nausea and vomiting, antepartum on their problem list.  ----------------------------------------------------------------------------------- Patient reports no complaints.   Contractions: Not present. Vag. Bleeding: None.  Movement: Present. Denies leaking of fluid.  ----------------------------------------------------------------------------------- The following portions of the patient's history were reviewed and updated as appropriate: allergies, current medications, past family history, past medical history, past social history, past surgical history and problem list. Problem list updated.   Objective  Blood pressure 122/78, weight 182 lb (82.6 kg), last menstrual period 05/31/2018. Pregravid weight 185 lb (83.9 kg) Total Weight Gain -3 lb (-1.361 kg) Urinalysis: Urine Protein    Urine Glucose    Fetal Status: Fetal Heart Rate (bpm): 140 Fundal Height: 28 cm Movement: Present     General:  Alert, oriented and cooperative. Patient is in no acute distress.  Skin: Skin is warm and dry. No rash noted.   Cardiovascular: Normal heart rate noted  Respiratory: Normal respiratory effort, no problems with respiration noted  Abdomen: Soft, gravid, appropriate for gestational age. Pain/Pressure: Absent     Pelvic:  Cervical exam deferred        Extremities: Normal range of motion.  Edema: None  Mental Status: Normal mood and affect. Normal behavior. Normal judgment and thought content.   Assessment   23 y.o.  G2P1001 at [redacted]w[redacted]d by  03/14/2019, by Ultrasound presenting for routine prenatal visit  Plan   Pregnancy#2 Problems (from 05/12/18 to present)    Problem Noted Resolved   Supervision of high risk pregnancy, antepartum 07/20/2018 by Malachy Mood, MD No   Overview Addendum 11/01/2018  1:39 PM by Rexene Agent, [place] Prenatal Labs  Dating 7wk1d [place] Blood type: O/Positive/-- (02/27 1127)   Genetic Screen NIPS: Negative XX SMA: Negative  FragileX: Negative CF: Negative Antibody:Comment, See Final Results (02/27 1127)  Anatomic [place] Complete 11/01/2018 Rubella: 7.32 (02/27 1127) Varicella: Immune  GTT  RPR: Non Reactive (02/27 1127)   Rhogam N/A HBsAg: Negative (02/27 1127)   TDaP vaccine                       Flu Shot: HIV: Non Reactive (02/27 1127)   Baby Food                                GBS:   Contraception  GBS prior pregnancy: no  Pelvis Tested   Pap: 05/31/2018 NIL  CS/VBAC N/A   Support Person Larkin Ina Fraiser              Preterm labor symptoms and general obstetric precautions including but not limited to vaginal bleeding, contractions, leaking of fluid and fetal movement were reviewed in detail with the patient. Please refer to After Visit Summary for other counseling recommendations.   Return in about 2 weeks (around 01/03/2019) for rob.  Rod Can, CNM 12/20/2018 10:56 AM

## 2018-12-20 NOTE — Progress Notes (Signed)
28 week labs today, need order put in. No vb. No lof.

## 2018-12-21 ENCOUNTER — Telehealth: Payer: Self-pay

## 2018-12-21 ENCOUNTER — Encounter: Payer: Self-pay | Admitting: Advanced Practice Midwife

## 2018-12-21 ENCOUNTER — Emergency Department
Admission: EM | Admit: 2018-12-21 | Discharge: 2018-12-21 | Discharge status: Against Medical Advice | Payer: Commercial Managed Care - PPO | Attending: Emergency Medicine | Admitting: Emergency Medicine

## 2018-12-21 LAB — 28 WEEK RH+PANEL
Basophils Absolute: 0 10*3/uL (ref 0.0–0.2)
Basos: 0 %
EOS (ABSOLUTE): 0.2 10*3/uL (ref 0.0–0.4)
Eos: 2 %
Gestational Diabetes Screen: 96 mg/dL (ref 65–139)
HIV Screen 4th Generation wRfx: NONREACTIVE
Hematocrit: 38.1 % (ref 34.0–46.6)
Hemoglobin: 12.9 g/dL (ref 11.1–15.9)
Immature Grans (Abs): 0 10*3/uL (ref 0.0–0.1)
Immature Granulocytes: 0 %
Lymphocytes Absolute: 1.7 10*3/uL (ref 0.7–3.1)
Lymphs: 19 %
MCH: 30.1 pg (ref 26.6–33.0)
MCHC: 33.9 g/dL (ref 31.5–35.7)
MCV: 89 fL (ref 79–97)
Monocytes Absolute: 0.4 10*3/uL (ref 0.1–0.9)
Monocytes: 5 %
Neutrophils Absolute: 6.7 10*3/uL (ref 1.4–7.0)
Neutrophils: 74 %
Platelets: 194 10*3/uL (ref 150–450)
RBC: 4.29 x10E6/uL (ref 3.77–5.28)
RDW: 12.8 % (ref 11.7–15.4)
RPR Ser Ql: NONREACTIVE
WBC: 9 10*3/uL (ref 3.4–10.8)

## 2018-12-21 NOTE — Telephone Encounter (Signed)
PT calling triage stating she has had a headache x 2 days and dull pain in chest. She went to the ER last night and they said everything was fine, EKG was normal. Blood pressure was normal.   I called to get more information and spoke to patient, she states she has N&V every day and chest pain feels like a pulled muscle.  advised her it could be hurting d/t vomiting. Also, advised headaches could be from maybe being dehydrated. She states an understanding of the importance of staying hydrated, getting plenty of sleep and taking Tylenol for headaches. Pt states she didn't get home until 3:00am this morning therefore did not go to work.  She needs a note for being out today. She has MyChart and can print it from there.

## 2018-12-21 NOTE — ED Notes (Signed)
No answer when called from lobby x1 

## 2018-12-21 NOTE — ED Notes (Signed)
No answer when called several times from lobby 

## 2019-01-03 ENCOUNTER — Encounter: Payer: Self-pay | Admitting: Obstetrics and Gynecology

## 2019-01-03 ENCOUNTER — Ambulatory Visit (INDEPENDENT_AMBULATORY_CARE_PROVIDER_SITE_OTHER): Payer: Commercial Managed Care - PPO | Admitting: Obstetrics and Gynecology

## 2019-01-03 ENCOUNTER — Other Ambulatory Visit: Payer: Self-pay

## 2019-01-03 VITALS — BP 120/74 | Wt 181.0 lb

## 2019-01-03 DIAGNOSIS — O99213 Obesity complicating pregnancy, third trimester: Secondary | ICD-10-CM

## 2019-01-03 DIAGNOSIS — O99210 Obesity complicating pregnancy, unspecified trimester: Secondary | ICD-10-CM

## 2019-01-03 DIAGNOSIS — O0990 Supervision of high risk pregnancy, unspecified, unspecified trimester: Secondary | ICD-10-CM

## 2019-01-03 DIAGNOSIS — Z3A30 30 weeks gestation of pregnancy: Secondary | ICD-10-CM

## 2019-01-03 DIAGNOSIS — O0993 Supervision of high risk pregnancy, unspecified, third trimester: Secondary | ICD-10-CM

## 2019-01-03 NOTE — Progress Notes (Signed)
    Routine Prenatal Care Visit  Subjective  Debra Hinton is a 23 y.o. G2P1001 at [redacted]w[redacted]d being seen today for ongoing prenatal care.  She is currently monitored for the following issues for this low-risk pregnancy and has Irritable bowel syndrome with constipation; Generalized anxiety disorder; Vitamin D deficiency; Hyperthyroidism; GERD (gastroesophageal reflux disease); Supervision of high risk pregnancy, antepartum; Maternal obesity, antepartum; Graves disease; and Pregnancy related nausea and vomiting, antepartum on their problem list.  ----------------------------------------------------------------------------------- Patient reports no complaints.   Contractions: Not present. Vag. Bleeding: None.  Movement: Present. Denies leaking of fluid.  ----------------------------------------------------------------------------------- The following portions of the patient's history were reviewed and updated as appropriate: allergies, current medications, past family history, past medical history, past social history, past surgical history and problem list. Problem list updated.   Objective  Blood pressure 120/74, weight 181 lb (82.1 kg), last menstrual period 05/31/2018. Pregravid weight 185 lb (83.9 kg) Total Weight Gain -4 lb (-1.814 kg) Urinalysis:      Fetal Status: Fetal Heart Rate (bpm): 150 Fundal Height: 30 cm Movement: Present     General:  Alert, oriented and cooperative. Patient is in no acute distress.  Skin: Skin is warm and dry. No rash noted.   Cardiovascular: Normal heart rate noted  Respiratory: Normal respiratory effort, no problems with respiration noted  Abdomen: Soft, gravid, appropriate for gestational age. Pain/Pressure: Absent     Pelvic:  Cervical exam deferred        Extremities: Normal range of motion.  Edema: None  Mental Status: Normal mood and affect. Normal behavior. Normal judgment and thought content.     Assessment   23 y.o. G2P1001 at [redacted]w[redacted]d by   03/14/2019, by Ultrasound presenting for routine prenatal visit  Plan   Pregnancy#2 Problems (from 05/12/18 to present)    Problem Noted Resolved   Supervision of high risk pregnancy, antepartum 07/20/2018 by Malachy Mood, MD No   Overview Addendum 01/03/2019 10:53 AM by Homero Fellers, [place]  Dating 7wk1d [place] Blood type: O/Positive/-- (02/27 1127)   Genetic Screen NIPS: Negative XX SMA: Negative  FragileX: Negative CF: Negative Antibody:Comment, See Final Results (02/27 1127)  Anatomic [place] Complete 11/01/2018 Rubella: 7.32 (02/27 1127) Varicella: Immune  GTT  96 RPR: Non Reactive (02/27 1127)   Rhogam N/A HBsAg: Negative (02/27 1127)   TDaP vaccine                       Flu Shot: HIV: Non Reactive (02/27 1127)   Baby Food   Breast                             GBS:   Contraception  GBS prior pregnancy: no  Pelvis Tested   Pap: 05/31/2018 NIL  CS/VBAC N/A   Support Person Lesia Hausen              Gestational age appropriate obstetric precautions including but not limited to vaginal bleeding, contractions, leaking of fluid and fetal movement were reviewed in detail with the patient.    Declined Tdap today- will accept at next visit Discussed breast feeding. Patient feels prepared with experience from last pregnancy and community support.   Return in about 2 weeks (around 01/17/2019) for ROB in person.  Homero Fellers MD [facility], [facility] 01/03/2019, 10:53 AM

## 2019-01-03 NOTE — Progress Notes (Signed)
No vb. No lof. TDAP at nv per pt.

## 2019-01-19 ENCOUNTER — Other Ambulatory Visit: Payer: Self-pay

## 2019-01-19 ENCOUNTER — Ambulatory Visit (INDEPENDENT_AMBULATORY_CARE_PROVIDER_SITE_OTHER): Payer: Commercial Managed Care - PPO | Admitting: Maternal Newborn

## 2019-01-19 ENCOUNTER — Encounter: Payer: Commercial Managed Care - PPO | Admitting: Certified Nurse Midwife

## 2019-01-19 ENCOUNTER — Encounter: Payer: Self-pay | Admitting: Maternal Newborn

## 2019-01-19 VITALS — BP 120/70 | Wt 183.0 lb

## 2019-01-19 DIAGNOSIS — Z23 Encounter for immunization: Secondary | ICD-10-CM

## 2019-01-19 DIAGNOSIS — O0990 Supervision of high risk pregnancy, unspecified, unspecified trimester: Secondary | ICD-10-CM

## 2019-01-19 DIAGNOSIS — O0993 Supervision of high risk pregnancy, unspecified, third trimester: Secondary | ICD-10-CM

## 2019-01-19 DIAGNOSIS — Z3A32 32 weeks gestation of pregnancy: Secondary | ICD-10-CM

## 2019-01-19 NOTE — Progress Notes (Signed)
ROB/TDap and BT consent- no concerns, denied flu shot

## 2019-01-19 NOTE — Progress Notes (Signed)
    Routine Prenatal Care Visit  Subjective  Debra Hinton is a 23 y.o. G2P1001 at [redacted]w[redacted]d being seen today for ongoing prenatal care.  She is currently monitored for the following issues for this high-risk pregnancy and has Irritable bowel syndrome with constipation; Generalized anxiety disorder; Vitamin D deficiency; Hyperthyroidism; GERD (gastroesophageal reflux disease); Supervision of high risk pregnancy, antepartum; Maternal obesity, antepartum; Graves disease; and Pregnancy related nausea and vomiting, antepartum on their problem list.  ----------------------------------------------------------------------------------- Patient reports no complaints.   Contractions: Not present. Vag. Bleeding: None.  Movement: Present. No leaking of fluid.  ----------------------------------------------------------------------------------- The following portions of the patient's history were reviewed and updated as appropriate: allergies, current medications, past family history, past medical history, past social history, past surgical history and problem list. Problem list updated.   Objective  Blood pressure 120/70, weight 183 lb (83 kg), last menstrual period 05/31/2018. Pregravid weight 185 lb (83.9 kg) Total Weight Gain -2 lb (-0.907 kg)  Fetal Status: Fetal Heart Rate (bpm): 143 Fundal Height: 31 cm Movement: Present     General:  Alert, oriented and cooperative. Patient is in no acute distress.  Skin: Skin is warm and dry. No rash noted.   Cardiovascular: Normal heart rate noted  Respiratory: Normal respiratory effort, no problems with respiration noted  Abdomen: Soft, gravid, appropriate for gestational age. Pain/Pressure: Absent     Pelvic:  Cervical exam deferred        Extremities: Normal range of motion.  Edema: None  Mental Status: Normal mood and affect. Normal behavior. Normal judgment and thought content.     Assessment   23 y.o. G2P1001 at [redacted]w[redacted]d, EDD 03/14/2019 by Ultrasound  presenting for a routine prenatal visit.  Plan   Pregnancy#2 Problems (from 05/12/18 to present)    Problem Noted Resolved   Supervision of high risk pregnancy, antepartum 07/20/2018 by Malachy Mood, MD No   Overview Addendum 01/03/2019 10:54 AM by Homero Fellers, [place]  Dating 7wk1d [place] Blood type: O/Positive/-- (02/27 1127)   Genetic Screen NIPS: Negative XX SMA: Negative  FragileX: Negative CF: Negative Antibody:Comment, See Final Results (02/27 1127)  Anatomic [place] Complete 11/01/2018 Rubella: 7.32 (02/27 1127) Varicella: Immune  GTT 96 RPR: Non Reactive (02/27 1127)   Rhogam N/A HBsAg: Negative (02/27 1127)   TDaP vaccine                       Flu Shot: HIV: Non Reactive (02/27 1127)   Baby Food   Breast                             GBS:   Contraception  GBS prior pregnancy: no  Pelvis Tested   Pap: 05/31/2018 NIL  CS/VBAC N/A   Support Person [employer]	[place]           TDaP vaccine accepted today. Declines flu vaccine.   Discussed birth plans, current unavailability of nitrous oxide during labor and delivery due to COVID-19 precautions.  Please refer to After Visit Summary for other counseling recommendations.   Return in about 2 weeks (around 02/02/2019) for ROB.  Avel Sensor, CNM 01/19/2019  12:38 PM

## 2019-01-19 NOTE — Patient Instructions (Signed)
Third Trimester of Pregnancy The third trimester is from week 28 through week 40 (months 7 through 9). The third trimester is a time when the unborn baby (fetus) is growing rapidly. At the end of the ninth month, the fetus is about 20 inches in length and weighs 6-10 pounds. Body changes during your third trimester Your body will continue to go through many changes during pregnancy. The changes vary from woman to woman. During the third trimester:  Your weight will continue to increase. You can expect to gain 25-35 pounds (11-16 kg) by the end of the pregnancy.  You may begin to get stretch marks on your hips, abdomen, and breasts.  You may urinate more often because the fetus is moving lower into your pelvis and pressing on your bladder.  You may develop or continue to have heartburn. This is caused by increased hormones that slow down muscles in the digestive tract.  You may develop or continue to have constipation because increased hormones slow digestion and cause the muscles that push waste through your intestines to relax.  You may develop hemorrhoids. These are swollen veins (varicose veins) in the rectum that can itch or be painful.  You may develop swollen, bulging veins (varicose veins) in your legs.  You may have increased body aches in the pelvis, back, or thighs. This is due to weight gain and increased hormones that are relaxing your joints.  You may have changes in your hair. These can include thickening of your hair, rapid growth, and changes in texture. Some women also have hair loss during or after pregnancy, or hair that feels dry or thin. Your hair will most likely return to normal after your baby is born.  Your breasts will continue to grow and they will continue to become tender. A yellow fluid (colostrum) may leak from your breasts. This is the first milk you are producing for your baby.  Your belly button may stick out.  You may notice more swelling in your hands,  face, or ankles.  You may have increased tingling or numbness in your hands, arms, and legs. The skin on your belly may also feel numb.  You may feel short of breath because of your expanding uterus.  You may have more problems sleeping. This can be caused by the size of your belly, increased need to urinate, and an increase in your body's metabolism.  You may notice the fetus "dropping," or moving lower in your abdomen (lightening).  You may have increased vaginal discharge.  You may notice your joints feel loose and you may have pain around your pelvic bone. What to expect at prenatal visits You will have prenatal exams every 2 weeks until week 36. Then you will have weekly prenatal exams. During a routine prenatal visit:  You will be weighed to make sure you and the baby are growing normally.  Your blood pressure will be taken.  Your abdomen will be measured to track your baby's growth.  The fetal heartbeat will be listened to.  Any test results from the previous visit will be discussed.  You may have a cervical check near your due date to see if your cervix has softened or thinned (effaced).  You will be tested for Group B streptococcus. This happens between 35 and 37 weeks. Your health care provider may ask you:  What your birth plan is.  How you are feeling.  If you are feeling the baby move.  If you have had any abnormal   symptoms, such as leaking fluid, bleeding, severe headaches, or abdominal cramping.  If you are using any tobacco products, including cigarettes, chewing tobacco, and electronic cigarettes.  If you have any questions. Other tests or screenings that may be performed during your third trimester include:  Blood tests that check for low iron levels (anemia).  Fetal testing to check the health, activity level, and growth of the fetus. Testing is done if you have certain medical conditions or if there are problems during the pregnancy.  Nonstress test  (NST). This test checks the health of your baby to make sure there are no signs of problems, such as the baby not getting enough oxygen. During this test, a belt is placed around your belly. The baby is made to move, and its heart rate is monitored during movement. What is false labor? False labor is a condition in which you feel small, irregular tightenings of the muscles in the womb (contractions) that usually go away with rest, changing position, or drinking water. These are called Braxton Hicks contractions. Contractions may last for hours, days, or even weeks before true labor sets in. If contractions come at regular intervals, become more frequent, increase in intensity, or become painful, you should see your health care provider. What are the signs of labor?  Abdominal cramps.  Regular contractions that start at 10 minutes apart and become stronger and more frequent with time.  Contractions that start on the top of the uterus and spread down to the lower abdomen and back.  Increased pelvic pressure and dull back pain.  A watery or bloody mucus discharge that comes from the vagina.  Leaking of amniotic fluid. This is also known as your "water breaking." It could be a slow trickle or a gush. Let your health care provider know if it has a color or strange odor. If you have any of these signs, call your health care provider right away, even if it is before your due date. Follow these instructions at home: Medicines  Follow your health care provider's instructions regarding medicine use. Specific medicines may be either safe or unsafe to take during pregnancy.  Take a prenatal vitamin that contains at least 600 micrograms (mcg) of folic acid.  If you develop constipation, try taking a stool softener if your health care provider approves. Eating and drinking   Eat a balanced diet that includes fresh fruits and vegetables, whole grains, good sources of protein such as meat, eggs, or tofu,  and low-fat dairy. Your health care provider will help you determine the amount of weight gain that is right for you.  Avoid raw meat and uncooked cheese. These carry germs that can cause birth defects in the baby.  If you have low calcium intake from food, talk to your health care provider about whether you should take a daily calcium supplement.  Eat four or five small meals rather than three large meals a day.  Limit foods that are high in fat and processed sugars, such as fried and sweet foods.  To prevent constipation: ? Drink enough fluid to keep your urine clear or pale yellow. ? Eat foods that are high in fiber, such as fresh fruits and vegetables, whole grains, and beans. Activity  Exercise only as directed by your health care provider. Most women can continue their usual exercise routine during pregnancy. Try to exercise for 30 minutes at least 5 days a week. Stop exercising if you experience uterine contractions.  Avoid heavy lifting.  Do   not exercise in extreme heat or humidity, or at high altitudes.  Wear low-heel, comfortable shoes.  Practice good posture.  You may continue to have sex unless your health care provider tells you otherwise. Relieving pain and discomfort  Take frequent breaks and rest with your legs elevated if you have leg cramps or low back pain.  Take warm sitz baths to soothe any pain or discomfort caused by hemorrhoids. Use hemorrhoid cream if your health care provider approves.  Wear a good support bra to prevent discomfort from breast tenderness.  If you develop varicose veins: ? Wear support pantyhose or compression stockings as told by your healthcare provider. ? Elevate your feet for 15 minutes, 3-4 times a day. Prenatal care  Write down your questions. Take them to your prenatal visits.  Keep all your prenatal visits as told by your health care provider. This is important. Safety  Wear your seat belt at all times when driving.  Make  a list of emergency phone numbers, including numbers for family, friends, the hospital, and police and fire departments. General instructions  Avoid cat litter boxes and soil used by cats. These carry germs that can cause birth defects in the baby. If you have a cat, ask someone to clean the litter box for you.  Do not travel far distances unless it is absolutely necessary and only with the approval of your health care provider.  Do not use hot tubs, steam rooms, or saunas.  Do not drink alcohol.  Do not use any products that contain nicotine or tobacco, such as cigarettes and e-cigarettes. If you need help quitting, ask your health care provider.  Do not use any medicinal herbs or unprescribed drugs. These chemicals affect the formation and growth of the baby.  Do not douche or use tampons or scented sanitary pads.  Do not cross your legs for long periods of time.  To prepare for the arrival of your baby: ? Take prenatal classes to understand, practice, and ask questions about labor and delivery. ? Make a trial run to the hospital. ? Visit the hospital and tour the maternity area. ? Arrange for maternity or paternity leave through employers. ? Arrange for family and friends to take care of pets while you are in the hospital. ? Purchase a rear-facing car seat and make sure you know how to install it in your car. ? Pack your hospital bag. ? Prepare the baby's nursery. Make sure to remove all pillows and stuffed animals from the baby's crib to prevent suffocation.  Visit your dentist if you have not gone during your pregnancy. Use a soft toothbrush to brush your teeth and be gentle when you floss. Contact a health care provider if:  You are unsure if you are in labor or if your water has broken.  You become dizzy.  You have mild pelvic cramps, pelvic pressure, or nagging pain in your abdominal area.  You have lower back pain.  You have persistent nausea, vomiting, or diarrhea.   You have an unusual or bad smelling vaginal discharge.  You have pain when you urinate. Get help right away if:  Your water breaks before 37 weeks.  You have regular contractions less than 5 minutes apart before 37 weeks.  You have a fever.  You are leaking fluid from your vagina.  You have spotting or bleeding from your vagina.  You have severe abdominal pain or cramping.  You have rapid weight loss or weight gain.  You have   shortness of breath with chest pain.  You notice sudden or extreme swelling of your face, hands, ankles, feet, or legs.  Your baby makes fewer than 10 movements in 2 hours.  You have severe headaches that do not go away when you take medicine.  You have vision changes. Summary  The third trimester is from week 28 through week 40, months 7 through 9. The third trimester is a time when the unborn baby (fetus) is growing rapidly.  During the third trimester, your discomfort may increase as you and your baby continue to gain weight. You may have abdominal, leg, and back pain, sleeping problems, and an increased need to urinate.  During the third trimester your breasts will keep growing and they will continue to become tender. A yellow fluid (colostrum) may leak from your breasts. This is the first milk you are producing for your baby.  False labor is a condition in which you feel small, irregular tightenings of the muscles in the womb (contractions) that eventually go away. These are called Braxton Hicks contractions. Contractions may last for hours, days, or even weeks before true labor sets in.  Signs of labor can include: abdominal cramps; regular contractions that start at 10 minutes apart and become stronger and more frequent with time; watery or bloody mucus discharge that comes from the vagina; increased pelvic pressure and dull back pain; and leaking of amniotic fluid. This information is not intended to replace advice given to you by your health  care provider. Make sure you discuss any questions you have with your health care provider. Document Released: 05/04/2001 Document Revised: 08/31/2018 Document Reviewed: 06/15/2016 Elsevier Patient Education  2020 [employer]  

## 2019-01-24 ENCOUNTER — Ambulatory Visit (INDEPENDENT_AMBULATORY_CARE_PROVIDER_SITE_OTHER): Payer: Commercial Managed Care - PPO | Admitting: Maternal Newborn

## 2019-01-24 ENCOUNTER — Other Ambulatory Visit: Payer: Self-pay

## 2019-01-24 ENCOUNTER — Encounter: Payer: Self-pay | Admitting: Maternal Newborn

## 2019-01-24 VITALS — BP 120/70 | Wt 182.4 lb

## 2019-01-24 DIAGNOSIS — Z3A33 33 weeks gestation of pregnancy: Secondary | ICD-10-CM

## 2019-01-24 DIAGNOSIS — O0993 Supervision of high risk pregnancy, unspecified, third trimester: Secondary | ICD-10-CM

## 2019-01-24 DIAGNOSIS — O0990 Supervision of high risk pregnancy, unspecified, unspecified trimester: Secondary | ICD-10-CM

## 2019-01-24 LAB — POCT URINALYSIS DIPSTICK OB
Glucose, UA: NEGATIVE
POC,PROTEIN,UA: NEGATIVE

## 2019-01-24 NOTE — Patient Instructions (Signed)
Third Trimester of Pregnancy The third trimester is from week 28 through week 40 (months 7 through 9). The third trimester is a time when the unborn baby (fetus) is growing rapidly. At the end of the ninth month, the fetus is about 20 inches in length and weighs 6-10 pounds. Body changes during your third trimester Your body will continue to go through many changes during pregnancy. The changes vary from woman to woman. During the third trimester:  Your weight will continue to increase. You can expect to gain 25-35 pounds (11-16 kg) by the end of the pregnancy.  You may begin to get stretch marks on your hips, abdomen, and breasts.  You may urinate more often because the fetus is moving lower into your pelvis and pressing on your bladder.  You may develop or continue to have heartburn. This is caused by increased hormones that slow down muscles in the digestive tract.  You may develop or continue to have constipation because increased hormones slow digestion and cause the muscles that push waste through your intestines to relax.  You may develop hemorrhoids. These are swollen veins (varicose veins) in the rectum that can itch or be painful.  You may develop swollen, bulging veins (varicose veins) in your legs.  You may have increased body aches in the pelvis, back, or thighs. This is due to weight gain and increased hormones that are relaxing your joints.  You may have changes in your hair. These can include thickening of your hair, rapid growth, and changes in texture. Some women also have hair loss during or after pregnancy, or hair that feels dry or thin. Your hair will most likely return to normal after your baby is born.  Your breasts will continue to grow and they will continue to become tender. A yellow fluid (colostrum) may leak from your breasts. This is the first milk you are producing for your baby.  Your belly button may stick out.  You may notice more swelling in your hands,  face, or ankles.  You may have increased tingling or numbness in your hands, arms, and legs. The skin on your belly may also feel numb.  You may feel short of breath because of your expanding uterus.  You may have more problems sleeping. This can be caused by the size of your belly, increased need to urinate, and an increase in your body's metabolism.  You may notice the fetus "dropping," or moving lower in your abdomen (lightening).  You may have increased vaginal discharge.  You may notice your joints feel loose and you may have pain around your pelvic bone. What to expect at prenatal visits You will have prenatal exams every 2 weeks until week 36. Then you will have weekly prenatal exams. During a routine prenatal visit:  You will be weighed to make sure you and the baby are growing normally.  Your blood pressure will be taken.  Your abdomen will be measured to track your baby's growth.  The fetal heartbeat will be listened to.  Any test results from the previous visit will be discussed.  You may have a cervical check near your due date to see if your cervix has softened or thinned (effaced).  You will be tested for Group B streptococcus. This happens between 35 and 37 weeks. Your health care provider may ask you:  What your birth plan is.  How you are feeling.  If you are feeling the baby move.  If you have had any abnormal   symptoms, such as leaking fluid, bleeding, severe headaches, or abdominal cramping.  If you are using any tobacco products, including cigarettes, chewing tobacco, and electronic cigarettes.  If you have any questions. Other tests or screenings that may be performed during your third trimester include:  Blood tests that check for low iron levels (anemia).  Fetal testing to check the health, activity level, and growth of the fetus. Testing is done if you have certain medical conditions or if there are problems during the pregnancy.  Nonstress test  (NST). This test checks the health of your baby to make sure there are no signs of problems, such as the baby not getting enough oxygen. During this test, a belt is placed around your belly. The baby is made to move, and its heart rate is monitored during movement. What is false labor? False labor is a condition in which you feel small, irregular tightenings of the muscles in the womb (contractions) that usually go away with rest, changing position, or drinking water. These are called Braxton Hicks contractions. Contractions may last for hours, days, or even weeks before true labor sets in. If contractions come at regular intervals, become more frequent, increase in intensity, or become painful, you should see your health care provider. What are the signs of labor?  Abdominal cramps.  Regular contractions that start at 10 minutes apart and become stronger and more frequent with time.  Contractions that start on the top of the uterus and spread down to the lower abdomen and back.  Increased pelvic pressure and dull back pain.  A watery or bloody mucus discharge that comes from the vagina.  Leaking of amniotic fluid. This is also known as your "water breaking." It could be a slow trickle or a gush. Let your health care provider know if it has a color or strange odor. If you have any of these signs, call your health care provider right away, even if it is before your due date. Follow these instructions at home: Medicines  Follow your health care provider's instructions regarding medicine use. Specific medicines may be either safe or unsafe to take during pregnancy.  Take a prenatal vitamin that contains at least 600 micrograms (mcg) of folic acid.  If you develop constipation, try taking a stool softener if your health care provider approves. Eating and drinking   Eat a balanced diet that includes fresh fruits and vegetables, whole grains, good sources of protein such as meat, eggs, or tofu,  and low-fat dairy. Your health care provider will help you determine the amount of weight gain that is right for you.  Avoid raw meat and uncooked cheese. These carry germs that can cause birth defects in the baby.  If you have low calcium intake from food, talk to your health care provider about whether you should take a daily calcium supplement.  Eat four or five small meals rather than three large meals a day.  Limit foods that are high in fat and processed sugars, such as fried and sweet foods.  To prevent constipation: ? Drink enough fluid to keep your urine clear or pale yellow. ? Eat foods that are high in fiber, such as fresh fruits and vegetables, whole grains, and beans. Activity  Exercise only as directed by your health care provider. Most women can continue their usual exercise routine during pregnancy. Try to exercise for 30 minutes at least 5 days a week. Stop exercising if you experience uterine contractions.  Avoid heavy lifting.  Do   not exercise in extreme heat or humidity, or at high altitudes.  Wear low-heel, comfortable shoes.  Practice good posture.  You may continue to have sex unless your health care provider tells you otherwise. Relieving pain and discomfort  Take frequent breaks and rest with your legs elevated if you have leg cramps or low back pain.  Take warm sitz baths to soothe any pain or discomfort caused by hemorrhoids. Use hemorrhoid cream if your health care provider approves.  Wear a good support bra to prevent discomfort from breast tenderness.  If you develop varicose veins: ? Wear support pantyhose or compression stockings as told by your healthcare provider. ? Elevate your feet for 15 minutes, 3-4 times a day. Prenatal care  Write down your questions. Take them to your prenatal visits.  Keep all your prenatal visits as told by your health care provider. This is important. Safety  Wear your seat belt at all times when driving.  Make  a list of emergency phone numbers, including numbers for family, friends, the hospital, and police and fire departments. General instructions  Avoid cat litter boxes and soil used by cats. These carry germs that can cause birth defects in the baby. If you have a cat, ask someone to clean the litter box for you.  Do not travel far distances unless it is absolutely necessary and only with the approval of your health care provider.  Do not use hot tubs, steam rooms, or saunas.  Do not drink alcohol.  Do not use any products that contain nicotine or tobacco, such as cigarettes and e-cigarettes. If you need help quitting, ask your health care provider.  Do not use any medicinal herbs or unprescribed drugs. These chemicals affect the formation and growth of the baby.  Do not douche or use tampons or scented sanitary pads.  Do not cross your legs for long periods of time.  To prepare for the arrival of your baby: ? Take prenatal classes to understand, practice, and ask questions about labor and delivery. ? Make a trial run to the hospital. ? Visit the hospital and tour the maternity area. ? Arrange for maternity or paternity leave through employers. ? Arrange for family and friends to take care of pets while you are in the hospital. ? Purchase a rear-facing car seat and make sure you know how to install it in your car. ? Pack your hospital bag. ? Prepare the baby's nursery. Make sure to remove all pillows and stuffed animals from the baby's crib to prevent suffocation.  Visit your dentist if you have not gone during your pregnancy. Use a soft toothbrush to brush your teeth and be gentle when you floss. Contact a health care provider if:  You are unsure if you are in labor or if your water has broken.  You become dizzy.  You have mild pelvic cramps, pelvic pressure, or nagging pain in your abdominal area.  You have lower back pain.  You have persistent nausea, vomiting, or diarrhea.   You have an unusual or bad smelling vaginal discharge.  You have pain when you urinate. Get help right away if:  Your water breaks before 37 weeks.  You have regular contractions less than 5 minutes apart before 37 weeks.  You have a fever.  You are leaking fluid from your vagina.  You have spotting or bleeding from your vagina.  You have severe abdominal pain or cramping.  You have rapid weight loss or weight gain.  You have   shortness of breath with chest pain.  You notice sudden or extreme swelling of your face, hands, ankles, feet, or legs.  Your baby makes fewer than 10 movements in 2 hours.  You have severe headaches that do not go away when you take medicine.  You have vision changes. Summary  The third trimester is from week 28 through week 40, months 7 through 9. The third trimester is a time when the unborn baby (fetus) is growing rapidly.  During the third trimester, your discomfort may increase as you and your baby continue to gain weight. You may have abdominal, leg, and back pain, sleeping problems, and an increased need to urinate.  During the third trimester your breasts will keep growing and they will continue to become tender. A yellow fluid (colostrum) may leak from your breasts. This is the first milk you are producing for your baby.  False labor is a condition in which you feel small, irregular tightenings of the muscles in the womb (contractions) that eventually go away. These are called Braxton Hicks contractions. Contractions may last for hours, days, or even weeks before true labor sets in.  Signs of labor can include: abdominal cramps; regular contractions that start at 10 minutes apart and become stronger and more frequent with time; watery or bloody mucus discharge that comes from the vagina; increased pelvic pressure and dull back pain; and leaking of amniotic fluid. This information is not intended to replace advice given to you by your health  care provider. Make sure you discuss any questions you have with your health care provider. Document Released: 05/04/2001 Document Revised: 08/31/2018 Document Reviewed: 06/15/2016 Elsevier Patient Education  2020 [employer]  

## 2019-01-24 NOTE — Progress Notes (Signed)
No problems.rj 

## 2019-01-24 NOTE — Progress Notes (Signed)
    Routine Prenatal Care Visit  Subjective  Debra Hinton is a 23 y.o. G2P1001 at [redacted]w[redacted]d being seen today for ongoing prenatal care.  She is currently monitored for the following issues for this high-risk pregnancy and has Irritable bowel syndrome with constipation; Generalized anxiety disorder; Vitamin D deficiency; Hyperthyroidism; GERD (gastroesophageal reflux disease); Supervision of high risk pregnancy, antepartum; Maternal obesity, antepartum; Graves disease; and Pregnancy related nausea and vomiting, antepartum on their problem list.  ----------------------------------------------------------------------------------- Patient reports no complaints.   Contractions: Not present. Vag. Bleeding: None.  Movement: Present. No leaking of fluid.  ----------------------------------------------------------------------------------- The following portions of the patient's history were reviewed and updated as appropriate: allergies, current medications, past family history, past medical history, past social history, past surgical history and problem list. Problem list updated.   Objective  Blood pressure 120/70, weight 182 lb 6.4 oz (82.7 kg), last menstrual period 05/31/2018. Pregravid weight 185 lb (83.9 kg) Total Weight Gain -2 lb 9.6 oz (-1.179 kg) Urinalysis: Urine dipstick shows negative for glucose, protein.  Fetal Status: Fetal Heart Rate (bpm): 152 Fundal Height: 32 cm Movement: Present     General:  Alert, oriented and cooperative. Patient is in no acute distress.  Skin: Skin is warm and dry. No rash noted.   Cardiovascular: Normal heart rate noted  Respiratory: Normal respiratory effort, no problems with respiration noted  Abdomen: Soft, gravid, appropriate for gestational age. Pain/Pressure: Absent     Pelvic:  Cervical exam deferred        Extremities: Normal range of motion.  Edema: None  Mental Status: Normal mood and affect. Normal behavior. Normal judgment and thought  content.     Assessment   23 y.o. G2P1001 at [redacted]w[redacted]d, EDD 03/14/2019 by Ultrasound presenting for a routine prenatal visit.  Plan   Pregnancy#2 Problems (from 05/12/18 to present)    Problem Noted Resolved   Supervision of high risk pregnancy, antepartum 07/20/2018 by Malachy Mood, MD No   Overview Addendum 01/19/2019 11:44 AM by Rexene Agent, [place] Prenatal Labs  Dating 7wk1d [place] Blood type: O/Positive/-- (02/27 1127)   Genetic Screen NIPS: Negative XX SMA: Negative  FragileX: Negative CF: Negative Antibody:Comment, See Final Results (02/27 1127)  Anatomic [place] Complete 11/01/2018 Rubella: 7.32 (02/27 1127) Varicella: Immune  GTT 96 RPR: Non Reactive (02/27 1127)   Rhogam N/A HBsAg: Negative (02/27 1127)   TDaP vaccine 01/19/2019 Flu Shot: Declines HIV: Non Reactive (02/27 1127)   Baby Food Breast                             GBS:   Contraception  GBS prior pregnancy: no  Pelvis Tested   Pap: 05/31/2018 NIL  CS/VBAC N/A   Support Person Larkin Ina Herda             Please refer to After Visit Summary for other counseling recommendations.   Return in about 2 weeks (around 02/07/2019) for ROB.  Avel Sensor, CNM 01/24/2019  9:33 AM

## 2019-02-07 ENCOUNTER — Other Ambulatory Visit: Payer: Self-pay

## 2019-02-07 ENCOUNTER — Encounter: Payer: Self-pay | Admitting: Certified Nurse Midwife

## 2019-02-07 ENCOUNTER — Ambulatory Visit (INDEPENDENT_AMBULATORY_CARE_PROVIDER_SITE_OTHER): Payer: Commercial Managed Care - PPO

## 2019-02-07 ENCOUNTER — Ambulatory Visit (INDEPENDENT_AMBULATORY_CARE_PROVIDER_SITE_OTHER): Payer: Commercial Managed Care - PPO | Admitting: Certified Nurse Midwife

## 2019-02-07 VITALS — BP 110/80 | Wt 182.6 lb

## 2019-02-07 DIAGNOSIS — Z3A35 35 weeks gestation of pregnancy: Secondary | ICD-10-CM | POA: Diagnosis not present

## 2019-02-07 DIAGNOSIS — O26843 Uterine size-date discrepancy, third trimester: Secondary | ICD-10-CM | POA: Diagnosis not present

## 2019-02-07 DIAGNOSIS — O0990 Supervision of high risk pregnancy, unspecified, unspecified trimester: Secondary | ICD-10-CM

## 2019-02-07 DIAGNOSIS — O359XX0 Maternal care for (suspected) fetal abnormality and damage, unspecified, not applicable or unspecified: Secondary | ICD-10-CM

## 2019-02-07 LAB — POCT URINALYSIS DIPSTICK OB
Glucose, UA: NEGATIVE
POC,PROTEIN,UA: NEGATIVE

## 2019-02-07 NOTE — Progress Notes (Signed)
No problems.rj 

## 2019-02-09 ENCOUNTER — Encounter: Payer: Self-pay | Admitting: Obstetrics and Gynecology

## 2019-02-09 ENCOUNTER — Other Ambulatory Visit: Payer: Self-pay

## 2019-02-09 ENCOUNTER — Ambulatory Visit (INDEPENDENT_AMBULATORY_CARE_PROVIDER_SITE_OTHER): Payer: Commercial Managed Care - PPO | Admitting: Obstetrics and Gynecology

## 2019-02-09 VITALS — BP 116/78 | Wt 181.0 lb

## 2019-02-09 DIAGNOSIS — O99283 Endocrine, nutritional and metabolic diseases complicating pregnancy, third trimester: Secondary | ICD-10-CM

## 2019-02-09 DIAGNOSIS — O365930 Maternal care for other known or suspected poor fetal growth, third trimester, not applicable or unspecified: Secondary | ICD-10-CM | POA: Diagnosis not present

## 2019-02-09 DIAGNOSIS — Z6836 Body mass index (BMI) 36.0-36.9, adult: Secondary | ICD-10-CM | POA: Insufficient documentation

## 2019-02-09 DIAGNOSIS — O99213 Obesity complicating pregnancy, third trimester: Secondary | ICD-10-CM

## 2019-02-09 DIAGNOSIS — E0590 Thyrotoxicosis, unspecified without thyrotoxic crisis or storm: Secondary | ICD-10-CM

## 2019-02-09 DIAGNOSIS — E0500 Thyrotoxicosis with diffuse goiter without thyrotoxic crisis or storm: Secondary | ICD-10-CM

## 2019-02-09 DIAGNOSIS — O0993 Supervision of high risk pregnancy, unspecified, third trimester: Secondary | ICD-10-CM

## 2019-02-09 DIAGNOSIS — O99210 Obesity complicating pregnancy, unspecified trimester: Secondary | ICD-10-CM

## 2019-02-09 DIAGNOSIS — O0990 Supervision of high risk pregnancy, unspecified, unspecified trimester: Secondary | ICD-10-CM

## 2019-02-09 DIAGNOSIS — Z3A35 35 weeks gestation of pregnancy: Secondary | ICD-10-CM

## 2019-02-09 NOTE — Progress Notes (Signed)
Routine Prenatal Care Visit  Subjective  Debra Hinton is a 23 y.o. G2P1001 at [redacted]w[redacted]d being seen today for ongoing prenatal care.  She is currently monitored for the following issues for this high-risk pregnancy and has Irritable bowel syndrome with constipation; Generalized anxiety disorder; Vitamin D deficiency; Hyperthyroidism; GERD (gastroesophageal reflux disease); Supervision of high risk pregnancy, antepartum; Maternal obesity, antepartum; Graves disease; Pregnancy related nausea and vomiting, antepartum; and BMI 36.0-36.9,adult on their problem list.  ----------------------------------------------------------------------------------- Patient reports no complaints.   Contractions: Irritability. Vag. Bleeding: None.  Movement: Present. Leaking Fluid denies.  ----------------------------------------------------------------------------------- The following portions of the patient's history were reviewed and updated as appropriate: allergies, current medications, past family history, past medical history, past social history, past surgical history and problem list. Problem list updated.  Objective  Blood pressure 116/78, weight 181 lb (82.1 kg), last menstrual period 05/31/2018. Pregravid weight 185 lb (83.9 kg) Total Weight Gain -4 lb (-1.814 kg) Urinalysis: Urine Protein    Urine Glucose    Fetal Status: Fetal Heart Rate (bpm): 140   Movement: Present     General:  Alert, oriented and cooperative. Patient is in no acute distress.  Skin: Skin is warm and dry. No rash noted.   Cardiovascular: Normal heart rate noted  Respiratory: Normal respiratory effort, no problems with respiration noted  Abdomen: Soft, gravid, appropriate for gestational age. Pain/Pressure: Absent     Pelvic:  Cervical exam deferred        Extremities: Normal range of motion.  Edema: None  Mental Status: Normal mood and affect. Normal behavior. Normal judgment and thought content.   NST Baseline FHR: 140  beats/min Variability: moderate Accelerations: present Decelerations: absent Tocometry: not done  Interpretation:  INDICATIONS: intrauterine growth retardation and hyperthydroidism RESULTS:  A NST procedure was performed with FHR monitoring and a normal baseline established, appropriate time of 20-40 minutes of evaluation, and accels >2 seen w 15x15 characteristics.  Results show a REACTIVE NST.    Imaging Results US Ob Follow Up  Result Date: 02/09/2019 Patient Name: Debra Hinton DOB: June 24, 1995 MRN: [id number] ULTRASOUND REPORT Location: [facility] Date of Service: 02/07/2019 Indications:fundal height does not correlate to gestational age Findings: Nelda [place]se[place] intrauterine pregnancy is visualized with FHR at 130 BPM. Biometrics give an (U/S) Gestational age of [redacted]w[redacted]d and an (U/S) EDD of 03/25/2019; this is 11 days behind the clinically established Estimated Date of Delivery: 03/14/19. Fetal presentation is Cephalic. Placenta: posterior/fundal. Grade: 2 AFI: 13.1 cm Growth percentile is 17.0. EFW: 2,140g (4lbs 27OZ) Umbilical Artery Dopplers were performed due to [facility] <36%UYQI Systolic and Diastolic blood flow in each umbilical artery appear normal and without reversal or absence of diastolic flow. The maximum S/D ratio is 2.9. According to perinatology.com, this ratio is normal for this gestational age. Impression: 1. [redacted]w[redacted]d Viable Singleton Intrauterine pregnancy previously established criteria. 2. Growth is 17 %ile.  AFI is 13.1 cm. Vita Barley, RT The ultrasound images and findings were reviewed by me and I agree with the above report. Prentice Docker, MD, [facility], [facility] 02/09/2019 9:38 AM      Assessment   23 y.o. G2P1001 at [redacted]w[redacted]d by  03/14/2019, by Ultrasound presenting for routine prenatal visit  Plan   Pregnancy#2 Problems (from 05/12/18 to present)    Problem Noted Resolved   Supervision of high risk pregnancy, antepartum 07/20/2018 by Malachy Mood, MD No   Overview Addendum 01/19/2019 11:44 AM by Rexene Agent, [facility]  [facility] Prenatal Labs  Dating 7wk1d US Blood type: O/Positive/-- (02/27 1127)   Genetic Screen NIPS: Negative XX SMA: Negative  FragileX: Negative CF: Negative Antibody:Comment, See Final Results (02/27 1127)  Anatomic US Complete 11/01/2018 Rubella: 7.32 (02/27 1127) Varicella: Immune  GTT 96 RPR: Non Reactive (02/27 1127)   Rhogam N/A HBsAg: Negative (02/27 1127)   TDaP vaccine 01/19/2019 Flu Shot: Declines HIV: Non Reactive (02/27 1127)   Baby Food Breast                             GBS:   Contraception  GBS prior pregnancy: no  Pelvis Tested   Pap: 05/31/2018 NIL  CS/VBAC N/A   Support Person Jill AlexandersJustin Ehrich             Preterm labor symptoms and general obstetric precautions including but not limited to vaginal bleeding, contractions, leaking of fluid and fetal movement were reviewed in detail with the patient. Please refer to After Visit Summary for other counseling recommendations.   - NST reactive today - repeat AFI/UA dopps next week. - discussed that while overall growth was 17th %ile, 3/4 measurements below 10th %ile. Will watch closely with AFI and dopplers with NST. Consider IOL at 39 weeks - TFTs today due to long period with no check in setting of hyperthyroidism.  Return in about 4 days (around 02/13/2019) for 4 days ROB/NST, 1 week U/S AFI w UA dopplers with ROB/NST.  Thomasene MohairStephen Jackson, MD, Merlinda FrederickFACOG [facility], [facility] 02/09/2019 10:03 AM

## 2019-02-10 LAB — TSH+FREE T4
Free T4: 1.49 ng/dL (ref 0.82–1.77)
TSH: <0.005 u[IU]/mL — ABNORMAL LOW (ref 0.450–4.500)

## 2019-02-10 NOTE — Progress Notes (Signed)
ROB at 35 weeks: Baby active. Having some BH contractions. No vaginal bleeding or leakage of water FH today 32( only 2 cm growth since 30 weeks. Has had a net wait loss of 4# Ultrasound for growth today: CGA 33wk3d. Overall EFW 17th%(4#11oz), but AC 7.5%.  AFI WNL as was Doppler studies. Will discuss results with MD and advise patient of follow up Breast/ Baby [name]/ minipill Dalia Heading, CNM    Addendum: Called patient 9/17 after talking with Dr Gilman Schmidt. Dr Gilman Schmidt advised NSTs/ AFI/ Dopplers  Weekly, and increasing NSTs to twice a week after 36 weeks Deliver at 39 weeks if APTing WNL Repeat growth scan in 3-4 weeks Patient will be called to schedule NST 02/09/19 Dalia Heading, CNM

## 2019-02-13 ENCOUNTER — Other Ambulatory Visit (HOSPITAL_COMMUNITY)
Admission: RE | Admit: 2019-02-13 | Discharge: 2019-02-13 | Disposition: A | Discharge status: Home / Self Care | Payer: Commercial Managed Care - PPO | Source: Ambulatory Visit | Attending: Obstetrics and Gynecology | Admitting: Obstetrics and Gynecology

## 2019-02-13 ENCOUNTER — Ambulatory Visit (INDEPENDENT_AMBULATORY_CARE_PROVIDER_SITE_OTHER): Payer: Commercial Managed Care - PPO | Admitting: Advanced Practice Midwife

## 2019-02-13 ENCOUNTER — Other Ambulatory Visit: Payer: Self-pay | Admitting: Advanced Practice Midwife

## 2019-02-13 ENCOUNTER — Other Ambulatory Visit: Payer: Self-pay

## 2019-02-13 ENCOUNTER — Encounter: Payer: Self-pay | Admitting: Advanced Practice Midwife

## 2019-02-13 VITALS — BP 122/88 | Wt 182.0 lb

## 2019-02-13 DIAGNOSIS — O4703 False labor before 37 completed weeks of gestation, third trimester: Secondary | ICD-10-CM | POA: Diagnosis not present

## 2019-02-13 DIAGNOSIS — Z3A35 35 weeks gestation of pregnancy: Secondary | ICD-10-CM

## 2019-02-13 DIAGNOSIS — Z113 Encounter for screening for infections with a predominantly sexual mode of transmission: Secondary | ICD-10-CM

## 2019-02-13 DIAGNOSIS — O0990 Supervision of high risk pregnancy, unspecified, unspecified trimester: Secondary | ICD-10-CM

## 2019-02-13 DIAGNOSIS — Z3685 Encounter for antenatal screening for Streptococcus B: Secondary | ICD-10-CM

## 2019-02-13 DIAGNOSIS — O0993 Supervision of high risk pregnancy, unspecified, third trimester: Secondary | ICD-10-CM

## 2019-02-13 NOTE — Progress Notes (Signed)
ROB NST 

## 2019-02-13 NOTE — Progress Notes (Signed)
  Routine Prenatal Care Visit  Subjective  Debra Hinton is a 23 y.o. G2P1001 at [redacted]w[redacted]d being seen today for ongoing prenatal care.  She is currently monitored for the following issues for this high-risk pregnancy and has Irritable bowel syndrome with constipation; Generalized anxiety disorder; Vitamin D deficiency; Hyperthyroidism; GERD (gastroesophageal reflux disease); Supervision of high risk pregnancy, antepartum; Maternal obesity, antepartum; Graves disease; Pregnancy related nausea and vomiting, antepartum; and BMI 36.0-36.9,adult on their problem list.  ----------------------------------------------------------------------------------- Patient reports some cramping contractions 2 or 3 times per day over the last few days.   Contractions: Irregular. Vag. Bleeding: None.  Movement: Present. Leaking Fluid denies.  ----------------------------------------------------------------------------------- The following portions of the patient's history were reviewed and updated as appropriate: allergies, current medications, past family history, past medical history, past social history, past surgical history and problem list. Problem list updated.  Objective  Blood pressure 122/88, weight 182 lb (82.6 kg), last menstrual period 05/31/2018. Pregravid weight 185 lb (83.9 kg) Total Weight Gain -3 lb (-1.361 kg) Urinalysis: Urine Protein    Urine Glucose    Fetal Status: Fetal Heart Rate (bpm): 135   Movement: Present  Presentation: cephalic  NST: reactive 20 minute tracing, 135 bpm, moderate variability, +accelerations to 160, -decelerations  General:  Alert, oriented and cooperative. Patient is in no acute distress.  Skin: Skin is warm and dry. No rash noted.   Cardiovascular: Normal heart rate noted  Respiratory: Normal respiratory effort, no problems with respiration noted  Abdomen: Soft, gravid, appropriate for gestational age. Pain/Pressure: Absent     Pelvic:  Cervical exam performed       0.5/40/-2  Extremities: Normal range of motion.  Edema: None  Mental Status: Normal mood and affect. Normal behavior. Normal judgment and thought content.   Assessment   23 y.o. G2P1001 at [redacted]w[redacted]d by  03/14/2019, by Ultrasound presenting for routine prenatal visit  Plan   Pregnancy#2 Problems (from 05/12/18 to present)    Problem Noted Resolved   Supervision of high risk pregnancy, antepartum 07/20/2018 by Malachy Mood, MD No   Overview Addendum 02/10/2019  2:35 PM by Dalia Heading, [place] Prenatal Labs  Dating 7wk1d [place] Blood type: O/Positive/-- (02/27 1127)   Genetic Screen NIPS: Negative XX SMA: Negative  FragileX: Negative CF: Negative Antibody:Comment, See Final Results (02/27 1127)  Anatomic [place] Complete 11/01/2018 Rubella: 7.32 (02/27 1127) Varicella: Immune  GTT 96 RPR: Non Reactive (02/27 1127)   Rhogam N/A HBsAg: Negative (02/27 1127)   TDaP vaccine 01/19/2019 Flu Shot: Declines HIV: Non Reactive (02/27 1127)   Baby Food Breast                             GBS:   Contraception minipill GBS prior pregnancy: no  Pelvis Tested   Pap: 05/31/2018 NIL  CS/VBAC N/A   Support Person [place]          Preterm labor symptoms and general obstetric precautions including but not limited to vaginal bleeding, contractions, leaking of fluid and fetal movement were reviewed in detail with the patient.   Return for has follow up on Friday this week.  Rod Can, CNM 02/13/2019 3:31 PM

## 2019-02-14 ENCOUNTER — Encounter: Payer: Commercial Managed Care - PPO | Admitting: Obstetrics and Gynecology

## 2019-02-15 LAB — CERVICOVAGINAL ANCILLARY ONLY
Chlamydia: NEGATIVE
Neisseria Gonorrhea: NEGATIVE
Trichomonas: NEGATIVE

## 2019-02-15 LAB — STREP GP B NAA: Strep Gp B NAA: NEGATIVE

## 2019-02-16 ENCOUNTER — Other Ambulatory Visit: Payer: Self-pay

## 2019-02-16 ENCOUNTER — Ambulatory Visit (INDEPENDENT_AMBULATORY_CARE_PROVIDER_SITE_OTHER): Payer: Commercial Managed Care - PPO

## 2019-02-16 ENCOUNTER — Encounter: Payer: Self-pay | Admitting: Obstetrics and Gynecology

## 2019-02-16 ENCOUNTER — Ambulatory Visit (INDEPENDENT_AMBULATORY_CARE_PROVIDER_SITE_OTHER): Payer: Commercial Managed Care - PPO | Admitting: Obstetrics and Gynecology

## 2019-02-16 VITALS — BP 100/80 | Wt 182.0 lb

## 2019-02-16 DIAGNOSIS — O26843 Uterine size-date discrepancy, third trimester: Secondary | ICD-10-CM | POA: Diagnosis not present

## 2019-02-16 DIAGNOSIS — O99283 Endocrine, nutritional and metabolic diseases complicating pregnancy, third trimester: Secondary | ICD-10-CM

## 2019-02-16 DIAGNOSIS — O0990 Supervision of high risk pregnancy, unspecified, unspecified trimester: Secondary | ICD-10-CM

## 2019-02-16 DIAGNOSIS — E0590 Thyrotoxicosis, unspecified without thyrotoxic crisis or storm: Secondary | ICD-10-CM | POA: Diagnosis not present

## 2019-02-16 DIAGNOSIS — O365930 Maternal care for other known or suspected poor fetal growth, third trimester, not applicable or unspecified: Secondary | ICD-10-CM | POA: Diagnosis not present

## 2019-02-16 DIAGNOSIS — Z3A36 36 weeks gestation of pregnancy: Secondary | ICD-10-CM | POA: Diagnosis not present

## 2019-02-16 DIAGNOSIS — E0500 Thyrotoxicosis with diffuse goiter without thyrotoxic crisis or storm: Secondary | ICD-10-CM

## 2019-02-16 DIAGNOSIS — O219 Vomiting of pregnancy, unspecified: Secondary | ICD-10-CM

## 2019-02-16 DIAGNOSIS — Z6836 Body mass index (BMI) 36.0-36.9, adult: Secondary | ICD-10-CM

## 2019-02-16 DIAGNOSIS — O99210 Obesity complicating pregnancy, unspecified trimester: Secondary | ICD-10-CM

## 2019-02-16 LAB — POCT URINALYSIS DIPSTICK OB
Glucose, UA: NEGATIVE
POC,PROTEIN,UA: NEGATIVE

## 2019-02-16 NOTE — Progress Notes (Signed)
Routine Prenatal Care Visit  Subjective  Debra Hinton is a 23 y.o. G2P1001 at [redacted]w[redacted]d being seen today for ongoing prenatal care.  She is currently monitored for the following issues for this high-risk pregnancy and has Irritable bowel syndrome with constipation; Generalized anxiety disorder; Vitamin D deficiency; Hyperthyroidism; GERD (gastroesophageal reflux disease); Supervision of high risk pregnancy, antepartum; Maternal obesity, antepartum; Graves disease; Pregnancy related nausea and vomiting, antepartum; and BMI 36.0-36.9,adult on their problem list.  ----------------------------------------------------------------------------------- Patient reports no complaints.   Contractions: Irregular. Vag. Bleeding: None.  Movement: Present. Leaking Fluid denies.  U/S with normal AFI and UA dopplers ----------------------------------------------------------------------------------- The following portions of the patient's history were reviewed and updated as appropriate: allergies, current medications, past family history, past medical history, past social history, past surgical history and problem list. Problem list updated.  Objective  Blood pressure 100/80, weight 182 lb (82.6 kg), last menstrual period 05/31/2018. Pregravid weight 185 lb (83.9 kg) Total Weight Gain -3 lb (-1.361 kg) Urinalysis: Urine Protein Negative  Urine Glucose Negative  Fetal Status: Fetal Heart Rate (bpm): 140   Movement: Present  Presentation: Vertex  General:  Alert, oriented and cooperative. Patient is in no acute distress.  Skin: Skin is warm and dry. No rash noted.   Cardiovascular: Normal heart rate noted  Respiratory: Normal respiratory effort, no problems with respiration noted  Abdomen: Soft, gravid, appropriate for gestational age. Pain/Pressure: Absent     Pelvic:  Cervical exam deferred        Extremities: Normal range of motion.  Edema: None  Mental Status: Normal mood and affect. Normal behavior.  Normal judgment and thought content.   NST Baseline FHR: 140 beats/min Variability: moderate Accelerations: present Decelerations: absent Tocometry: not done  Interpretation:  INDICATIONS: fetus small for gestational age RESULTS:  A NST procedure was performed with FHR monitoring and a normal baseline established, appropriate time of 20-40 minutes of evaluation, and accels >2 seen w 15x15 characteristics.  Results show a REACTIVE NST.    Imaging Results US Ob Limited  Result Date: 02/16/2019 Patient Name: Debra Hinton DOB: 09/02/1995 MRN: [id number] ULTRASOUND REPORT Location: [facility] Date of Service: 02/16/2019 Indications:AFI Findings: [place] intrauterine pregnancy is visualized with FHR at 140 BPM. Fetal presentation is Cephalic. Placenta: anterior. Grade: 1 AFI: 10.8 cm Umbilical Artery Dopplers were performed due to small for dates. Systolic and Diastolic blood flow in each umbilical artery appear normal and without reversal or absence of diastolic flow. The maximum S/D ratio is 2.66. According to perinatology.com, this ratio is normal for this gestational age. Impression: 1. [redacted]w[redacted]d Viable Singleton Intrauterine pregnancy dated by previously established criteria. 2. AFI is 10.8 cm. 3. Normal umbilical artery dopplers. The ultrasound images and findings were reviewed by me and I agree with the above report. Thomasene Mohair, MD, [facility], [facility] 02/16/2019 2:31 PM       Assessment   23 y.o. G2P1001 at [redacted]w[redacted]d by  03/14/2019, by Ultrasound presenting for routine prenatal visit  Plan   Pregnancy#2 Problems (from 05/12/18 to present)    Problem Noted Resolved   Supervision of high risk pregnancy, antepartum 07/20/2018 by Vena Austria, MD No   Overview Addendum 02/10/2019  2:35 PM by Farrel Conners, CNM    Clinic [facility] Prenatal Labs  Dating 7wk1d [place] Blood type: O/Positive/-- (02/27 1127)   Genetic Screen NIPS: Negative XX SMA:  Negative  FragileX: Negative CF: Negative Antibody:Comment, See Final Results (02/27 1127)  Anatomic [place] Complete 11/01/2018 Rubella:  7.32 (02/27 1127) Varicella: Immune  GTT 96 RPR: Non Reactive (02/27 1127)   Rhogam N/A HBsAg: Negative (02/27 1127)   TDaP vaccine 01/19/2019 Flu Shot: Declines HIV: Non Reactive (02/27 1127)   Baby Food Breast                             GBS:   Contraception minipill GBS prior pregnancy: no  Pelvis Tested   Pap: 05/31/2018 NIL  CS/VBAC N/A   Support Person [place]          Preterm labor symptoms and general obstetric precautions including but not limited to vaginal bleeding, contractions, leaking of fluid and fetal movement were reviewed in detail with the patient. Please refer to After Visit Summary for other counseling recommendations.   Return in about 4 days (around 02/20/2019) for ROB/NST on 9/30, U/S with AFI/dopplers 1 week with ROB/NST (this pattern weekly, tuesday/friday).  Prentice Docker, MD, [facility], [facility] 02/16/2019 2:45 PM

## 2019-02-20 ENCOUNTER — Encounter: Payer: Self-pay | Admitting: Maternal Newborn

## 2019-02-20 ENCOUNTER — Other Ambulatory Visit: Payer: Self-pay

## 2019-02-20 ENCOUNTER — Ambulatory Visit (INDEPENDENT_AMBULATORY_CARE_PROVIDER_SITE_OTHER): Payer: Commercial Managed Care - PPO | Admitting: Maternal Newborn

## 2019-02-20 VITALS — BP 114/70 | Wt 182.0 lb

## 2019-02-20 DIAGNOSIS — Z3A36 36 weeks gestation of pregnancy: Secondary | ICD-10-CM | POA: Diagnosis not present

## 2019-02-20 DIAGNOSIS — O365930 Maternal care for other known or suspected poor fetal growth, third trimester, not applicable or unspecified: Secondary | ICD-10-CM

## 2019-02-20 DIAGNOSIS — O0990 Supervision of high risk pregnancy, unspecified, unspecified trimester: Secondary | ICD-10-CM

## 2019-02-20 NOTE — Progress Notes (Signed)
No vb. No lof. NST today °

## 2019-02-20 NOTE — Progress Notes (Signed)
    Routine Prenatal Care Visit  Subjective  Debra Hinton is a 23 y.o. G2P1001 at [redacted]w[redacted]d being seen today for ongoing prenatal care.  She is currently monitored for the following issues for this high-risk pregnancy and has Irritable bowel syndrome with constipation; Generalized anxiety disorder; Vitamin D deficiency; Hyperthyroidism; GERD (gastroesophageal reflux disease); Supervision of high risk pregnancy, antepartum; Maternal obesity, antepartum; Graves disease; Pregnancy related nausea and vomiting, antepartum; and BMI 36.0-36.9,adult on their problem list.  ----------------------------------------------------------------------------------- Patient reports pelvic pressure due to baby's position.   Contractions: Irregular. Vag. Bleeding: None.  Movement: Present. No leaking of fluid.  ----------------------------------------------------------------------------------- The following portions of the patient's history were reviewed and updated as appropriate: allergies, current medications, past family history, past medical history, past social history, past surgical history and problem list. Problem list updated.   Objective  Blood pressure 114/70, weight 182 lb (82.6 kg), last menstrual period 05/31/2018. Pregravid weight 185 lb (83.9 kg) Total Weight Gain -3 lb (-1.361 kg)  Fetal Status: Fetal Heart Rate (bpm): 135   Movement: Present     General:  Alert, oriented and cooperative. Patient is in no acute distress.  Skin: Skin is warm and dry. No rash noted.   Cardiovascular: Normal heart rate noted  Respiratory: Normal respiratory effort, no problems with respiration noted  Abdomen: Soft, gravid, appropriate for gestational age. Pain/Pressure: Absent     Pelvic:  Cervical exam deferred        Extremities: Normal range of motion.  Edema: None  Mental Status: Normal mood and affect. Normal behavior. Normal judgment and thought content.   NST Baseline: 135 bpm Variability: moderate  Accelerations: present Decelerations: absent Tocometry: not done The patient was monitored for 20 minutes, fetal heart rate tracing was deemed reactive  Assessment   23 y.o. G2P1001 at [redacted]w[redacted]d, EDD 03/14/2019 by Ultrasound presenting for a routine prenatal visit.  Plan   Pregnancy#2 Problems (from 05/12/18 to present)    Problem Noted Resolved   Supervision of high risk pregnancy, antepartum 07/20/2018 by Malachy Mood, MD No   Overview Addendum 02/10/2019  2:35 PM by Dalia Heading, [place] Prenatal Labs  Dating 7wk1d [place] Blood type: O/Positive/-- (02/27 1127)   Genetic Screen NIPS: Negative XX SMA: Negative  FragileX: Negative CF: Negative Antibody:Comment, See Final Results (02/27 1127)  Anatomic [place] Complete 11/01/2018 Rubella: 7.32 (02/27 1127) Varicella: Immune  GTT 96 RPR: Non Reactive (02/27 1127)   Rhogam N/A HBsAg: Negative (02/27 1127)   TDaP vaccine 01/19/2019 Flu Shot: Declines HIV: Non Reactive (02/27 1127)   Baby Food Breast                             GBS:   Contraception minipill GBS prior pregnancy: no  Pelvis Tested   Pap: 05/31/2018 NIL  CS/VBAC N/A   Support Person Larkin Ina Asano    Baby [name]       Reactive NST today.   Please refer to After Visit Summary for other counseling recommendations.   Keep scheduled ROB 10/2.  Avel Sensor, CNM 02/20/2019  16:16 PM

## 2019-02-20 NOTE — Patient Instructions (Signed)
Third Trimester of Pregnancy The third trimester is from week 28 through week 40 (months 7 through 9). The third trimester is a time when the unborn baby (fetus) is growing rapidly. At the end of the ninth month, the fetus is about 20 inches in length and weighs 6-10 pounds. Body changes during your third trimester Your body will continue to go through many changes during pregnancy. The changes vary from woman to woman. During the third trimester:  Your weight will continue to increase. You can expect to gain 25-35 pounds (11-16 kg) by the end of the pregnancy.  You may begin to get stretch marks on your hips, abdomen, and breasts.  You may urinate more often because the fetus is moving lower into your pelvis and pressing on your bladder.  You may develop or continue to have heartburn. This is caused by increased hormones that slow down muscles in the digestive tract.  You may develop or continue to have constipation because increased hormones slow digestion and cause the muscles that push waste through your intestines to relax.  You may develop hemorrhoids. These are swollen veins (varicose veins) in the rectum that can itch or be painful.  You may develop swollen, bulging veins (varicose veins) in your legs.  You may have increased body aches in the pelvis, back, or thighs. This is due to weight gain and increased hormones that are relaxing your joints.  You may have changes in your hair. These can include thickening of your hair, rapid growth, and changes in texture. Some women also have hair loss during or after pregnancy, or hair that feels dry or thin. Your hair will most likely return to normal after your baby is born.  Your breasts will continue to grow and they will continue to become tender. A yellow fluid (colostrum) may leak from your breasts. This is the first milk you are producing for your baby.  Your belly button may stick out.  You may notice more swelling in your hands,  face, or ankles.  You may have increased tingling or numbness in your hands, arms, and legs. The skin on your belly may also feel numb.  You may feel short of breath because of your expanding uterus.  You may have more problems sleeping. This can be caused by the size of your belly, increased need to urinate, and an increase in your body's metabolism.  You may notice the fetus "dropping," or moving lower in your abdomen (lightening).  You may have increased vaginal discharge.  You may notice your joints feel loose and you may have pain around your pelvic bone. What to expect at prenatal visits You will have prenatal exams every 2 weeks until week 36. Then you will have weekly prenatal exams. During a routine prenatal visit:  You will be weighed to make sure you and the baby are growing normally.  Your blood pressure will be taken.  Your abdomen will be measured to track your baby's growth.  The fetal heartbeat will be listened to.  Any test results from the previous visit will be discussed.  You may have a cervical check near your due date to see if your cervix has softened or thinned (effaced).  You will be tested for Group B streptococcus. This happens between 35 and 37 weeks. Your health care provider may ask you:  What your birth plan is.  How you are feeling.  If you are feeling the baby move.  If you have had any abnormal   symptoms, such as leaking fluid, bleeding, severe headaches, or abdominal cramping.  If you are using any tobacco products, including cigarettes, chewing tobacco, and electronic cigarettes.  If you have any questions. Other tests or screenings that may be performed during your third trimester include:  Blood tests that check for low iron levels (anemia).  Fetal testing to check the health, activity level, and growth of the fetus. Testing is done if you have certain medical conditions or if there are problems during the pregnancy.  Nonstress test  (NST). This test checks the health of your baby to make sure there are no signs of problems, such as the baby not getting enough oxygen. During this test, a belt is placed around your belly. The baby is made to move, and its heart rate is monitored during movement. What is false labor? False labor is a condition in which you feel small, irregular tightenings of the muscles in the womb (contractions) that usually go away with rest, changing position, or drinking water. These are called Braxton Hicks contractions. Contractions may last for hours, days, or even weeks before true labor sets in. If contractions come at regular intervals, become more frequent, increase in intensity, or become painful, you should see your health care provider. What are the signs of labor?  Abdominal cramps.  Regular contractions that start at 10 minutes apart and become stronger and more frequent with time.  Contractions that start on the top of the uterus and spread down to the lower abdomen and back.  Increased pelvic pressure and dull back pain.  A watery or bloody mucus discharge that comes from the vagina.  Leaking of amniotic fluid. This is also known as your "water breaking." It could be a slow trickle or a gush. Let your health care provider know if it has a color or strange odor. If you have any of these signs, call your health care provider right away, even if it is before your due date. Follow these instructions at home: Medicines  Follow your health care provider's instructions regarding medicine use. Specific medicines may be either safe or unsafe to take during pregnancy.  Take a prenatal vitamin that contains at least 600 micrograms (mcg) of folic acid.  If you develop constipation, try taking a stool softener if your health care provider approves. Eating and drinking   Eat a balanced diet that includes fresh fruits and vegetables, whole grains, good sources of protein such as meat, eggs, or tofu,  and low-fat dairy. Your health care provider will help you determine the amount of weight gain that is right for you.  Avoid raw meat and uncooked cheese. These carry germs that can cause birth defects in the baby.  If you have low calcium intake from food, talk to your health care provider about whether you should take a daily calcium supplement.  Eat four or five small meals rather than three large meals a day.  Limit foods that are high in fat and processed sugars, such as fried and sweet foods.  To prevent constipation: ? Drink enough fluid to keep your urine clear or pale yellow. ? Eat foods that are high in fiber, such as fresh fruits and vegetables, whole grains, and beans. Activity  Exercise only as directed by your health care provider. Most women can continue their usual exercise routine during pregnancy. Try to exercise for 30 minutes at least 5 days a week. Stop exercising if you experience uterine contractions.  Avoid heavy lifting.  Do   not exercise in extreme heat or humidity, or at high altitudes.  Wear low-heel, comfortable shoes.  Practice good posture.  You may continue to have sex unless your health care provider tells you otherwise. Relieving pain and discomfort  Take frequent breaks and rest with your legs elevated if you have leg cramps or low back pain.  Take warm sitz baths to soothe any pain or discomfort caused by hemorrhoids. Use hemorrhoid cream if your health care provider approves.  Wear a good support bra to prevent discomfort from breast tenderness.  If you develop varicose veins: ? Wear support pantyhose or compression stockings as told by your healthcare provider. ? Elevate your feet for 15 minutes, 3-4 times a day. Prenatal care  Write down your questions. Take them to your prenatal visits.  Keep all your prenatal visits as told by your health care provider. This is important. Safety  Wear your seat belt at all times when driving.  Make  a list of emergency phone numbers, including numbers for family, friends, the hospital, and police and fire departments. General instructions  Avoid cat litter boxes and soil used by cats. These carry germs that can cause birth defects in the baby. If you have a cat, ask someone to clean the litter box for you.  Do not travel far distances unless it is absolutely necessary and only with the approval of your health care provider.  Do not use hot tubs, steam rooms, or saunas.  Do not drink alcohol.  Do not use any products that contain nicotine or tobacco, such as cigarettes and e-cigarettes. If you need help quitting, ask your health care provider.  Do not use any medicinal herbs or unprescribed drugs. These chemicals affect the formation and growth of the baby.  Do not douche or use tampons or scented sanitary pads.  Do not cross your legs for long periods of time.  To prepare for the arrival of your baby: ? Take prenatal classes to understand, practice, and ask questions about labor and delivery. ? Make a trial run to the hospital. ? Visit the hospital and tour the maternity area. ? Arrange for maternity or paternity leave through employers. ? Arrange for family and friends to take care of pets while you are in the hospital. ? Purchase a rear-facing car seat and make sure you know how to install it in your car. ? Pack your hospital bag. ? Prepare the baby's nursery. Make sure to remove all pillows and stuffed animals from the baby's crib to prevent suffocation.  Visit your dentist if you have not gone during your pregnancy. Use a soft toothbrush to brush your teeth and be gentle when you floss. Contact a health care provider if:  You are unsure if you are in labor or if your water has broken.  You become dizzy.  You have mild pelvic cramps, pelvic pressure, or nagging pain in your abdominal area.  You have lower back pain.  You have persistent nausea, vomiting, or diarrhea.   You have an unusual or bad smelling vaginal discharge.  You have pain when you urinate. Get help right away if:  Your water breaks before 37 weeks.  You have regular contractions less than 5 minutes apart before 37 weeks.  You have a fever.  You are leaking fluid from your vagina.  You have spotting or bleeding from your vagina.  You have severe abdominal pain or cramping.  You have rapid weight loss or weight gain.  You have   shortness of breath with chest pain.  You notice sudden or extreme swelling of your face, hands, ankles, feet, or legs.  Your baby makes fewer than 10 movements in 2 hours.  You have severe headaches that do not go away when you take medicine.  You have vision changes. Summary  The third trimester is from week 28 through week 40, months 7 through 9. The third trimester is a time when the unborn baby (fetus) is growing rapidly.  During the third trimester, your discomfort may increase as you and your baby continue to gain weight. You may have abdominal, leg, and back pain, sleeping problems, and an increased need to urinate.  During the third trimester your breasts will keep growing and they will continue to become tender. A yellow fluid (colostrum) may leak from your breasts. This is the first milk you are producing for your baby.  False labor is a condition in which you feel small, irregular tightenings of the muscles in the womb (contractions) that eventually go away. These are called Braxton Hicks contractions. Contractions may last for hours, days, or even weeks before true labor sets in.  Signs of labor can include: abdominal cramps; regular contractions that start at 10 minutes apart and become stronger and more frequent with time; watery or bloody mucus discharge that comes from the vagina; increased pelvic pressure and dull back pain; and leaking of amniotic fluid. This information is not intended to replace advice given to you by your health  care provider. Make sure you discuss any questions you have with your health care provider. Document Released: 05/04/2001 Document Revised: 08/31/2018 Document Reviewed: 06/15/2016 Elsevier Patient Education  2020 [employer]  

## 2019-02-23 ENCOUNTER — Inpatient Hospital Stay
Admission: EM | Admit: 2019-02-23 | Discharge: 2019-02-27 | DRG: 807 | Disposition: A | Discharge status: Home / Self Care | Payer: Commercial Managed Care - PPO | Attending: Obstetrics and Gynecology | Admitting: Obstetrics and Gynecology

## 2019-02-23 ENCOUNTER — Encounter: Payer: Self-pay | Admitting: Advanced Practice Midwife

## 2019-02-23 ENCOUNTER — Ambulatory Visit (INDEPENDENT_AMBULATORY_CARE_PROVIDER_SITE_OTHER): Payer: Commercial Managed Care - PPO

## 2019-02-23 ENCOUNTER — Other Ambulatory Visit: Payer: Self-pay

## 2019-02-23 ENCOUNTER — Ambulatory Visit (INDEPENDENT_AMBULATORY_CARE_PROVIDER_SITE_OTHER): Payer: Commercial Managed Care - PPO | Admitting: Advanced Practice Midwife

## 2019-02-23 VITALS — BP 126/80 | Wt 183.0 lb

## 2019-02-23 DIAGNOSIS — O365990 Maternal care for other known or suspected poor fetal growth, unspecified trimester, not applicable or unspecified: Secondary | ICD-10-CM | POA: Diagnosis present

## 2019-02-23 DIAGNOSIS — Z87891 Personal history of nicotine dependence: Secondary | ICD-10-CM

## 2019-02-23 DIAGNOSIS — Z6836 Body mass index (BMI) 36.0-36.9, adult: Secondary | ICD-10-CM

## 2019-02-23 DIAGNOSIS — O365930 Maternal care for other known or suspected poor fetal growth, third trimester, not applicable or unspecified: Principal | ICD-10-CM | POA: Diagnosis present

## 2019-02-23 DIAGNOSIS — O0990 Supervision of high risk pregnancy, unspecified, unspecified trimester: Secondary | ICD-10-CM

## 2019-02-23 DIAGNOSIS — O0993 Supervision of high risk pregnancy, unspecified, third trimester: Secondary | ICD-10-CM

## 2019-02-23 DIAGNOSIS — E0590 Thyrotoxicosis, unspecified without thyrotoxic crisis or storm: Secondary | ICD-10-CM | POA: Diagnosis present

## 2019-02-23 DIAGNOSIS — Z20828 Contact with and (suspected) exposure to other viral communicable diseases: Secondary | ICD-10-CM | POA: Diagnosis present

## 2019-02-23 DIAGNOSIS — Z3A37 37 weeks gestation of pregnancy: Secondary | ICD-10-CM

## 2019-02-23 DIAGNOSIS — O99283 Endocrine, nutritional and metabolic diseases complicating pregnancy, third trimester: Secondary | ICD-10-CM | POA: Diagnosis not present

## 2019-02-23 DIAGNOSIS — O99210 Obesity complicating pregnancy, unspecified trimester: Secondary | ICD-10-CM | POA: Diagnosis present

## 2019-02-23 LAB — TYPE AND SCREEN
ABO/RH(D): O POS
Antibody Screen: NEGATIVE

## 2019-02-23 LAB — POCT URINALYSIS DIPSTICK OB
Glucose, UA: NEGATIVE
POC,PROTEIN,UA: NEGATIVE

## 2019-02-23 LAB — SARS CORONAVIRUS 2 BY RT PCR (HOSPITAL ORDER, PERFORMED IN ~~LOC~~ HOSPITAL LAB): SARS Coronavirus 2: NEGATIVE

## 2019-02-23 LAB — CBC
HCT: 37.6 % (ref 36.0–46.0)
Hemoglobin: 12.8 g/dL (ref 12.0–15.0)
MCH: 29.2 pg (ref 26.0–34.0)
MCHC: 34 g/dL (ref 30.0–36.0)
MCV: 85.6 fL (ref 80.0–100.0)
Platelets: 211 10*3/uL (ref 150–400)
RBC: 4.39 MIL/uL (ref 3.87–5.11)
RDW: 12.8 % (ref 11.5–15.5)
WBC: 8.2 10*3/uL (ref 4.0–10.5)
nRBC: 0 % (ref 0.0–0.2)

## 2019-02-23 MED ORDER — LACTATED RINGERS IV SOLN: 500.0000 mL | INTRAVENOUS | Status: DC | PRN

## 2019-02-23 MED ORDER — OXYTOCIN 10 UNIT/ML IJ SOLN: 10.0000 [IU] | Freq: Once | INTRAMUSCULAR | Status: DC

## 2019-02-23 MED ORDER — OXYTOCIN 40 UNITS IN NORMAL SALINE INFUSION - SIMPLE MED: 2.5000 [IU]/h | INTRAVENOUS | Status: DC

## 2019-02-23 MED ORDER — OXYTOCIN BOLUS FROM INFUSION
500.0000 mL | Freq: Once | INTRAVENOUS | Status: AC
  Administered 2019-02-25: 16:00:00 500 mL via INTRAVENOUS

## 2019-02-23 MED ORDER — SOD CITRATE-CITRIC ACID 500-334 MG/5ML PO SOLN: 30.0000 mL | ORAL | Status: DC | PRN

## 2019-02-23 MED ORDER — LACTATED RINGERS IV SOLN
INTRAVENOUS | Status: DC
  Administered 2019-02-23 – 2019-02-25 (×5): via INTRAVENOUS

## 2019-02-23 MED ORDER — LIDOCAINE HCL (PF) 1 % IJ SOLN: 30.0000 mL | INTRAMUSCULAR | Status: DC | PRN

## 2019-02-23 MED ORDER — ONDANSETRON HCL 4 MG/2ML IJ SOLN
4.0000 mg | Freq: Four times a day (QID) | INTRAMUSCULAR | Status: DC | PRN
  Administered 2019-02-25: 10:00:00 4 mg via INTRAVENOUS
  Filled 2019-02-23: qty 2

## 2019-02-23 NOTE — H&P (Signed)
OB History & Physical   History of Present Illness:  Chief Complaint: induction of labor  HPI:  Debra Hinton is a 23 y.o. G2P1001 female at 58104w2d dated by 7 week ultrasound.  Her pregnancy has been complicated by depression, hyperthyroidism (hasn't required medication), fetal growth restriction with elevated umbilical artery doppler elevation.    She denies contractions.   She denies leakage of fluid.   She denies vaginal bleeding.   She reports fetal movement.    Total weight gain for pregnancy: -0.907 kg   Obstetrical Problem List: Pregnancy#2 Problems (from 05/12/18 to present)    Problem Noted Resolved   Intrauterine growth restriction, antepartum 02/23/2019 by Conard NovakJackson, Durand Wittmeyer D, MD No   Supervision of high risk pregnancy, antepartum 07/20/2018 by Vena AustriaStaebler, Andreas, MD No   Overview Addendum 02/10/2019  2:35 PM by Farrel ConnersGutierrez, Colleen, CNM    Clinic [facility] Prenatal Labs  Dating 7wk1d US Blood type: O/Positive/-- (02/27 1127)   Genetic Screen NIPS: Negative XX SMA: Negative  FragileX: Negative CF: Negative Antibody:Comment, See Final Results (02/27 1127)  Anatomic US Complete 11/01/2018 Rubella: 7.32 (02/27 1127)  Varicella: Immune  GTT 96 RPR: Non Reactive (02/27 1127)   Rhogam N/A HBsAg: Negative (02/27 1127)   TDaP vaccine 01/19/2019 Flu Shot: Declines HIV: Non Reactive (02/27 1127)   Baby Food Breast                             GBS: negative 02/13/2019  Contraception minipill GBS prior pregnancy: no  Pelvis Tested   Pap: 05/31/2018 NIL  CS/VBAC N/A   Support Person [place]    Baby [name]          Maternal Medical History:   Past Medical History:  Diagnosis Date  . Anxiety   . Anxiety and depression   . Depressive disorder   . GERD (gastroesophageal reflux disease)   . Hyperthyroidism 2019   Possible Graves disease  . IBS (irritable bowel syndrome)   . Irritable bowel syndrome (IBS)   . Thyroid disease     Past Surgical History:  Procedure  Laterality Date  . NO PAST SURGERIES     Allergies: No Known Allergies  Prior to Admission medications   Medication Sig Start Date End Date Taking? Authorizing Provider  multivitamin (VIT W/EXTRA C) CHEW chewable tablet Chew 2 tablets by mouth daily.    [provider]  ondansetron (ZOFRAN ODT) 4 MG disintegrating tablet Take 1 tablet (4 mg total) by mouth every 8 (eight) hours as needed for nausea or vomiting. 11/01/18   Oswaldo ConroySchmid, Jacelyn Y, CNM  sucralfate (CARAFATE) 1 g tablet Take 1 tablet (1 g total) by mouth 4 (four) times daily -  with meals and at bedtime. 09/07/18   Farrel ConnersGutierrez, Colleen, CNM    OB History  Gravida Para Term Preterm AB Living  2 1 1  0 0 1  SAB TAB Ectopic Multiple Live Births  0 0 0   1    # Outcome Date GA Lbr Len/2nd Weight Sex Delivery Anes PTL Lv  2 Current           1 Term 02/11/15 3143w1d / 01:04 3402 g M Vag-Spont EPI  LIV    Prenatal care site: [facility]  Social History: She  reports that she quit smoking about 7 months ago. She has never used smokeless tobacco. She reports current alcohol use. She reports that she does not use drugs.  Family  History: family history includes Alcoholism in her paternal grandmother; Breast cancer in her maternal aunt and another family member; Cancer in her paternal grandfather; Dementia in her maternal grandmother; Diabetes in her paternal grandmother; Healthy in her brother; Heart attack in her maternal grandfather; Hyperlipidemia in her father; Hypertension in her mother; Hypothyroidism in her maternal aunt and maternal aunt; Thyroid disease in her maternal grandmother.   Review of Systems:  Review of Systems  Constitutional: Negative.   HENT: Negative.   Eyes: Negative.   Respiratory: Negative.   Cardiovascular: Negative.   Gastrointestinal: Negative.   Genitourinary: Negative.   Musculoskeletal: Negative.   Skin: Negative.   Neurological: Negative.   Psychiatric/Behavioral: Negative.      Physical  Exam:  BP 139/76 (BP Location: Right Arm)   Pulse 80   Temp 98.3 F (36.8 C) (Oral)   Resp 18   Ht 5' (1.524 m)   Wt 83 kg   LMP 05/31/2018   BMI 35.74 kg/m   Physical Exam Constitutional:      General: She is not in acute distress.    Appearance: Normal appearance. She is well-developed.  HENT:     Head: Normocephalic and atraumatic.  Eyes:     General: No scleral icterus.    Conjunctiva/sclera: Conjunctivae normal.  Neck:     Musculoskeletal: Normal range of motion and neck supple.  Cardiovascular:     Rate and Rhythm: Normal rate and regular rhythm.     Heart sounds: No murmur. No friction rub. No gallop.   Pulmonary:     Effort: Pulmonary effort is normal. No respiratory distress.     Breath sounds: Normal breath sounds. No wheezing or rales.  Abdominal:     General: Bowel sounds are normal. There is no distension.     Palpations: Abdomen is soft. There is no mass.     Tenderness: There is no abdominal tenderness. There is no guarding or rebound.  Musculoskeletal: Normal range of motion.  Neurological:     General: No focal deficit present.     Mental Status: She is alert and oriented to person, place, and time.     Cranial Nerves: No cranial nerve deficit.  Skin:    General: Skin is warm and dry.     Findings: No erythema.  Psychiatric:        Mood and Affect: Mood normal.        Behavior: Behavior normal.        Judgment: Judgment normal.     Baseline FHR: 135 beats/min   Variability: moderate   Accelerations: present   Decelerations: absent Contractions: absent  Overall assessment: cat 1  Ultrasound in clinic today:  Number of Fetus: 1  Presentation: cephalic  Fluid: 25.0 cm  Placental Location: right lateral  S/D ratio umbilical artery dopplers: 3.43 (>95th%ile = 3.34)   Biometrics: total growth - 11.3%ile     HC: 2.9 %ile     BPD: 11.2%ile     AC: 8.9%ile     FL: 2.4%ile  No results found for: SARSCOV2NAA  Assessment:  Debra Hinton is a  23 y.o. G57P1001 female at [redacted]w[redacted]d with fetal growth restriction now with elevated S/D ratio.  Though her overall growth is 11.3%ile, the biometrics are concerning and now she has an elevated umbilical artery doppler.    Plan:  1. Admit to Labor & Delivery  2. CBC, T&S, Clrs, IVF 3. GBS negative.   4. Fetwal well-being: reaassuring 5. IOL: Will perform cervical  exam. But, will likely need misoprostol for cervical ripening.    Thomasene Mohair, MD 02/23/2019 7:55 PM

## 2019-02-23 NOTE — Progress Notes (Addendum)
Routine Prenatal Care Visit  Subjective  Debra Hinton is a 23 y.o. G2P1001 at [redacted]w[redacted]d being seen today for ongoing prenatal care.  She is currently monitored for the following issues for this high-risk pregnancy and has Irritable bowel syndrome with constipation; Generalized anxiety disorder; Vitamin D deficiency; Hyperthyroidism; GERD (gastroesophageal reflux disease); Supervision of high risk pregnancy, antepartum; Maternal obesity, antepartum; Graves disease; Pregnancy related nausea and vomiting, antepartum; and BMI 36.0-36.9,adult on their problem list.  ----------------------------------------------------------------------------------- Patient reports no complaints.  We discussed growth scan and dopplers today and recommendation from Dr Glennon Mac to go to L&D for IOL Contractions: Irregular. Vag. Bleeding: None.  Movement: Present. Leaking Fluid denies.  ----------------------------------------------------------------------------------- The following portions of the patient's history were reviewed and updated as appropriate: allergies, current medications, past family history, past medical history, past social history, past surgical history and problem list. Problem list updated.  Objective  Blood pressure 126/80, weight 183 lb (83 kg), last menstrual period 05/31/2018. Pregravid weight 185 lb (83.9 kg) Total Weight Gain -2 lb (-0.907 kg) Urinalysis: Urine Protein Negative  Urine Glucose Negative  Fetal Status: Fetal Heart Rate (bpm): 140   Movement: Present  Presentation: Vertex   NST 20 minute tracing is reactive, 140 bpm baseline, moderate variability, +accelerations, -decelerations  Patient Name: Debra Hinton DOB: 06/21/1995 MRN: [id number]  ULTRASOUND REPORT  Location: [facility] Date of Service: 02/23/2019   Indications:growth/afi and u/a dopplers Findings:  Nelda Marseille intrauterine pregnancy is visualized with FHR at 140 BPM. Biometrics give an (U/S)  Gestational age of [redacted]w[redacted]d and an (U/S) EDD of 04/01/2019; this correlates with the clinically established Estimated Date of Delivery: 03/14/19.  Fetal presentation is Cephalic.  Placenta: right lateral  Grade: 1 AFI: 11.8 cm  Growth percentile is 11.3%. EFW: 2540 g  ( 5 lb 10 oz )  Umbilical Artery Dopplers were performed due to small for dates.   Systolic and Diastolic blood flow in each umbilical artery appear abnormal and without reversal or absence of diastolic flow.   The maximum S/D ratio is 3.4.   According to perinatology.com, this ratio is abnormal for this gestational age (50th%ile = 2.34, 95th %ile = 3.34).   Impression: 1. [redacted]w[redacted]d Viable Singleton Intrauterine pregnancy previously established criteria. 2. Growth is 11.3 %ile.  AFI is 11.8 cm.   Gweneth Dimitri, RT   The ultrasound images and findings were reviewed by me and I agree with the above report.  Prentice Docker, MD, [facility], [facility] 02/23/2019 3:45 PM       General:  Alert, oriented and cooperative. Patient is in no acute distress.  Skin: Skin is warm and dry. No rash noted.   Cardiovascular: Normal heart rate noted  Respiratory: Normal respiratory effort, no problems with respiration noted  Abdomen: Soft, gravid, appropriate for gestational age. Pain/Pressure: Absent     Pelvic:  Cervical exam deferred        Extremities: Normal range of motion.  Edema: None  Mental Status: Normal mood and affect. Normal behavior. Normal judgment and thought content.   Assessment   23 y.o. G2P1001 at [redacted]w[redacted]d by  03/14/2019, by Ultrasound presenting for routine prenatal visit  Plan   Pregnancy#2 Problems (from 05/12/18 to present)    Problem Noted Resolved   Supervision of high risk pregnancy, antepartum 07/20/2018 by Malachy Mood, MD No   Overview Addendum 02/10/2019  2:35 PM by Dalia Heading, [facility] Prenatal Labs  Dating 7wk1d [place] Blood type:  O/Positive/-- (02/27 1127)   Genetic Screen NIPS: Negative XX SMA: Negative  FragileX: Negative CF: Negative Antibody:Comment, See Final Results (02/27 1127)  Anatomic [place] Complete 11/01/2018 Rubella: 7.32 (02/27 1127) Varicella: Immune  GTT 96 RPR: Non Reactive (02/27 1127)   Rhogam N/A HBsAg: Negative (02/27 1127)   TDaP vaccine 01/19/2019 Flu Shot: Declines HIV: Non Reactive (02/27 1127)   Baby Food Breast                             GBS:   Contraception minipill GBS prior pregnancy: no  Pelvis Tested   Pap: 05/31/2018 NIL  CS/VBAC N/A   Support Person [facility]          Term labor symptoms and general obstetric precautions including but not limited to vaginal bleeding, contractions, leaking of fluid and fetal movement were reviewed in detail with the patient.  Recommend go to L&D for delivery (overall growth at 11% but HC is at 2% and elevated UA dopplers) per Dr Michel Bickers, CNM 02/23/2019 5:36 PM

## 2019-02-23 NOTE — Progress Notes (Signed)
ROB AFI/Doppler

## 2019-02-24 LAB — RPR: RPR Ser Ql: NONREACTIVE

## 2019-02-24 MED ORDER — BUTORPHANOL TARTRATE 1 MG/ML IJ SOLN
1.0000 mg | INTRAMUSCULAR | Status: DC | PRN
  Administered 2019-02-25: 1 mg via INTRAVENOUS
  Filled 2019-02-24 (×2): qty 1

## 2019-02-24 MED ORDER — TERBUTALINE SULFATE 1 MG/ML IJ SOLN: 0.2500 mg | Freq: Once | INTRAMUSCULAR | Status: DC | PRN

## 2019-02-24 MED ORDER — MISOPROSTOL 25 MCG QUARTER TABLET
25.0000 ug | ORAL_TABLET | ORAL | Status: DC | PRN
  Administered 2019-02-24 (×2): 25 ug via VAGINAL
  Filled 2019-02-24 (×3): qty 1

## 2019-02-24 MED ORDER — AMMONIA AROMATIC IN INHA
RESPIRATORY_TRACT | Status: AC
  Filled 2019-02-24: qty 10

## 2019-02-24 MED ORDER — OXYTOCIN 10 UNIT/ML IJ SOLN
INTRAMUSCULAR | Status: AC
  Filled 2019-02-24: qty 2

## 2019-02-24 MED ORDER — MISOPROSTOL 25 MCG QUARTER TABLET
25.0000 ug | ORAL_TABLET | Freq: Once | ORAL | Status: AC
  Administered 2019-02-24: 25 ug via ORAL

## 2019-02-24 MED ORDER — LIDOCAINE HCL (PF) 1 % IJ SOLN
INTRAMUSCULAR | Status: AC
  Filled 2019-02-24: qty 30

## 2019-02-24 MED ORDER — OXYTOCIN 40 UNITS IN NORMAL SALINE INFUSION - SIMPLE MED
1.0000 m[IU]/min | INTRAVENOUS | Status: DC
  Administered 2019-02-24: 17:00:00 1 m[IU]/min via INTRAVENOUS

## 2019-02-24 MED ORDER — MISOPROSTOL 200 MCG PO TABS
ORAL_TABLET | ORAL | Status: AC
  Filled 2019-02-24: qty 4

## 2019-02-24 MED ORDER — OXYTOCIN 40 UNITS IN NORMAL SALINE INFUSION - SIMPLE MED
INTRAVENOUS | Status: AC
  Administered 2019-02-24: 17:00:00 1 m[IU]/min via INTRAVENOUS
  Filled 2019-02-24: qty 1000

## 2019-02-24 NOTE — Progress Notes (Signed)
Late entry: time of patient visit 12:40 PM, 02/24/2019   Labor Progress Note   23 y.o. G2P1001 @ [redacted]w[redacted]d , admitted for  Pregnancy, Labor Management.   Subjective:  Patient feeling some abdominal and back pain- coping well.  Objective:  BP 119/69   Pulse (!) 54   Temp 98 F (36.7 C) (Oral)   Resp 16   Ht 5' (1.524 m)   Wt 83 kg   LMP 05/31/2018   BMI 35.74 kg/m  Abd: gravid, ND, FHT present, mild tenderness on exam Extr: trace edema SVE: CERVIX: 1 cm dilated, 50 effaced, -3 station, did not tolerate exam well and declined cervical sweep  EFM: FHR: 135 bpm, variability: moderate,  accelerations:  Present,  decelerations:  Absent Toco: Frequency: irritability Labs: I have reviewed the patient's lab results.   Assessment & Plan:  G2P1001 @ [redacted]w[redacted]d, admitted for  Pregnancy and Labor/Delivery Management  1. Pain management: none. 2. FWB: FHT category I.  3. ID: GBS negative 4. Labor management: cytotec 25 mcg vaginal, 25 mcg buccal  All discussed with patient, see orders   Rod Can, [facility] 02/24/2019  10:06 PM

## 2019-02-25 ENCOUNTER — Inpatient Hospital Stay: Payer: Commercial Managed Care - PPO | Admitting: Anesthesiology

## 2019-02-25 ENCOUNTER — Encounter: Payer: Self-pay | Admitting: Anesthesiology

## 2019-02-25 LAB — WET PREP, GENITAL
Clue Cells Wet Prep HPF POC: NONE SEEN
Sperm: NONE SEEN
Trich, Wet Prep: NONE SEEN
Yeast Wet Prep HPF POC: NONE SEEN

## 2019-02-25 MED ORDER — PHENYLEPHRINE 40 MCG/ML (10ML) SYRINGE FOR IV PUSH (FOR BLOOD PRESSURE SUPPORT): 80.0000 ug | PREFILLED_SYRINGE | INTRAVENOUS | Status: DC | PRN

## 2019-02-25 MED ORDER — SIMETHICONE 80 MG PO CHEW: 80.0000 mg | CHEWABLE_TABLET | ORAL | Status: DC | PRN

## 2019-02-25 MED ORDER — BUPIVACAINE HCL (PF) 0.25 % IJ SOLN
INTRAMUSCULAR | Status: DC | PRN
  Administered 2019-02-25 (×4): 5 mL via EPIDURAL

## 2019-02-25 MED ORDER — LACTATED RINGERS IV SOLN
500.0000 mL | Freq: Once | INTRAVENOUS | Status: AC
  Administered 2019-02-25: 500 mL via INTRAVENOUS

## 2019-02-25 MED ORDER — FENTANYL 2.5 MCG/ML W/ROPIVACAINE 0.15% IN NS 100 ML EPIDURAL (ARMC)
12.0000 mL/h | EPIDURAL | Status: DC
  Administered 2019-02-25 (×2): 12 mL/h via EPIDURAL
  Filled 2019-02-25: qty 100

## 2019-02-25 MED ORDER — IBUPROFEN 600 MG PO TABS
600.0000 mg | ORAL_TABLET | Freq: Four times a day (QID) | ORAL | Status: DC
  Administered 2019-02-25 – 2019-02-27 (×7): 600 mg via ORAL
  Filled 2019-02-25 (×7): qty 1

## 2019-02-25 MED ORDER — CEFAZOLIN SODIUM-DEXTROSE 2-4 GM/100ML-% IV SOLN
2.0000 g | Freq: Once | INTRAVENOUS | Status: AC
  Administered 2019-02-25: 17:00:00 2 g via INTRAVENOUS
  Filled 2019-02-25: qty 100

## 2019-02-25 MED ORDER — SODIUM CHLORIDE 0.9 % IV SOLN
INTRAVENOUS | Status: DC | PRN
  Administered 2019-02-25 (×4): 5 mL via EPIDURAL

## 2019-02-25 MED ORDER — ACETAMINOPHEN 325 MG PO TABS: 650.0000 mg | ORAL_TABLET | ORAL | Status: DC | PRN

## 2019-02-25 MED ORDER — EPHEDRINE 5 MG/ML INJ: 10.0000 mg | INTRAVENOUS | Status: DC | PRN

## 2019-02-25 MED ORDER — LIDOCAINE-EPINEPHRINE (PF) 1.5 %-1:200000 IJ SOLN
INTRAMUSCULAR | Status: DC | PRN
  Administered 2019-02-25 (×2): 3 mL via EPIDURAL

## 2019-02-25 MED ORDER — ONDANSETRON HCL 4 MG/2ML IJ SOLN: 4.0000 mg | INTRAMUSCULAR | Status: DC | PRN

## 2019-02-25 MED ORDER — DIBUCAINE (PERIANAL) 1 % EX OINT: 1.0000 "application " | TOPICAL_OINTMENT | CUTANEOUS | Status: DC | PRN

## 2019-02-25 MED ORDER — PRENATAL MULTIVITAMIN CH
1.0000 | ORAL_TABLET | Freq: Every day | ORAL | Status: DC
  Administered 2019-02-26 – 2019-02-27 (×2): 1 via ORAL
  Filled 2019-02-25 (×2): qty 1

## 2019-02-25 MED ORDER — WITCH HAZEL-GLYCERIN EX PADS: 1.0000 "application " | MEDICATED_PAD | CUTANEOUS | Status: DC | PRN

## 2019-02-25 MED ORDER — ONDANSETRON HCL 4 MG PO TABS: 4.0000 mg | ORAL_TABLET | ORAL | Status: DC | PRN

## 2019-02-25 MED ORDER — DIPHENHYDRAMINE HCL 50 MG/ML IJ SOLN: 12.5000 mg | INTRAMUSCULAR | Status: DC | PRN

## 2019-02-25 MED ORDER — FENTANYL 2.5 MCG/ML W/ROPIVACAINE 0.15% IN NS 100 ML EPIDURAL (ARMC)
EPIDURAL | Status: AC
  Filled 2019-02-25: qty 100

## 2019-02-25 MED ORDER — BENZOCAINE-MENTHOL 20-0.5 % EX AERO
1.0000 "application " | INHALATION_SPRAY | CUTANEOUS | Status: DC | PRN
  Administered 2019-02-25: 1 via TOPICAL
  Filled 2019-02-25: qty 56

## 2019-02-25 MED ORDER — DIPHENHYDRAMINE HCL 25 MG PO CAPS: 25.0000 mg | ORAL_CAPSULE | Freq: Four times a day (QID) | ORAL | Status: DC | PRN

## 2019-02-25 MED ORDER — LIDOCAINE HCL (PF) 1 % IJ SOLN
INTRAMUSCULAR | Status: DC | PRN
  Administered 2019-02-25: 4 mL via SUBCUTANEOUS
  Administered 2019-02-25: 1 mL via INTRADERMAL

## 2019-02-25 MED ORDER — COCONUT OIL OIL
1.0000 "application " | TOPICAL_OIL | Status: DC | PRN
  Administered 2019-02-27: 1 via TOPICAL
  Filled 2019-02-25: qty 120

## 2019-02-25 MED ORDER — SENNOSIDES-DOCUSATE SODIUM 8.6-50 MG PO TABS
2.0000 | ORAL_TABLET | ORAL | Status: DC
  Administered 2019-02-26 (×2): 2 via ORAL
  Filled 2019-02-25 (×2): qty 2

## 2019-02-25 NOTE — Progress Notes (Signed)
  Labor Progress Note   23 y.o. G2P1001 @ [redacted]w[redacted]d , admitted for  Pregnancy, Labor Management.   Subjective:  Patient is comfortable with epidural.  Objective:  BP 116/64   Pulse (!) 57   Temp 98.6 F (37 C) (Oral)   Resp 16   Ht 5' (1.524 m)   Wt 83 kg   LMP 05/31/2018   SpO2 98%   BMI 35.74 kg/m  Abd: gravid, ND, FHT present, mild tenderness on exam Extr: trace edema SVE: CERVIX: 3.5 cm dilated, 60 effaced, -2 station AROM: clear fluid, cervical sweep IUPC placed  EFM: FHR: 135 bpm, variability: moderate,  accelerations:  Present,  decelerations:  Absent Toco: difficult to trace on side Labs: I have reviewed the patient's lab results.   Assessment & Plan:  G2P1001 @ [redacted]w[redacted]d, admitted for  Pregnancy and Labor/Delivery Management  1. Pain management: epidural. 2. FWB: FHT category I.  3. ID: GBS negative 4. Labor management: s/p AROM/clear and IUPC placement, continue titrating pitocin  All discussed with patient, see orders  Rod Can, [facility] 02/25/2019  9:18 AM

## 2019-02-25 NOTE — Discharge Summary (Signed)
OB Discharge Summary     Patient Name: Debra Hinton DOB: 16-Dec-1995 MRN: [id number]  Date of admission: 02/23/2019 Delivering provider: Tresea Mall, CNM  Date of Delivery: 02/25/2019  Date of discharge: 02/27/2019  Admitting diagnosis: IUP at 37 weeks, Induction of labor Intrauterine pregnancy: [redacted]w[redacted]d     Secondary diagnosis: IUGR with elevated umbilical artery doppler     Discharge diagnosis: Term Pregnancy Delivered                                                                                                Post partum procedures: none  Augmentation: AROM, Pitocin and Cytotec  Complications: None  Hospital course:  Induction of Labor With Vaginal Delivery   23 y.o. yo [id number] at [redacted]w[redacted]d was admitted to the hospital 02/23/2019 for induction of labor.  Indication for induction: intrauterine growth restriction with elevated umbilical artery dopplers.  Patient had an uncomplicated labor course as follows: Membrane Rupture Time/Date: 9:03 AM ,02/25/2019   Patient had delivery of viable female 3:18 PM, 02/25/2019 Details of delivery can be found in separate delivery note.    Patient had a routine postpartum course. She is tolerating regular diet. Her pain is controlled with PO medication. She is ambulating and voiding without difficulty. She reports breastfeeding is going well.    Patient is discharged home 02/27/19.  Physical exam  Vitals:   02/26/19 0819 02/26/19 1533 02/26/19 2341 02/27/19 0814  BP: 124/75 121/80 114/77 133/88  Pulse: (!) 51 64 65 60  Resp: 16 14 18 20   Temp: 97.8 F (36.6 C) 97.6 F (36.4 C) 98.2 F (36.8 C) 97.7 F (36.5 C)  TempSrc: Oral Oral Oral Oral  SpO2: 99% 99% 98% 100%  Weight:      Height:       General: alert, cooperative and no distress Lochia: appropriate Uterine Fundus: firm Incision: N/A DVT Evaluation: No evidence of DVT seen on physical exam.  Labs: Lab Results  Component Value Date   WBC 13.0 (H) 02/26/2019   HGB 11.5 (L)  02/26/2019   HCT 34.2 (L) 02/26/2019   MCV 87.5 02/26/2019   PLT 162 02/26/2019    Discharge instruction: per After Visit Summary.  Medications:  Allergies as of 02/27/2019   No Known Allergies     Medication List    STOP taking these medications   ondansetron 4 MG disintegrating tablet Commonly known as: Zofran ODT   sucralfate 1 g tablet Commonly known as: Carafate     TAKE these medications   multivitamin Chew chewable tablet Chew 2 tablets by mouth daily.   norethindrone 0.35 MG tablet Commonly known as: MICRONOR Take 1 tablet (0.35 mg total) by mouth daily. Start taking on: March 18, 2019       Diet: routine diet  Activity: Advance as tolerated. Pelvic rest for 6 weeks.   Outpatient follow up: Follow-up Information    March 20, 2019, CNM. Schedule an appointment as soon as possible for a visit in 6 week(s).   Specialty: Obstetrics Contact information: [address] [place] [phone]  Postpartum contraception: progesterone-only pill Rhogam Given postpartum: NA Rubella vaccine given postpartum: Immune Varicella vaccine given postpartum: Immune TDaP given antepartum or postpartum: given antepartum  Newborn Data: Live born female  Birth Weight: 5 lb 4 oz (2380 g) APGAR: 7, 8  Newborn Delivery   Birth date/time: 02/25/2019 15:18:00 Delivery type: Vaginal, Spontaneous       Baby Feeding: Breast  Disposition:home with mother  SIGNED:  Rod Can, CNM 02/27/2019 10:47 AM

## 2019-02-25 NOTE — Anesthesia Procedure Notes (Signed)
Epidural Patient location during procedure: OB Start time: 02/25/2019 3:10 AM End time: 02/25/2019 3:17 AM  Staffing Anesthesiologist: Cheo Selvey, Precious Haws, MD Performed: anesthesiologist   Preanesthetic Checklist Completed: patient identified, site marked, surgical consent, pre-op evaluation, timeout performed, IV checked, risks and benefits discussed and monitors and equipment checked  Epidural Patient position: sitting Prep: ChloraPrep Patient monitoring: heart rate, continuous pulse ox and blood pressure Approach: midline Location: L3-L4 Injection technique: LOR saline  Needle:  Needle type: Tuohy  Needle gauge: 17 G Needle length: 9 cm and 9 Needle insertion depth: 7 cm Catheter type: closed end flexible Catheter size: 19 Gauge Catheter at skin depth: 12 cm Test dose: negative and 1.5% lidocaine with Epi 1:200 K  Assessment Sensory level: T10 Events: blood not aspirated, injection not painful, no injection resistance, negative IV test and no paresthesia  Additional Notes 1 attempt Pt. Evaluated and documentation done after procedure finished. Patient identified. Risks/Benefits/Options discussed with patient including but not limited to bleeding, infection, nerve damage, paralysis, failed block, incomplete pain control, headache, blood pressure changes, nausea, vomiting, reactions to medication both or allergic, itching and postpartum back pain. Confirmed with bedside nurse the patient's most recent platelet count. Confirmed with patient that they are not currently taking any anticoagulation, have any bleeding history or any family history of bleeding disorders. Patient expressed understanding and wished to proceed. All questions were answered. Sterile technique was used throughout the entire procedure. Please see nursing notes for vital signs. Test dose was given through epidural catheter and negative prior to continuing to dose epidural or start infusion. Warning signs of  high block given to the patient including shortness of breath, tingling/numbness in hands, complete motor block, or any concerning symptoms with instructions to call for help. Patient was given instructions on fall risk and not to get out of bed. All questions and concerns addressed with instructions to call with any issues or inadequate analgesia.   Patient tolerated the insertion well without immediate complications.Reason for block:procedure for pain

## 2019-02-25 NOTE — Anesthesia Preprocedure Evaluation (Signed)
Anesthesia Evaluation  Patient identified by MRN, date of birth, ID band Patient awake    Reviewed: Allergy & Precautions, H&P , NPO status , Patient's Chart, lab work & pertinent test results  History of Anesthesia Complications Negative for: history of anesthetic complications  Airway Mallampati: III  TM Distance: <3 FB Neck ROM: full    Dental  (+) Chipped   Pulmonary neg pulmonary ROS, former smoker,           Cardiovascular Exercise Tolerance: Good (-) hypertensionnegative cardio ROS       Neuro/Psych PSYCHIATRIC DISORDERS    GI/Hepatic GERD  Medicated and Controlled,  Endo/Other  Hyperthyroidism   Renal/GU   negative genitourinary   Musculoskeletal   Abdominal   Peds  Hematology negative hematology ROS (+)   Anesthesia Other Findings Past Medical History: No date: Anxiety No date: Anxiety and depression No date: Depressive disorder No date: GERD (gastroesophageal reflux disease) 2019: Hyperthyroidism     Comment:  Possible Graves disease No date: IBS (irritable bowel syndrome) No date: Irritable bowel syndrome (IBS) No date: Thyroid disease  Past Surgical History: No date: NO PAST SURGERIES  BMI    Body Mass Index: 35.74 kg/m      Reproductive/Obstetrics (+) Pregnancy                             Anesthesia Physical Anesthesia Plan  ASA: III  Anesthesia Plan: Epidural   Post-op Pain Management:    Induction:   PONV Risk Score and Plan:   Airway Management Planned:   Additional Equipment:   Intra-op Plan:   Post-operative Plan:   Informed Consent: I have reviewed the patients History and Physical, chart, labs and discussed the procedure including the risks, benefits and alternatives for the proposed anesthesia with the patient or authorized representative who has indicated his/her understanding and acceptance.       Plan Discussed with:  Anesthesiologist  Anesthesia Plan Comments:         Anesthesia Quick Evaluation

## 2019-02-25 NOTE — Anesthesia Procedure Notes (Signed)
Epidural Patient location during procedure: OB Start time: 02/25/2019 2:11 PM End time: 02/25/2019 2:16 PM  Staffing Anesthesiologist: Martha Clan, MD Performed: anesthesiologist   Preanesthetic Checklist Completed: patient identified, site marked, surgical consent, pre-op evaluation, timeout performed, IV checked, risks and benefits discussed and monitors and equipment checked  Epidural Patient position: sitting Prep: ChloraPrep Patient monitoring: heart rate, continuous pulse ox and blood pressure Approach: midline Location: L2-L3 Injection technique: LOR saline  Needle:  Needle type: Tuohy  Needle gauge: 17 G Needle length: 9 cm and 9 Needle insertion depth: 5 cm Catheter type: closed end flexible Catheter size: 19 Gauge Catheter at skin depth: 10 cm Test dose: negative and 1.5% lidocaine with Epi 1:200 K  Assessment Sensory level: T10 Events: blood not aspirated, injection not painful, no injection resistance, negative IV test and no paresthesia  Additional Notes Second attempt Pt. Evaluated and documentation done after procedure finished. Patient identified. Risks/Benefits/Options discussed with patient including but not limited to bleeding, infection, nerve damage, paralysis, failed block, incomplete pain control, headache, blood pressure changes, nausea, vomiting, reactions to medication both or allergic, itching and postpartum back pain. Confirmed with bedside nurse the patient's most recent platelet count. Confirmed with patient that they are not currently taking any anticoagulation, have any bleeding history or any family history of bleeding disorders. Patient expressed understanding and wished to proceed. All questions were answered. Sterile technique was used throughout the entire procedure. Please see nursing notes for vital signs. Test dose was given through epidural catheter and negative prior to continuing to dose epidural or start infusion. Warning signs of high  block given to the patient including shortness of breath, tingling/numbness in hands, complete motor block, or any concerning symptoms with instructions to call for help. Patient was given instructions on fall risk and not to get out of bed. All questions and concerns addressed with instructions to call with any issues or inadequate analgesia.   Patient tolerated the insertion well without immediate complications.Reason for block:procedure for pain

## 2019-02-26 LAB — CBC
HCT: 34.2 % — ABNORMAL LOW (ref 36.0–46.0)
Hemoglobin: 11.5 g/dL — ABNORMAL LOW (ref 12.0–15.0)
MCH: 29.4 pg (ref 26.0–34.0)
MCHC: 33.6 g/dL (ref 30.0–36.0)
MCV: 87.5 fL (ref 80.0–100.0)
Platelets: 162 10*3/uL (ref 150–400)
RBC: 3.91 MIL/uL (ref 3.87–5.11)
RDW: 12.9 % (ref 11.5–15.5)
WBC: 13 10*3/uL — ABNORMAL HIGH (ref 4.0–10.5)
nRBC: 0 % (ref 0.0–0.2)

## 2019-02-26 NOTE — Progress Notes (Signed)
Post Partum Day 1: IOL for IUGR with elevated Dopplers Subjective: up ad lib, voiding, tolerating PO and breast feeding  Objective: Blood pressure 124/75, pulse (!) 51, temperature 97.8 F (36.6 C), temperature source Oral, resp. rate 16, height 5' (1.524 m), weight 83 kg, last menstrual period 05/31/2018, SpO2 99 %, unknown if currently breastfeeding.  Physical Exam:  General: alert, cooperative, fatigued and no distress Lochia: appropriate Uterine Fundus: firm/ U to U-1/ ML/ NT  DVT Evaluation: No evidence of DVT seen on physical exam.  Recent Labs    02/23/19 2100 02/26/19 0514  HGB 12.8 11.5*  HCT 37.6 34.2*  WBC 8.2 13.0*  PLT 211 162    Assessment/Plan: Stable PPD #1-continue postpartum care and support breast feeding Discharge tomorrow. Discuss starting anxiolytic prophyllactically prior to discharge Continue vitamins with iron O POS/ RI/ VI TDAP-given 01/19/2019 Breast/ minipill   LOS: 3 days   Dalia Heading 02/26/2019, 11:47 AM

## 2019-02-26 NOTE — Anesthesia Postprocedure Evaluation (Signed)
Anesthesia Post Note  Patient: Debra Hinton  Procedure(s) Performed: AN AD [place]  Patient location during evaluation: Mother Baby Anesthesia Type: Epidural Level of consciousness: awake, awake and alert and patient cooperative Pain management: pain level controlled Vital Signs Assessment: post-procedure vital signs reviewed and stable Respiratory status: spontaneous breathing, nonlabored ventilation and respiratory function stable Cardiovascular status: stable Postop Assessment: no headache, no backache, patient able to bend at knees, able to ambulate, adequate PO intake and no apparent nausea or vomiting Anesthetic complications: no     Last Vitals:  Vitals:   02/26/19 0309 02/26/19 0819  BP: 130/84 124/75  Pulse: 64 (!) 51  Resp: 18 16  Temp: 36.6 C 36.6 C  SpO2: 100% 99%    Last Pain:  Vitals:   02/26/19 0935  TempSrc:   PainSc: 0-No pain                 Kiante Petrovich,  Kye Silverstein R

## 2019-02-26 NOTE — Lactation Note (Signed)
This note was copied from a baby's chart. Lactation Consultation Note  Patient Name: Debra Hinton [id number] Date: 02/26/2019 Reason for consult: Initial assessment  LC checked in with mom/dad of baby Debra Hinton this morning. Mom was in the shower; [place] spoke with dad. [name] has had trouble stabilizing her blood sugar, and has been given 24kcal supplement. Current plan is to breastfeed at next feed, without supplement, and re-check sugar prior to following feed. Parents in understanding. LC will return to check on mom and Debra Hinton shortly after mom finishes her shower.  Maternal Data    Feeding Feeding Type: Breast Fed  LATCH Score                   Interventions    Lactation Tools Discussed/Used     Consult Status Consult Status: Follow-up Date: 02/26/19 Follow-up type: In-patient    [name] 02/26/2019, 10:12 AM

## 2019-02-26 NOTE — Lactation Note (Signed)
This note was copied from a baby's chart. Lactation Consultation Note  Patient Name: Debra Hinton [id number] Date: 02/26/2019 Reason for consult: Follow-up assessment   Maternal Data    Feeding Feeding Type: Breast Fed  LATCH Score Latch: Repeated attempts needed to sustain latch, nipple held in mouth throughout feeding, stimulation needed to elicit sucking reflex.  Audible Swallowing: Spontaneous and intermittent  Type of Nipple: Everted at rest and after stimulation  Comfort (Breast/Nipple): Soft / non-tender  Hold (Positioning): Assistance needed to correctly position infant at breast and maintain latch.  LATCH Score: 8  Interventions   LC intern was called to assist with next feed.  Mom was ready to breastfeed, according to baby's feeding cues.  [facility] intern repositioned her breastfeeding pillow to support baby in a football hold.  Mom insisted on the use of a nipple shield since she used it with her first.  Her reasoning is to avoid any nipple pain.  Baby was sucking only on the nipple and not obtaining a deep latch.  Parents were shown what a deep latch should look like with the use of an artificial nipple: baby's lips need to be at the rim, or at the base of the shield, to get as much tissue in baby's mouth as possible.  After this demo and adjustment, baby nursed more vigorously, audible swallows readily heard.  [facility] intern also encouraged parents to watch baby's hands for hunger status.  [facility] intern also informed them of the protein composition difference between formula and breastmilk, explaining that formula keeps baby full for longer periods of time.  At this point, they were should baby belly balls to demonstrate how much baby may be eating at each feed.  Parents were instructed to rest during the second 12 hours of life since baby's are typically more tired before clusterfeeding at 24 hours.  They were receptive to the breastfeeding education and plan on calling lactation and  their nurse to check sugars before their next feed.  Lactation Tools Discussed/Used Tools: Nipple Shields(patient brought shield; used with first)   Consult Status Consult Status: Follow-up Date: 02/26/19 Follow-up type: In-patient    [name] 02/26/2019, 10:58 AM

## 2019-02-26 NOTE — Lactation Note (Signed)
This note was copied from a baby's chart. Lactation Consultation Note  Patient Name: Debra Hinton [id number] Date: 02/26/2019 Reason for consult: Follow-up assessment  LC stepped in just as mom was finishing a feed with use of nipple shield.  Mom reported feeding for almost 30 minutes, and baby starting to let go.  Mom plans to continue use of nipple shield to prevent onset of sore nipples until no longer need. Has experience weaning previous baby from use nipple shield. No longer providing supplement that was needed for blood sugar. Mom plans to continue with only breastfeeding at this time. Reviewed with mom signs of fullness, newborn onset of cluster feeding, and encouraged continuing to put baby to breast with cues. Mom acknowledges and understands.   Maternal Data Does the patient have breastfeeding experience prior to this delivery?: Yes  Feeding Feeding Type: Breast Fed  LATCH Score                   Interventions Interventions: Breast feeding basics reviewed  Lactation Tools Discussed/Used     Consult Status Consult Status: Follow-up Date: 02/26/19 Follow-up type: In-patient    Debra Hinton 02/26/2019, 4:21 PM

## 2019-02-27 ENCOUNTER — Encounter: Payer: Commercial Managed Care - PPO | Admitting: Obstetrics and Gynecology

## 2019-02-27 MED ORDER — NORETHINDRONE 0.35 MG PO TABS: 1.0000 | ORAL_TABLET | Freq: Every day | ORAL | 4 refills | Status: DC

## 2019-02-27 NOTE — Discharge Instructions (Signed)

## 2019-02-27 NOTE — Progress Notes (Signed)
Discharge order received from doctor. Reviewed discharge instructions and prescriptions with patient and answered all questions. Follow up appointment instructions given. Patient verbalized understanding. ID bands checked. Patient discharged home with infant via wheelchair by nursing/auxillary.    Suvan Stcyr Garner, RN  

## 2019-02-27 NOTE — Lactation Note (Signed)
This note was copied from a baby's chart. Lactation Consultation Note  Patient Name: Debra Hinton [id number] Date: 02/27/2019   [facility] checked in with mom and baby [name]. Mom reports breastfeeding overnight went well. Continues to use nipple shield as a precaution to prevent potential nipple damage. LC has previously provided education about use of nipple shield if not necessary, mom understands and plans to continue use "for now".  [name] was actively nursing in football position on left breast. Good visible jaw movement, relaxed hands/arms positioned around the breast, and baby in good alignment. Reviewed breastfeeding basics: newborn feeding patterns, growth spurts/cluster feeding, emptying of the breast with use of compressions and massage while nursing. Provided guidance on hunger cues vs fullness cues. Parents acknowledge information. LC gave information for outpatient lactation consults available through hospital after discharge, and encouraged engagement in virtual breastfeeding support group.  Maternal Data Formula Feeding for Exclusion: No  Feeding Feeding Type: Breast Fed  LATCH Score                   Interventions    Lactation Tools Discussed/Used Tools: Nipple Shields(patient preference)   Consult Status      [name] 02/27/2019, 12:09 PM

## 2019-02-27 NOTE — Progress Notes (Signed)
Sorry I didn't do that- NST documentation is now done.

## 2019-02-27 NOTE — Progress Notes (Signed)
I added the NST charges.

## 2019-02-27 NOTE — Progress Notes (Signed)
Infant CPR video watched by parents at this time. All questions answered.    Hilbert Bible, RN

## 2019-02-28 LAB — SURGICAL PATHOLOGY

## 2019-03-02 ENCOUNTER — Other Ambulatory Visit: Payer: Commercial Managed Care - PPO

## 2019-03-02 ENCOUNTER — Encounter: Payer: Commercial Managed Care - PPO | Admitting: Obstetrics & Gynecology

## 2019-03-06 ENCOUNTER — Encounter: Payer: Commercial Managed Care - PPO | Admitting: Obstetrics and Gynecology

## 2019-04-17 ENCOUNTER — Ambulatory Visit (INDEPENDENT_AMBULATORY_CARE_PROVIDER_SITE_OTHER): Payer: Commercial Managed Care - PPO | Admitting: Advanced Practice Midwife

## 2019-04-17 ENCOUNTER — Encounter: Payer: Self-pay | Admitting: Advanced Practice Midwife

## 2019-04-17 ENCOUNTER — Other Ambulatory Visit: Payer: Self-pay

## 2019-04-17 DIAGNOSIS — Z1389 Encounter for screening for other disorder: Secondary | ICD-10-CM | POA: Diagnosis not present

## 2019-04-17 DIAGNOSIS — F418 Other specified anxiety disorders: Secondary | ICD-10-CM

## 2019-04-17 DIAGNOSIS — O99345 Other mental disorders complicating the puerperium: Secondary | ICD-10-CM

## 2019-04-17 MED ORDER — SERTRALINE HCL 50 MG PO TABS: 50.0000 mg | ORAL_TABLET | Freq: Every day | ORAL | 11 refills | Status: DC

## 2019-04-17 NOTE — Progress Notes (Signed)
Postpartum Visit  Chief Complaint:  Chief Complaint  Patient presents with  . Postpartum Care    History of Present Illness: Patient is a 23 y.o. K2H0623 presents for postpartum visit.  Review the Delivery Report for details.  Date of delivery: 02/25/2019 Type of delivery: Vaginal delivery - Vacuum or forceps assisted  no Episiotomy No.  Laceration: superficial bilateral vaginal abrasions  Pregnancy or labor problems:  IUGR diagnosed just prior to induction, placenta sticky with eventual spontaneous delivery Any problems since the delivery: no  Newborn Details:  SINGLETON :  1. BabyGender female [place]. Birth weight: 5 pounds 4 ounces Maternal Details:  Breast or formula feeding: breastfeeding Intercourse: No  Contraception after delivery: Yes progesterone only pill Any bowel or bladder issues: No  Post partum depression/anxiety noted:  Yes- anxiety level has increased Edinburgh Post-Partum Depression Score: 9 Date of last PAP: 05/31/2018  no abnormalities   Review of Systems: Review of Systems  Constitutional: Negative.   HENT: Negative.   Eyes: Negative.   Respiratory: Negative.   Cardiovascular: Negative.   Gastrointestinal: Negative.   Genitourinary: Negative.   Musculoskeletal: Negative.   Skin: Negative.   Neurological: Negative.   Endo/Heme/Allergies: Negative.   Psychiatric/Behavioral:       Anxiety      Past Medical History:  Past Medical History:  Diagnosis Date  . Anxiety   . Anxiety and depression   . Depressive disorder   . GERD (gastroesophageal reflux disease)   . Hyperthyroidism 2019   Possible Graves disease  . IBS (irritable bowel syndrome)   . Irritable bowel syndrome (IBS)   . Thyroid disease     Past Surgical History:  Past Surgical History:  Procedure Laterality Date  . NO PAST SURGERIES      Family History:  Family History  Problem Relation Age of Onset  . Breast cancer Other         mggm  . Hypertension Mother   .  Hyperlipidemia Father   . Thyroid disease Maternal Grandmother   . Dementia Maternal Grandmother   . Heart attack Maternal Grandfather   . Diabetes Paternal Grandmother   . Alcoholism Paternal Grandmother   . Cancer Paternal Grandfather        pancreatic  . Healthy Brother   . Hypothyroidism Maternal Aunt   . Hypothyroidism Maternal Aunt   . Breast cancer Maternal Aunt        30s    Social History:  Social History   Socioeconomic History  . Marital status: Single    Spouse name: Larkin Ina  . Number of children: 1  . Years of education: Not on file  . Highest education level: Not on file  Occupational History  . Not on file  Social Needs  . Financial resource strain: Not on file  . Food insecurity    Worry: Not on file    Inability: Not on file  . Transportation needs    Medical: Not on file    Non-medical: Not on file  Tobacco Use  . Smoking status: Former Smoker    Quit date: 07/03/2018    Years since quitting: 0.7  . Smokeless tobacco: Never Used  Substance and Sexual Activity  . Alcohol use: Yes    Comment: 2 drinks/month  . Drug use: Never    Types: Marijuana  . Sexual activity: Yes    Birth control/protection: None    Comment: pt is trying to conceive  Lifestyle  . Physical activity  Days per week: Not on file    Minutes per session: Not on file  . Stress: Not on file  Relationships  . Social Musician on phone: Not on file    Gets together: Not on file    Attends religious service: Not on file    Active member of club or organization: Not on file    Attends meetings of clubs or organizations: Not on file    Relationship status: Not on file  . Intimate partner violence    Fear of current or ex partner: Not on file    Emotionally abused: Not on file    Physically abused: Not on file    Forced sexual activity: Not on file  Other Topics Concern  . Not on file  Social History Narrative   ** Merged History Encounter **        Allergies:   No Known Allergies  Medications: Prior to Admission medications   Medication Sig Start Date End Date Taking? Authorizing Provider  multivitamin (VIT W/EXTRA C) CHEW chewable tablet Chew 2 tablets by mouth daily.   Yes [provider]  norethindrone (MICRONOR) 0.35 MG tablet Take 1 tablet (0.35 mg total) by mouth daily. 03/18/19  Yes Tresea Mall, CNM  sertraline (ZOLOFT) 50 MG tablet Take 1 tablet (50 mg total) by mouth daily. 04/17/19   Tresea Mall, CNM    Physical Exam Blood pressure 100/60, height 5' (1.524 m), weight 178 lb (80.7 kg), last menstrual period 04/07/2019, currently breastfeeding.    General: NAD HEENT: normocephalic, anicteric Pulmonary: No increased work of breathing Abdomen: NABS, soft, non-tender, non-distended.  Umbilicus without lesions.  No hepatomegaly, splenomegaly or masses palpable. No evidence of hernia. Genitourinary:  External: Normal external female genitalia.  Normal urethral meatus, normal  Bartholin's and Skene's glands.    Vagina: Normal vaginal mucosa, no evidence of prolapse.    Cervix: Grossly normal in appearance, no bleeding  Uterus: Non-enlarged, mobile, normal contour.  No CMT  Adnexa: ovaries non-enlarged, no adnexal masses  Rectal: deferred Extremities: no edema, erythema, or tenderness Neurologic: Grossly intact Psychiatric: mood appropriate, affect full  Edinburgh Postnatal Depression Scale - 04/17/19 1500      Edinburgh Postnatal Depression Scale:  In the Past 7 Days   I have been able to laugh and see the funny side of things.  0    I have looked forward with enjoyment to things.  0    I have blamed myself unnecessarily when things went wrong.  2    I have been anxious or worried for no good reason.  2    I have felt scared or panicky for no good reason.  2    Things have been getting on top of me.  1    I have been so unhappy that I have had difficulty sleeping.  0    I have felt sad or miserable.  1    I have  been so unhappy that I have been crying.  1    The thought of harming myself has occurred to me.  0    Edinburgh Postnatal Depression Scale Total  9       Assessment: 23 y.o. [id number] presenting for 6 week postpartum visit  Plan: Problem List Items Addressed This Visit    None    Visit Diagnoses    6 weeks postpartum follow-up    -  Primary   Postpartum anxiety  Relevant Medications   sertraline (ZOLOFT) 50 MG tablet       1) Contraception - Education given regarding options for contraception, as well as compatibility with breast feeding if applicable.  Patient plans on oral progesterone-only contraceptive for contraception.  2)  Pap - ASCCP guidelines and rational discussed.  ASCCP guidelines and rational discussed.  Patient opts for every 3 years screening interval  3) Patient underwent screening for postpartum depression with no signs of depression  RX zoloft for signs of anxiety  4) Return in about 1 year (around 04/16/2020).   Tresea MallJane Paulanthony Gleaves, CNM [facility] 04/17/2019, 4:57 PM

## 2019-10-17 ENCOUNTER — Other Ambulatory Visit: Payer: Self-pay

## 2019-10-17 ENCOUNTER — Ambulatory Visit: Payer: Commercial Managed Care - PPO | Admitting: Adult Health

## 2019-10-17 ENCOUNTER — Encounter: Payer: Self-pay | Admitting: Adult Health

## 2019-10-17 VITALS — Temp 98.9°F | Resp 16 | Ht 60.0 in | Wt 165.0 lb

## 2019-10-17 DIAGNOSIS — R059 Cough, unspecified: Secondary | ICD-10-CM

## 2019-10-17 DIAGNOSIS — J0100 Acute maxillary sinusitis, unspecified: Secondary | ICD-10-CM

## 2019-10-17 DIAGNOSIS — R05 Cough: Secondary | ICD-10-CM | POA: Diagnosis not present

## 2019-10-17 MED ORDER — AMOXICILLIN-POT CLAVULANATE 875-125 MG PO TABS: 1.0000 | ORAL_TABLET | Freq: Two times a day (BID) | ORAL | 0 refills | Status: DC

## 2019-10-17 NOTE — Progress Notes (Signed)
[facility] [address] [place], [place] [address]  Internal MEDICINE  Telephone Visit  Patient Name: Debra Hinton  [id number]  [id number]  Date of Service: 11/03/2019  I connected with the patient at 1226 by telephone and verified the patients identity using two identifiers.   I discussed the limitations, risks, security and privacy concerns of performing an evaluation and management service by telephone and the availability of in person appointments. I also discussed with the patient that there may be a patient responsible charge related to the service.  The patient expressed understanding and agrees to proceed.    Chief Complaint  Patient presents with  . Telephone Assessment    sinus infection, sneezing , green mucus from nose,coughing   . Telephone Screen    breast feeding    HPI  Pt is seen via telephone. She reports 5 days of sinus drainage, PND, sinus pain and pressure.  She has taken OTC sinus medications.  She woke up this morning with chest congestion, and cough.  She has not been around any known covid patients.  She has not been vaccinated.       Current Medication: Outpatient Encounter Medications as of 10/17/2019  Medication Sig  . multivitamin (VIT W/EXTRA C) CHEW chewable tablet Chew 2 tablets by mouth daily.  . norethindrone (MICRONOR) 0.35 MG tablet Take 1 tablet (0.35 mg total) by mouth daily.  . sertraline (ZOLOFT) 50 MG tablet Take 1 tablet (50 mg total) by mouth daily.  Marland Kitchen amoxicillin-clavulanate (AUGMENTIN) 875-125 MG tablet Take 1 tablet by mouth 2 (two) times daily.   No facility-administered encounter medications on file as of 10/17/2019.    Surgical History: Past Surgical History:  Procedure Laterality Date  . NO PAST SURGERIES      Medical History: Past Medical History:  Diagnosis Date  . Anxiety   . Anxiety and depression   . Depressive disorder   . GERD (gastroesophageal reflux disease)   . Hyperthyroidism 2019    Possible Graves disease  . IBS (irritable bowel syndrome)   . Irritable bowel syndrome (IBS)   . Thyroid disease     Family History: Family History  Problem Relation Age of Onset  . Breast cancer Other         mggm  . Hypertension Mother   . Hyperlipidemia Father   . Thyroid disease Maternal Grandmother   . Dementia Maternal Grandmother   . Heart attack Maternal Grandfather   . Diabetes Paternal Grandmother   . Alcoholism Paternal Grandmother   . Cancer Paternal Grandfather        pancreatic  . Healthy Brother   . Hypothyroidism Maternal Aunt   . Hypothyroidism Maternal Aunt   . Breast cancer Maternal Aunt        30s    Social History   Socioeconomic History  . Marital status: Single    Spouse name: Debra Hinton  . Number of children: 1  . Years of education: Not on file  . Highest education level: Not on file  Occupational History  . Not on file  Tobacco Use  . Smoking status: Former Smoker    Quit date: 07/03/2018    Years since quitting: 1.3  . Smokeless tobacco: Never Used  Vaping Use  . Vaping Use: Some days  Substance and Sexual Activity  . Alcohol use: Not Currently    Comment: 2 drinks/month  . Drug use: Never    Types: Marijuana  . Sexual activity: Yes  Birth control/protection: None    Comment: pt is trying to conceive  Other Topics Concern  . Not on file  Social History Narrative   ** Merged History Encounter **       Social Determinants of Health   Financial Resource Strain:   . Difficulty of Paying Living Expenses:   Food Insecurity:   . Worried About Charity fundraiser in the Last Year:   . Arboriculturist in the Last Year:   Transportation Needs:   . Film/video editor (Medical):   Marland Kitchen Lack of Transportation (Non-Medical):   Physical Activity:   . Days of Exercise per Week:   . Minutes of Exercise per Session:   Stress:   . Feeling of Stress :   Social Connections:   . Frequency of Communication with Friends and Family:   .  Frequency of Social Gatherings with Friends and Family:   . Attends Religious Services:   . Active Member of Clubs or Organizations:   . Attends Archivist Meetings:   Marland Kitchen Marital Status:   Intimate Partner Violence:   . Fear of Current or Ex-Partner:   . Emotionally Abused:   Marland Kitchen Physically Abused:   . Sexually Abused:       Review of Systems  Constitutional: Negative for chills, fatigue and unexpected weight change.  HENT: Negative for congestion, rhinorrhea, sneezing and sore throat.   Eyes: Negative for photophobia, pain and redness.  Respiratory: Negative for cough, chest tightness and shortness of breath.   Cardiovascular: Negative for chest pain and palpitations.  Gastrointestinal: Negative for abdominal pain, constipation, diarrhea, nausea and vomiting.  Endocrine: Negative.   Genitourinary: Negative for dysuria and frequency.  Musculoskeletal: Negative for arthralgias, back pain, joint swelling and neck pain.  Skin: Negative for rash.  Allergic/Immunologic: Negative.   Neurological: Negative for tremors and numbness.  Hematological: Negative for adenopathy. Does not bruise/bleed easily.  Psychiatric/Behavioral: Negative for behavioral problems and sleep disturbance. The patient is not nervous/anxious.     Vital Signs: Temp 98.9 F (37.2 C)   Resp 16   Ht 5' (1.524 m)   Wt 165 lb (74.8 kg)   BMI 32.22 kg/m    Observation/Objective:  Well sounding, NAD noted.    Assessment/Plan: 1. Acute non-recurrent maxillary sinusitis Advised patient to take entire course of antibiotics as prescribed with food. Pt should return to clinic in 7-10 days if symptoms fail to improve or new symptoms develop.  - amoxicillin-clavulanate (AUGMENTIN) 875-125 MG tablet; Take 1 tablet by mouth 2 (two) times daily.  Dispense: 14 tablet; Refill: 0  2. Cough Stable, continue present management with otc meds.   General Counseling: Debra Hinton verbalizes understanding of the findings of  today's phone visit and agrees with plan of treatment. I have discussed any further diagnostic evaluation that may be needed or ordered today. We also reviewed her medications today. she has been encouraged to call the office with any questions or concerns that should arise related to todays visit.    No orders of the defined types were placed in this encounter.   Meds ordered this encounter  Medications  . amoxicillin-clavulanate (AUGMENTIN) 875-125 MG tablet    Sig: Take 1 tablet by mouth 2 (two) times daily.    Dispense:  14 tablet    Refill:  0    Time spent: [place] AGNP-C Internal medicine

## 2019-10-24 ENCOUNTER — Other Ambulatory Visit: Payer: Self-pay

## 2019-10-24 MED ORDER — FLUCONAZOLE 150 MG PO TABS: ORAL_TABLET | ORAL | 0 refills | Status: DC

## 2019-10-24 NOTE — Telephone Encounter (Signed)
PATIENT WAS PRESCRIBED ABX AND DEVELOPED YEAST INFECTION. SENT IN DIFLUCAN.

## 2020-03-26 LAB — FETAL NONSTRESS TEST

## 2020-05-24 NOTE — L&D Delivery Note (Signed)
Delivery Note At 12:55 AM a viable female was delivered via Vaginal, Spontaneous (Presentation: LOA).  APGAR: 9, 9; weight  .   Placenta status: Spontaneous, Intact.  Cord:   without complications:  .  Cord pH: N/A  Anesthesia: Epidural Episiotomy: None Lacerations: None Suture Repair:  N/A Est. Blood Loss (mL):   Mom to postpartum.  Baby to Couplet care / Skin to Skin.  Vena Austria 04/25/2021, 1:19 AM

## 2020-06-11 ENCOUNTER — Telehealth: Payer: Self-pay

## 2020-06-11 NOTE — Telephone Encounter (Signed)
Request for medical consultation for dental procedure signed by provider and faxed to  [facility] at [phone]. Placed in scan.

## 2020-07-28 ENCOUNTER — Ambulatory Visit: Payer: Commercial Managed Care - PPO | Admitting: Physician Assistant

## 2020-08-07 ENCOUNTER — Other Ambulatory Visit: Payer: Self-pay

## 2020-08-07 ENCOUNTER — Ambulatory Visit: Payer: Commercial Managed Care - PPO | Admitting: Physician Assistant

## 2020-08-08 ENCOUNTER — Telehealth: Payer: Self-pay

## 2020-08-08 NOTE — Telephone Encounter (Signed)
Lmom to call and rescheduled appt. No show on 08-07-20  JS

## 2020-08-25 ENCOUNTER — Telehealth: Payer: Self-pay

## 2020-08-25 NOTE — Telephone Encounter (Signed)
Pt calling; has had a positive preg test; has some very light spotting yesterday; has anxiety and would like to have a Beta HCG drawn to be sure levels are where they need to be.  [phone]

## 2020-08-25 NOTE — Telephone Encounter (Signed)
Please schedule her a visit to be seen.

## 2020-08-26 ENCOUNTER — Encounter: Payer: Self-pay | Admitting: Obstetrics and Gynecology

## 2020-08-26 ENCOUNTER — Telehealth: Payer: Self-pay | Admitting: Certified Nurse Midwife

## 2020-08-26 ENCOUNTER — Ambulatory Visit: Payer: Commercial Managed Care - PPO | Admitting: Obstetrics & Gynecology

## 2020-08-26 ENCOUNTER — Other Ambulatory Visit: Payer: Self-pay

## 2020-08-26 ENCOUNTER — Ambulatory Visit (INDEPENDENT_AMBULATORY_CARE_PROVIDER_SITE_OTHER): Payer: Commercial Managed Care - PPO | Admitting: Obstetrics and Gynecology

## 2020-08-26 VITALS — BP 110/60 | Wt 162.0 lb

## 2020-08-26 DIAGNOSIS — N926 Irregular menstruation, unspecified: Secondary | ICD-10-CM

## 2020-08-26 DIAGNOSIS — O209 Hemorrhage in early pregnancy, unspecified: Secondary | ICD-10-CM | POA: Diagnosis not present

## 2020-08-26 DIAGNOSIS — N912 Amenorrhea, unspecified: Secondary | ICD-10-CM

## 2020-08-26 LAB — POCT URINE PREGNANCY: Preg Test, Ur: POSITIVE — AB

## 2020-08-26 NOTE — Telephone Encounter (Signed)
She is pregnant. This is her third pregnancy. She thinks she is around 4 weeks. She started spotting lightly on Sunday morning only when she wiped. Spotting resolved and started again this morning. She thinks it is just implantation bleeding. She has been a patient at [facility] previously.

## 2020-08-26 NOTE — Progress Notes (Signed)
Patient ID: Debra Hinton, female   DOB: [birth year], 25 y.o.   MRN: [id number]  Reason for Consult: Vaginal Bleeding (Spotting x 2 days when she wipes, + home preg test. RM 4)   Referred by No ref. provider found  Subjective:     HPI:  Debra Hinton is a 25 y.o. female who presents with concern regarding vaginal spotting. Patient is estimated to be [redacted] weeks pregnant based on her last menstrual period. She noted her first positive home pregnancy test on 08/16/20, with a faint line. Subsequently, she reports taking several positive home pregnancy tests with her first positive digital test occurring on 08/25/20. Today she reports light pink vaginal spotting that first occurred with wiping on Sunday (08/24/20). She again noted light pink spotting this morning with wiping. She reports IC this morning prior to this event. Patient states she has a high level of anxiety surrounding this pregnancy. She does reports several symptoms of pregnancy including constipation, frequent urination, a significant increased in appetite, and intermittent nausea (controlled with frequent meals). She denies any cramping or bleeding that saturated pad. Patient is currently unsure where she plans on receiving her OB care.  Gynecological History Menopause: n/a LMP: 07/29/20 Describes periods as regular Last pap smear: 05/31/18 Last Mammogram: n/a History of STDs: none Sexually Active: yes  Obstetrical History G3P2002  Past Medical History:  Diagnosis Date  . Anxiety   . Anxiety and depression   . Depressive disorder   . GERD (gastroesophageal reflux disease)   . Hyperthyroidism 2019   Possible Graves disease  . IBS (irritable bowel syndrome)   . Irritable bowel syndrome (IBS)   . Thyroid disease    Family History  Problem Relation Age of Onset  . Breast cancer Other         mggm  . Hypertension Mother   . Hyperlipidemia Father   . Thyroid disease Maternal Grandmother   . Dementia Maternal  Grandmother   . Heart attack Maternal Grandfather   . Diabetes Paternal Grandmother   . Alcoholism Paternal Grandmother   . Cancer Paternal Grandfather        pancreatic  . Healthy Brother   . Hypothyroidism Maternal Aunt   . Hypothyroidism Maternal Aunt   . Breast cancer Maternal Aunt        30 s   Past Surgical History:  Procedure Laterality Date  . NO PAST SURGERIES      Short Social History:  Social History   Tobacco Use  . Smoking status: Former Smoker    Quit date: 07/03/2018    Years since quitting: 2.1  . Smokeless tobacco: Never Used  Substance Use Topics  . Alcohol use: Not Currently    Comment: 2 drinks/month    No Known Allergies  Current Outpatient Medications  Medication Sig Dispense Refill  . amoxicillin-clavulanate (AUGMENTIN) 875-125 MG tablet Take 1 tablet by mouth 2 (two) times daily. (Patient not taking: Reported on 08/26/2020) 14 tablet 0  . fluconazole (DIFLUCAN) 150 MG tablet TAKE 1 TAB PO ONCE. MAY REPEAT IN 3 DAYS IF SYMPTOMS PERSIST. (Patient not taking: Reported on 08/26/2020) 3 tablet 0  . multivitamin (VIT W/EXTRA C) CHEW chewable tablet Chew 2 tablets by mouth daily.    . norethindrone (MICRONOR) 0.35 MG tablet Take 1 tablet (0.35 mg total) by mouth daily. (Patient not taking: Reported on 08/26/2020) 3 Package 4  . sertraline (ZOLOFT) 50 MG tablet Take 1 tablet (50 mg total) by mouth daily. (Patient not taking:  Reported on 08/26/2020) 30 tablet 11   No current facility-administered medications for this visit.    Review of Systems  Constitutional: Positive for fatigue. Negative for chills, diaphoresis and fever.  HENT: HENT negative.  Eyes: Eyes negative.  Respiratory: Respiratory negative.  Cardiovascular: Cardiovascular negative.  GI: Positive for nausea. Negative for abdominal pain and vomiting.  GU: Positive for frequency. Negative for difficulty urinating.       Light pink spotting noted with wiping. Musculoskeletal: Musculoskeletal  negative.  Skin: Skin negative.  Neurological: Neurological negative. Hematologic: Hematologic/lymphatic negative.  Psychiatric: Psychiatric negative.        Objective:  Objective   Vitals:   08/26/20 1324  BP: 110/60  Weight: 162 lb (73.5 kg)   Body mass index is 31.64 kg/m.  Physical Exam Constitutional:      General: She is not in acute distress.    Appearance: Normal appearance. She is not ill-appearing.  HENT:     Head: Normocephalic.     Nose: Nose normal.  Pulmonary:     Effort: Pulmonary effort is normal.  Abdominal:     General: Abdomen is flat.     Palpations: Abdomen is soft.  Genitourinary:    Vagina: Vaginal discharge present.     Comments: External: Normal appearing vulva. No lesions noted.  Speculum examination: Normal appearing cervix. No blood in the vaginal vault. Scant amount of light pink discharge noted on the speculum.   Bimanual deferred   Musculoskeletal:        General: Normal range of motion.  Skin:    General: Skin is warm.  Neurological:     General: No focal deficit present.     Mental Status: She is alert and oriented to person, place, and time.  Psychiatric:        Mood and Affect: Mood normal.        Behavior: Behavior normal.     Assessment/Plan:     25 yo, G3P2002, with faintly positive UPT in office. Reassurance provided to patient regarding expectations for spotting in very early pregnancy (estimated [redacted] weeks gestational age), physical exam reassuring - no bleeding noted on exam. Discussed plan of care. Patient strongly desires quantitative HCG level. Discussed that if value is within expected range and no further S&S of bleeding or cramping then no indication for additional trends. Patient stated understanding.   Problem List Items Addressed This Visit   None   Visit Diagnoses    Amenorrhea    -  Primary   Relevant Orders   Beta hCG quant (ref lab)   US OB Comp Less 14 Wks   Missed period       Relevant Orders    POCT urine pregnancy (Completed)   Vaginal bleeding affecting early pregnancy       Relevant Orders   Beta hCG quant (ref lab)     Plan to follow-up with lab results.  Return in about 4 weeks (around 09/23/2020) for NOB - with dating [place].    Zipporah Plants, CNM [facility] 08/26/2020 2:12 PM

## 2020-08-26 NOTE — Telephone Encounter (Signed)
Patient was called. She said she was able to go to [facility] to be examined and have a beta-quant level. Everything checked out ok. She does plan on continuing to carry out her care with [facility].

## 2020-08-26 NOTE — Telephone Encounter (Signed)
I have never seen this patient- is she pregnant? How far along.   Thanks,  Pattricia Boss

## 2020-08-26 NOTE — Telephone Encounter (Signed)
Patient is scheduled for 08/26/20

## 2020-08-26 NOTE — Telephone Encounter (Signed)
Crystal, if it is just spotting then I agree it is most likely implantation bleeding. At this point I do not feel strongly that we need to do anything but if the bleeding increases, she begins to cramping that is worsening then we can do a beta quant level. If she would like to be seen we can see if she can come in tomorrow to discuss miscarriage precautions.   Thanks  Pattricia Boss

## 2020-08-27 LAB — BETA HCG QUANT (REF LAB): hCG Quant: 164 m[IU]/mL

## 2020-09-09 ENCOUNTER — Encounter: Payer: Self-pay | Admitting: Certified Nurse Midwife

## 2020-09-09 ENCOUNTER — Ambulatory Visit (INDEPENDENT_AMBULATORY_CARE_PROVIDER_SITE_OTHER): Payer: Commercial Managed Care - PPO | Admitting: Certified Nurse Midwife

## 2020-09-09 ENCOUNTER — Other Ambulatory Visit: Payer: Self-pay

## 2020-09-09 VITALS — BP 118/76 | HR 78 | Resp 16 | Ht 60.0 in | Wt 162.2 lb

## 2020-09-09 DIAGNOSIS — Z3200 Encounter for pregnancy test, result unknown: Secondary | ICD-10-CM

## 2020-09-09 LAB — POCT URINE PREGNANCY: Preg Test, Ur: POSITIVE — AB

## 2020-09-09 NOTE — Patient Instructions (Signed)
https://www.acog.org/womens-health/faqs/prenatal-genetic-screening-tests">  Prenatal Care Prenatal care is health care during pregnancy. It helps you and your unborn baby (fetus) stay as healthy as possible. Prenatal care may be provided by a midwife, a family practice doctor, a mid-level practitioner (nurse practitioner or physician assistant), or a childbirth and pregnancy doctor (obstetrician). How does this affect me? During pregnancy, you will be closely monitored for any new conditions that might develop. To lower your risk of pregnancy complications, you and your health care provider will talk about any underlying conditions you have. How does this affect my baby? Early and consistent prenatal care increases the chance that your baby will be healthy during pregnancy. Prenatal care lowers the risk that your baby will be:  Born early (prematurely).  Smaller than expected at birth (small for gestational age). What can I expect at the first prenatal care visit? Your first prenatal care visit will likely be the longest. You should schedule your first prenatal care visit as soon as you know that you are pregnant. Your first visit is a good time to talk about any questions or concerns you have about pregnancy. Medical history At your visit, you and your health care provider will talk about your medical history, including:  Any past pregnancies.  Your family's medical history.  Medical history of the baby's father.  Any long-term (chronic) health conditions you have and how you manage them.  Any surgeries or procedures you have had.  Any current over-the-counter or prescription medicines, herbs, or supplements that you are taking.  Other factors that could pose a risk to your baby, including: ? Exposure to harmful chemicals or radiation at work or at home. ? Any substance use, including tobacco, alcohol, and drug use.  Your home setting and your stress levels, including: ? Exposure to  abuse or violence. ? Household financial strain.  Your daily health habits, including diet and exercise. Tests and screenings Your health care provider will:  Measure your weight, height, and blood pressure.  Do a physical exam, including a pelvic and breast exam.  Perform blood tests and urine tests to check for: ? Urinary tract infection. ? Sexually transmitted infections (STIs). ? Low iron levels in your blood (anemia). ? Blood type and certain proteins on red blood cells (Rh antibodies). ? Infections and immunity to viruses, such as hepatitis B and rubella. ? HIV (human immunodeficiency virus).  Discuss your options for genetic screening. Tips about staying healthy Your health care provider will also give you information about how to keep yourself and your baby healthy, including:  Nutrition and taking vitamins.  Physical activity.  How to manage pregnancy symptoms such as nausea and vomiting (morning sickness).  Infections and substances that may be harmful to your baby and how to avoid them.  Food safety.  Dental care.  Working.  Travel.  Warning signs to watch for and when to call your health care provider. How often will I have prenatal care visits? After your first prenatal care visit, you will have regular visits throughout your pregnancy. The visit schedule is often as follows:  Up to week 28 of pregnancy: once every 4 weeks.  28-36 weeks: once every 2 weeks.  After 36 weeks: every week until delivery. Some women may have visits more or less often depending on any underlying health conditions and the health of the baby. Keep all follow-up and prenatal care visits. This is important. What happens during routine prenatal care visits? Your health care provider will:  Measure your weight   and blood pressure.  Check for fetal heart sounds.  Measure the height of your uterus in your abdomen (fundal height). This may be measured starting around week 20 of  pregnancy.  Check the position of your baby inside your uterus.  Ask questions about your diet, sleeping patterns, and whether you can feel the baby move.  Review warning signs to watch for and signs of labor.  Ask about any pregnancy symptoms you are having and how you are dealing with them. Symptoms may include: ? Headaches. ? Nausea and vomiting. ? Vaginal discharge. ? Swelling. ? Fatigue. ? Constipation. ? Changes in your vision. ? Feeling persistently sad or anxious. ? Any discomfort, including back or pelvic pain. ? Bleeding or spotting. Make a list of questions to ask your health care provider at your routine visits.   What tests might I have during prenatal care visits? You may have blood, urine, and imaging tests throughout your pregnancy, such as:  Urine tests to check for glucose, protein, or signs of infection.  Glucose tests to check for a form of diabetes that can develop during pregnancy (gestational diabetes mellitus). This is usually done around week 24 of pregnancy.  Ultrasounds to check your baby's growth and development, to check for birth defects, and to check your baby's well-being. These can also help to decide when you should deliver your baby.  A test to check for group B strep (GBS) infection. This is usually done around week 36 of pregnancy.  Genetic testing. This may include blood, fluid, or tissue sampling, or imaging tests, such as an ultrasound. Some genetic tests are done during the first trimester and some are done during the second trimester. What else can I expect during prenatal care visits? Your health care provider may recommend getting certain vaccines during pregnancy. These may include:  A yearly flu shot (annual influenza vaccine). This is especially important if you will be pregnant during flu season.  Tdap (tetanus, diphtheria, pertussis) vaccine. Getting this vaccine during pregnancy can protect your baby from whooping cough  (pertussis) after birth. This vaccine may be recommended between weeks 27 and 36 of pregnancy.  A COVID-19 vaccine. Later in your pregnancy, your health care provider may give you information about:  Childbirth and breastfeeding classes.  Choosing a health care provider for your baby.  Umbilical cord banking.  Breastfeeding.  Birth control after your baby is born.  The hospital labor and delivery unit and how to set up a tour.  Registering at the hospital before you go into labor. Where to find more information  Office on Women's Health: womenshealth.gov  American Pregnancy Association: americanpregnancy.org  March of Dimes: marchofdimes.org Summary  Prenatal care helps you and your baby stay as healthy as possible during pregnancy.  Your first prenatal care visit will most likely be the longest.  You will have visits and tests throughout your pregnancy to monitor your health and your baby's health.  Bring a list of questions to your visits to ask your health care provider.  Make sure to keep all follow-up and prenatal care visits. This information is not intended to replace advice given to you by your health care provider. Make sure you discuss any questions you have with your health care provider. Document Revised: 02/21/2020 Document Reviewed: 02/21/2020 Elsevier Patient Education  2021 Elsevier Inc.  

## 2020-09-09 NOTE — Progress Notes (Signed)
Subjective:    Debra Hinton is a 25 y.o. female who presents for evaluation of amenorrhea. She believes she could be pregnant. Pregnancy is desired. Sexual Activity: single partner, contraception: none. Current symptoms also include: fatigue and positive home pregnancy test. Last period was normal. Not sure of dates.   Patient's last menstrual period was 07/29/2020 (exact date). The following portions of the patient's history were reviewed and updated as appropriate: allergies, current medications, past family history, past medical history, past social history, past surgical history and problem list.  Review of Systems Pertinent items are noted in HPI.     Objective:    BP 118/76   Pulse 78   Resp 16   Ht 5' (1.524 m)   Wt 162 lb 3.2 oz (73.6 kg)   LMP 07/29/2020 (Exact Date)   BMI 31.68 kg/m  General: alert, cooperative, appears stated age and no acute distress    Lab Review Urine HCG: positive    Assessment:    Absence of menstruation.     Plan:   Positive: EDC: 05/03/2021. Briefly discussed pre-natal care options. Md and midwifery care reviewed, Request midwives plans water birth.  Encouraged well-balanced diet, plenty of rest when needed, pre-natal vitamins daily and walking for exercise. Discussed self-help for nausea, avoiding OTC medications until consulting provider or pharmacist, other than Tylenol as needed, minimal caffeine (1-2 cups daily) and avoiding alcohol. She will schedule dating u/s in 2 wks, nurse visit in 4 wks and her initial NOB visit in in 6 wks. Feel free to call with any questions.    Doreene Burke, CNM

## 2020-09-11 ENCOUNTER — Other Ambulatory Visit: Payer: Self-pay | Admitting: Obstetrics and Gynecology

## 2020-09-17 ENCOUNTER — Ambulatory Visit (INDEPENDENT_AMBULATORY_CARE_PROVIDER_SITE_OTHER): Payer: Commercial Managed Care - PPO | Admitting: Obstetrics and Gynecology

## 2020-09-17 ENCOUNTER — Other Ambulatory Visit: Payer: Self-pay

## 2020-09-17 VITALS — BP 113/78 | HR 80 | Wt 162.8 lb

## 2020-09-17 DIAGNOSIS — N926 Irregular menstruation, unspecified: Secondary | ICD-10-CM | POA: Diagnosis not present

## 2020-09-17 DIAGNOSIS — O99281 Endocrine, nutritional and metabolic diseases complicating pregnancy, first trimester: Secondary | ICD-10-CM | POA: Diagnosis not present

## 2020-09-17 DIAGNOSIS — E0590 Thyrotoxicosis, unspecified without thyrotoxic crisis or storm: Secondary | ICD-10-CM

## 2020-09-17 NOTE — Progress Notes (Signed)
HPI:      [name] is a 25 y.o. [id number] who LMP was Patient's last menstrual period was 07/29/2020 (exact date).  Subjective:   She presents today because she has concerns regarding her history of hyperthyroidism and hyperemesis.  She is approximately 7 weeks estimated gestational age.  Her previous pregnancy was complicated by hyperthyroidism which did not require medical treatment.  She states she also lost 60 pounds during this pregnancy because of persistent nausea and vomiting.    Hx: The following portions of the patient's history were reviewed and updated as appropriate:             She  has a past medical history of Anxiety, Anxiety and depression, Depressive disorder, GERD (gastroesophageal reflux disease), Hyperthyroidism (2019), IBS (irritable bowel syndrome), Irritable bowel syndrome (IBS), and Thyroid disease. She does not have any pertinent problems on file. She  has a past surgical history that includes No past surgeries. Her family history includes Alcoholism in her paternal grandmother; Breast cancer in her maternal aunt and another family member; Cancer in her paternal grandfather; Dementia in her maternal grandmother; Diabetes in her paternal grandmother; Healthy in her brother; Heart attack in her maternal grandfather; Hyperlipidemia in her father; Hypertension in her mother; Hypothyroidism in her maternal aunt and maternal aunt; Thyroid disease in her maternal grandmother. She  reports that she quit smoking about 2 years ago. She has never used smokeless tobacco. She reports previous alcohol use. She reports that she does not use drugs. She has a current medication list which includes the following prescription(s): cetirizine, ranitidine, amoxicillin-clavulanate, fluconazole, multivitamin, norethindrone, and sertraline. She has No Known Allergies.       Review of Systems:  Review of Systems  Constitutional: Denied constitutional symptoms, night sweats, recent  illness, fatigue, fever, insomnia and weight loss.  Eyes: Denied eye symptoms, eye pain, photophobia, vision change and visual disturbance.  Ears/Nose/Throat/Neck: Denied ear, nose, throat or neck symptoms, hearing loss, nasal discharge, sinus congestion and sore throat.  Cardiovascular: Denied cardiovascular symptoms, arrhythmia, chest pain/pressure, edema, exercise intolerance, orthopnea and palpitations.  Respiratory: Denied pulmonary symptoms, asthma, pleuritic pain, productive sputum, cough, dyspnea and wheezing.  Gastrointestinal: Denied, gastro-esophageal reflux, melena, nausea and vomiting.  Genitourinary: Denied genitourinary symptoms including symptomatic vaginal discharge, pelvic relaxation issues, and urinary complaints.  Musculoskeletal: Denied musculoskeletal symptoms, stiffness, swelling, muscle weakness and myalgia.  Dermatologic: Denied dermatology symptoms, rash and scar.  Neurologic: Denied neurology symptoms, dizziness, headache, neck pain and syncope.  Psychiatric: Denied psychiatric symptoms, anxiety and depression.  Endocrine: Denied endocrine symptoms including hot flashes and night sweats.   Meds:   Current Outpatient Medications on File Prior to Visit  Medication Sig Dispense Refill  . cetirizine (ZYRTEC) 10 MG tablet Take 10 mg by mouth daily.    . ranitidine (ZANTAC) 150 MG capsule Take 150 mg by mouth 2 (two) times daily.    Marland Kitchen amoxicillin-clavulanate (AUGMENTIN) 875-125 MG tablet Take 1 tablet by mouth 2 (two) times daily. (Patient not taking: No sig reported) 14 tablet 0  . fluconazole (DIFLUCAN) 150 MG tablet TAKE 1 TAB PO ONCE. MAY REPEAT IN 3 DAYS IF SYMPTOMS PERSIST. (Patient not taking: No sig reported) 3 tablet 0  . multivitamin (VIT W/EXTRA C) CHEW chewable tablet Chew 2 tablets by mouth daily.    . norethindrone (MICRONOR) 0.35 MG tablet Take 1 tablet (0.35 mg total) by mouth daily. (Patient not taking: No sig reported) 3 Package 4  . sertraline (ZOLOFT)  50 MG  tablet Take 1 tablet (50 mg total) by mouth daily. (Patient not taking: No sig reported) 30 tablet 11   No current facility-administered medications on file prior to visit.          Objective:     Vitals:   09/17/20 0940  BP: 113/78  Pulse: 80   Filed Weights   09/17/20 0940  Weight: 162 lb 12.8 oz (73.8 kg)              Records from previous pregnancy reviewed.  Assessment:    [id number] Patient Active Problem List   Diagnosis Date Noted  . Graves disease 07/26/2018  . GERD (gastroesophageal reflux disease) 05/31/2018  . Hyperthyroidism 04/10/2018  . Irritable bowel syndrome with constipation 02/22/2018  . Generalized anxiety disorder 02/22/2018  . Vitamin D deficiency 02/22/2018     1. Missed period   2. Hyperthyroidism affecting pregnancy in first trimester        Plan:            1.  Recommend complete thyroid panel with prenatal lab work.  Determination at that time regarding endocrinology follow-up versus thyroid supplementation as needed.  2.  Relationship of abnormal thyroid testing and hyperemesis discussed.  Patient currently able to keep liquids and some food down daily.  3.  [facility] doctors and midwives discussed.  Patient has not yet decided upon care plan. Orders Orders Placed This Encounter  Procedures  . US OB Comp Less 14 Wks  . Thyroid Profile    No orders of the defined types were placed in this encounter.     F/U  No follow-ups on file. I spent 24 minutes involved in the care of this patient preparing to see the patient by obtaining and reviewing her medical history (including labs, imaging tests and prior procedures), documenting clinical information in the electronic health record (EHR), counseling and coordinating care plans, writing and sending prescriptions, ordering tests or procedures and directly communicating with the patient by discussing pertinent items from her history and physical exam as well as detailing my assessment and  plan as noted above so that she has an informed understanding.  All of her questions were answered.  Elonda Husky, M.D. 09/17/2020 10:12 AM

## 2020-09-25 ENCOUNTER — Other Ambulatory Visit: Payer: Self-pay | Admitting: Obstetrics and Gynecology

## 2020-09-25 ENCOUNTER — Other Ambulatory Visit: Payer: Self-pay

## 2020-09-25 ENCOUNTER — Ambulatory Visit
Admission: RE | Admit: 2020-09-25 | Discharge: 2020-09-25 | Disposition: A | Discharge status: Home / Self Care | Payer: Commercial Managed Care - PPO | Source: Ambulatory Visit | Attending: Obstetrics and Gynecology | Admitting: Obstetrics and Gynecology

## 2020-09-25 DIAGNOSIS — N926 Irregular menstruation, unspecified: Secondary | ICD-10-CM | POA: Diagnosis not present

## 2020-10-13 ENCOUNTER — Ambulatory Visit (INDEPENDENT_AMBULATORY_CARE_PROVIDER_SITE_OTHER): Payer: Commercial Managed Care - PPO

## 2020-10-13 ENCOUNTER — Other Ambulatory Visit: Payer: Self-pay

## 2020-10-13 VITALS — BP 114/77 | HR 89 | Ht 60.0 in | Wt 159.5 lb

## 2020-10-13 DIAGNOSIS — Z3481 Encounter for supervision of other normal pregnancy, first trimester: Secondary | ICD-10-CM

## 2020-10-13 DIAGNOSIS — Z113 Encounter for screening for infections with a predominantly sexual mode of transmission: Secondary | ICD-10-CM | POA: Diagnosis not present

## 2020-10-13 DIAGNOSIS — Z0283 Encounter for blood-alcohol and blood-drug test: Secondary | ICD-10-CM

## 2020-10-13 DIAGNOSIS — R638 Other symptoms and signs concerning food and fluid intake: Secondary | ICD-10-CM

## 2020-10-13 DIAGNOSIS — Z3A11 11 weeks gestation of pregnancy: Secondary | ICD-10-CM

## 2020-10-13 DIAGNOSIS — Z3491 Encounter for supervision of normal pregnancy, unspecified, first trimester: Secondary | ICD-10-CM | POA: Diagnosis not present

## 2020-10-13 LAB — OB RESULTS CONSOLE GC/CHLAMYDIA: Gonorrhea: NEGATIVE

## 2020-10-13 LAB — OB RESULTS CONSOLE VARICELLA ZOSTER ANTIBODY, IGG: Varicella: IMMUNE

## 2020-10-13 NOTE — Progress Notes (Signed)
      Duanne Guess presents for NOB nurse intake visit. Pregnancy confirmation done at [facility], 08/26/2020  , with Zipporah Plants, CNM.  G 3.  P2002.  LMP 07/29/2020.  EDD 05/05/2021.  Ga [redacted]w[redacted]d. Pregnancy education material explained and given.  0 cats in the home.  NOB labs ordered. BMI greater than 30. TSH/HbgA1c ordered. Sickle cell not ordered due to race. HIV and drug screen explained and ordered. Genetic screening discussed. Genetic testing; Pt will get genetic testing done during NOB PE with provider. Pt to discuss genetic testing with provider. PNV encouraged. Pt to follow up with provider in 2 weeks for NOB physical.  FMLA/[employer] Finanical Policy/HIV/Drug screening forms all reviewed and signed by patient.   Patient requested to see a midwife.

## 2020-10-13 NOTE — Patient Instructions (Signed)
Tests and Screening During Pregnancy Having certain tests and screenings during pregnancy is an important part of your prenatal care. These tests help your health care provider find problems that might affect your pregnancy. Some tests must be done for all pregnant women, and some are optional. Most of the tests and screenings do not pose any risks for you or your baby. You may need additional testing if any routine tests indicate a problem. Tests and screenings done early in pregnancy Some tests and screenings you can expect to have in early pregnancy include:  Blood tests, such as: ? Complete blood count (CBC). This test is done to check your red and white blood cells. It can help identify a risk for anemia, infection, or bleeding. ? Blood typing. This test shows your blood type. It also shows whether you have a certain protein in your red blood cells called the Rh factor. It can be dangerous for your baby if you do not have this protein (Rh negative) and your baby has it (Rh positive). ? Tests to check for diseases that can cause birth defects or can be passed to your baby, such as:  [place] measles (rubella) and chicken pox. The test indicates whether you are immune to these diseases.  Hepatitis B and C.  Human Immunodeficiency Virus (HIV).  Syphilis.  Zika virus, if you or your partner has traveled to an area where the virus occurs.  Urine testing. This checks for sugar in your urine and for signs of infection.  Blood pressure. This is to check for high blood pressure and preeclampsia.  Testing for sexually transmitted infections (STIs), such as chlamydia or gonorrhea.  Testing for tuberculosis. You may have this skin test if you are at risk for tuberculosis.  Fetal ultrasound. This is an imaging study of your growing baby. It uses sound waves to create pictures of your baby. This test may be done to help determine your due date and to ensure you do not have an ectopic pregnancy. An  ectopic pregnancy is a pregnancy that grows outside of the uterus.   Tests and screenings done later in pregnancy Certain tests are done for the first time later in the pregnancy. Some of the tests that were done in early pregnancy are repeated at this time. Some common tests you can expect to have later in pregnancy include:  Rh antibody testing. If you are Rh negative, you will have a blood test at about 28 weeks of pregnancy to see if you are producing Rh antibodies. If you have not started to make antibodies, you will be given an injection to prevent you from making antibodies for the rest of your pregnancy.  Glucose screening. This checks your blood sugar. It will show whether you are developing the type of diabetes that occurs during pregnancy (gestational diabetes). You may have this screening earlier if you have risk factors for diabetes.  Screening for group B streptococcus (GBS). GBS is a type of bacteria that may live in your rectum or vagina. GBS can spread to your baby during birth. This is done at 35-37 weeks of pregnancy. If testing is positive for GBS, you may be treated with antibiotic medicine.  Urine and blood tests to monitor for other pregnancy problems, such as preeclampsia or anemia.  Blood pressure to monitor for high blood pressure and preeclampsia.  Fetal ultrasound. This may be repeated at 16-20 weeks to check how your baby is growing and developing.  Non-stress test. This test is  done later in pregnancy to check your baby's heart rate. This may be repeated weekly if your pregnancy is high risk.  Biophysical profile. This test includes ultrasound imaging and a non-stress test to ensure your baby is healthy. This test may help decide when your baby should be born.   Screening for birth defects Some birth defects are caused by abnormal genes passed down through families. Early in your pregnancy, tests can be done to find out if your baby is at risk for a genetic disorder.  This testing is optional. The type of testing recommended for you will depend on your family and medical history, your ethnicity, and your age. Testing may include:  Screening tests. These tests may include an ultrasound, blood tests, or a combination of both. The blood tests are used to check for abnormal genes and the ultrasound is done to look for early birth defects.  Carrier screening. This test involves checking the blood or saliva of both parents to see if they carry abnormal genes that could be passed down to a baby. If genetic screening shows that your baby is at risk for a genetic defect, additional diagnostic testing may be recommended, such as:  Amniocentesis. This involves testing a sample of fluid from your womb (amniotic fluid).  Chorionic villus sampling. In this test, a sample of cells from your placenta is checked for abnormal cells. Unlike other tests done during pregnancy, diagnostic testing does have some risk for your pregnancy. Talk to your health care provider about the risks and benefits of genetic testing. Questions to ask your health care provider  What routine tests are recommended for me?  When and how will these tests be done?  When will I get the results of routine tests?  What do the results of these tests mean for me or my baby?  Do you recommend any genetic screening tests? Which ones?  Should I see a genetic counselor before having genetic screening? Where to find more information  American Pregnancy Association: americanpregnancy.org/prenatal-testing  [employer]ians [employer] Gynecologists: JewelryExec.com.pt  Office on [employer]: KeywordPortfolios.com.br  March of Dimes: marchofdimes.org/pregnancy Summary  Having certain tests and screenings during pregnancy is an important part of your prenatal care. Talk to your health care provider about what tests are right for you and your baby.  In early pregnancy, testing may be done to  check your risks for various conditions that can affect you and your baby.  Later in pregnancy, tests may be done to ensure that your baby is growing normally and that you and your baby are staying healthy during the pregnancy.  Genetic testing is optional. Talk to your health care provider about the risks and benefits of genetic testing. This information is not intended to replace advice given to you by your health care provider. Make sure you discuss any questions you have with your health care provider. Document Revised: 01/29/2020 Document Reviewed: 01/29/2020 Elsevier Patient Education  [place]. WHAT OB PATIENTS CAN EXPECT   Confirmation of pregnancy and ultrasound ordered if medically indicated-[redacted] weeks gestation  New OB (NOB) intake with nurse and New OB (NOB) labs- [redacted] weeks gestation  New OB (NOB) physical examination with provider- 11/[redacted] weeks gestation  Flu vaccine-[redacted] weeks gestation  Anatomy scan-[redacted] weeks gestation  Glucose tolerance test, blood work to test for anemia, T-dap vaccine-[redacted] weeks gestation  Vaginal swabs/cultures-STD/Group B strep-[redacted] weeks gestation  Appointments every 4 weeks until 28 weeks  Every 2 weeks from 28 weeks  until 36 weeks  Weekly visits from 36 weeks until delivery  Morning Sickness  Morning sickness is when you feel like you may vomit (feel nauseous) during pregnancy. Sometimes, you may vomit. Morning sickness most often happens in the morning, but it can also happen at any time of the day. Some women may have morning sickness that makes them vomit all the time. This is a more serious problem that needs treatment. What are the causes? The cause of this condition is not known. What increases the risk?  You had vomiting or a feeling like you may vomit before your pregnancy.  You had morning sickness in another pregnancy.  You are pregnant with more than one baby, such as twins. What are the signs or symptoms?  Feeling like you  may vomit.  Vomiting. How is this treated? Treatment is usually not needed for this condition. You may only need to change what you eat. In some cases, your doctor may give you some things to take for your condition. These include:  Vitamin B6 supplements.  Medicines to treat the feeling that you may vomit.  Ginger. Follow these instructions at home: Medicines  Take over-the-counter and prescription medicines only as told by your doctor. Do not take any medicines until you talk with your doctor about them first.  Take multivitamins before you get pregnant. These can stop or lessen the symptoms of morning sickness. Eating and drinking  Eat dry toast or crackers before getting out of bed.  Eat 5 or 6 small meals a day.  Eat dry and bland foods like rice and baked potatoes.  Do not eat greasy, fatty, or spicy foods.  Have someone cook for you if the smell of food causes you to vomit or to feel like you may vomit.  If you feel like you may vomit after taking prenatal vitamins, take them at night or with a snack.  Eat protein foods when you need a snack. Nuts, yogurt, and cheese are good choices.  Drink fluids throughout the day.  Try ginger ale made with real ginger, ginger tea made from fresh grated ginger, or ginger candies. General instructions  Do not smoke or use any products that contain nicotine or tobacco. If you need help quitting, ask your doctor.  Use an air purifier to keep the air in your house free of smells.  Get lots of fresh air.  Try to avoid smells that make you feel sick.  Try wearing an acupressure wristband. This is a wristband that is used to treat seasickness.  Try a treatment called acupuncture. In this treatment, a doctor puts needles into certain areas of your body to make you feel better. Contact a doctor if:  You need medicine to feel better.  You feel dizzy or light-headed.  You are losing weight. Get help right away if:  The feeling  that you may vomit will not go away, or you cannot stop vomiting.  You faint.  You have very bad pain in your belly. Summary  Morning sickness is when you feel like you may vomit (feel nauseous) during pregnancy.  You may feel sick in the morning, but you can feel this way at any time of the day.  Making some changes to what you eat may help your symptoms go away. This information is not intended to replace advice given to you by your health care provider. Make sure you discuss any questions you have with your health care provider. Document Revised: 12/24/2019 Document  Reviewed: 12/03/2019 Elsevier Patient Education  2021 [place]. [email or web address].com.cy.html">  First Trimester of Pregnancy  The first trimester of pregnancy starts on the first day of your last menstrual period until the end of week 12. This is also called months 1 through 3 of pregnancy. Body changes during your first trimester Your body goes through many changes during pregnancy. The changes usually return to normal after your baby is born. Physical changes  You may gain or lose weight.  Your breasts may grow larger and hurt. The area around your nipples may get darker.  Dark spots or blotches may develop on your face.  You may have changes in your hair. Health changes  You may feel like you might vomit (nauseous), and you may vomit.  You may have heartburn.  You may have headaches.  You may have trouble pooping (constipation).  Your gums may bleed. Other changes  You may get tired easily.  You may pee (urinate) more often.  Your menstrual periods will stop.  You may not feel hungry.  You may want to eat certain kinds of food.  You may have changes in your emotions from day to day.  You may have more dreams. Follow these instructions at home: Medicines  Take over-the-counter and prescription medicines only as told by your doctor. Some medicines are not safe during  pregnancy.  Take a prenatal vitamin that contains at least 600 micrograms (mcg) of folic acid. Eating and drinking  Eat healthy meals that include: ? Fresh fruits and vegetables. ? Whole grains. ? Good sources of protein, such as meat, eggs, or tofu. ? Low-fat dairy products.  Avoid raw meat and unpasteurized juice, milk, and cheese.  If you feel like you may vomit, or you vomit: ? Eat 4 or 5 small meals a day instead of 3 large meals. ? Try eating a few soda crackers. ? Drink liquids between meals instead of during meals.  You may need to take these actions to prevent or treat trouble pooping: ? Drink enough fluids to keep your pee (urine) pale yellow. ? Eat foods that are high in fiber. These include beans, whole grains, and fresh fruits and vegetables. ? Limit foods that are high in fat and sugar. These include fried or sweet foods. Activity  Exercise only as told by your doctor. Most people can do their usual exercise routine during pregnancy.  Stop exercising if you have cramps or pain in your lower belly (abdomen) or low back.  Do not exercise if it is too hot or too humid, or if you are in a place of great height (high altitude).  Avoid heavy lifting.  If you choose to, you may have sex unless your doctor tells you not to. Relieving pain and discomfort  Wear a good support bra if your breasts are sore.  Rest with your legs raised (elevated) if you have leg cramps or low back pain.  If you have bulging veins (varicose veins) in your legs: ? Wear support hose as told by your doctor. ? Raise your feet for 15 minutes, 3-4 times a day. ? Limit salt in your food. Safety  Wear your seat belt at all times when you are in a car.  Talk with your doctor if someone is hurting you or yelling at you.  Talk with your doctor if you are feeling sad or have thoughts of hurting yourself. Lifestyle  Do not use hot tubs, steam rooms, or saunas.  Do not douche. Do not  use  tampons or scented sanitary pads.  Do not use herbal medicines, illegal drugs, or medicines that are not approved by your doctor. Do not drink alcohol.  Do not smoke or use any products that contain nicotine or tobacco. If you need help quitting, ask your doctor.  Avoid cat litter boxes and soil that is used by cats. These carry germs that can cause harm to the baby and can cause a loss of your baby by miscarriage or stillbirth. General instructions  Keep all follow-up visits. This is important.  Ask for help if you need counseling or if you need help with nutrition. Your doctor can give you advice or tell you where to go for help.  Visit your dentist. At home, brush your teeth with a soft toothbrush. Floss gently.  Write down your questions. Take them to your prenatal visits. Where to find more information  American Pregnancy Association: americanpregnancy.org  [employer]ians [employer]necologists: www.acog.org  Office on Women's Health: KeywordPortfolios.com.br Contact a doctor if:  You are dizzy.  You have a fever.  You have mild cramps or pressure in your lower belly.  You have a nagging pain in your belly area.  You continue to feel like you may vomit, you vomit, or you have watery poop (diarrhea) for 24 hours or longer.  You have a bad-smelling fluid coming from your vagina.  You have pain when you pee.  You are exposed to a disease that spreads from person to person, such as chickenpox, measles, Zika virus, HIV, or hepatitis. Get help right away if:  You have spotting or bleeding from your vagina.  You have very bad belly cramping or pain.  You have shortness of breath or chest pain.  You have any kind of injury, such as from a fall or a car crash.  You have new or increased pain, swelling, or redness in an arm or leg. Summary  The first trimester of pregnancy starts on the first day of your last menstrual period until the end of week 12  (months 1 through 3).  Eat 4 or 5 small meals a day instead of 3 large meals.  Do not smoke or use any products that contain nicotine or tobacco. If you need help quitting, ask your doctor.  Keep all follow-up visits. This information is not intended to replace advice given to you by your health care provider. Make sure you discuss any questions you have with your health care provider. Document Revised: 10/17/2019 Document Reviewed: 08/23/2019 Elsevier Patient Education  2021 [place]. Commonly Asked Questions During Pregnancy  Cats: A parasite can be excreted in cat feces.  To avoid exposure you need to have another person empty the little box.  If you must empty the litter box you will need to wear gloves.  Wash your hands after handling your cat.  This parasite can also be found in raw or undercooked meat so this should also be avoided.  Colds, Sore Throats, Flu: Please check your medication sheet to see what you can take for symptoms.  If your symptoms are unrelieved by these medications please call the office.  Dental Work: Most any dental work Investment banker, corporate recommends is permitted.  X-rays should only be taken during the first trimester if absolutely necessary.  Your abdomen should be shielded with a lead apron during all x-rays.  Please notify your provider prior to receiving any x-rays.  Novocaine is fine; gas is not recommended.  If your dentist  requires a note from [place] prior to dental work please call the office and we will provide one for you.  Exercise: Exercise is an important part of staying healthy during your pregnancy.  You may continue most exercises you were accustomed to prior to pregnancy.  Later in your pregnancy you will most likely notice you have difficulty with activities requiring balance like riding a bicycle.  It is important that you listen to your body and avoid activities that put you at a higher risk of falling.  Adequate rest and staying well hydrated are a  must!  If you have questions about the safety of specific activities ask your provider.    Exposure to Children with illness: Try to avoid obvious exposure; report any symptoms to [place] when noted,  If you have chicken pos, red measles or mumps, you should be immune to these diseases.   Please do not take any vaccines while pregnant unless you have checked with your OB provider.  Fetal Movement: After 28 weeks we recommend you do "kick counts" twice daily.  Lie or sit down in a calm quiet environment and count your baby movements "kicks".  You should feel your baby at least 10 times per hour.  If you have not felt 10 kicks within the first hour get up, walk around and have something sweet to eat or drink then repeat for an additional hour.  If count remains less than 10 per hour notify your provider.  Fumigating: Follow your pest control agent's advice as to how long to stay out of your home.  Ventilate the area well before re-entering.  Hemorrhoids:   Most over-the-counter preparations can be used during pregnancy.  Check your medication to see what is safe to use.  It is important to use a stool softener or fiber in your diet and to drink lots of liquids.  If hemorrhoids seem to be getting worse please call the office.   Hot Tubs:  Hot tubs Jacuzzis and saunas are not recommended while pregnant.  These increase your internal body temperature and should be avoided.  Intercourse:  Sexual intercourse is safe during pregnancy as long as you are comfortable, unless otherwise advised by your provider.  Spotting may occur after intercourse; report any bright red bleeding that is heavier than spotting.  Labor:  If you know that you are in labor, please go to the hospital.  If you are unsure, please call the office and let us help you decide what to do.  Lifting, straining, etc:  If your job requires heavy lifting or straining please check with your provider for any limitations.  Generally, you should not lift  items heavier than that you can lift simply with your hands and arms (no back muscles)  Painting:  Paint fumes do not harm your pregnancy, but may make you ill and should be avoided if possible.  Latex or water based paints have less odor than oils.  Use adequate ventilation while painting.  Permanents & Hair Color:  Chemicals in hair dyes are not recommended as they cause increase hair dryness which can increase hair loss during pregnancy.  " Highlighting" and permanents are allowed.  Dye may be absorbed differently and permanents may not hold as well during pregnancy.  Sunbathing:  Use a sunscreen, as skin burns easily during pregnancy.  Drink plenty of fluids; avoid over heating.  Tanning Beds:  Because their possible side effects are still unknown, tanning beds are not recommended.  Ultrasound Scans:  Routine ultrasounds are performed at approximately 20 weeks.  You will be able to see your baby's general anatomy an if you would like to know the gender this can usually be determined as well.  If it is questionable when you conceived you may also receive an ultrasound early in your pregnancy for dating purposes.  Otherwise ultrasound exams are not routinely performed unless there is a medical necessity.  Although you can request a scan we ask that you pay for it when conducted because insurance does not cover " patient request" scans.  Work: If your pregnancy proceeds without complications you may work until your due date, unless your physician or employer advises otherwise.  Round Ligament Pain/Pelvic Discomfort:  Sharp, shooting pains not associated with bleeding are fairly common, usually occurring in the second trimester of pregnancy.  They tend to be worse when standing up or when you remain standing for long periods of time.  These are the result of pressure of certain pelvic ligaments called "round ligaments".  Rest, Tylenol and heat seem to be the most effective relief.  As the womb and  fetus grow, they rise out of the pelvis and the discomfort improves.  Please notify the office if your pain seems different than that described.  It may represent a more serious condition.  Common Medications Safe in Pregnancy  Acne:      Constipation:  Benzoyl Peroxide     Colace  Clindamycin      Dulcolax Suppository  Topica Erythromycin     Fibercon  Salicylic Acid      Metamucil         Miralax AVOID:        Senakot   Accutane    Cough:  Retin-A       Cough Drops  Tetracycline      Phenergan w/ Codeine if Rx  Minocycline      Robitussin (Plain & DM)  Antibiotics:     Crabs/Lice:  Ceclor       RID  Cephalosporins    AVOID:  E-Mycins      Kwell  Keflex  Macrobid/Macrodantin   Diarrhea:  Penicillin      Kao-Pectate  Zithromax      Imodium AD         PUSH FLUIDS AVOID:       Cipro     Fever:  Tetracycline      Tylenol (Regular or Extra  Minocycline       Strength)  Levaquin      Extra Strength-Do not          Exceed 8 tabs/24 hrs Caffeine:        <222m/day (equiv. To 1 cup of coffee or  approx. 3 12 oz sodas)         Gas: Cold/Hayfever:       Gas-X  Benadryl      Mylicon  Claritin       Phazyme  **Claritin-D        Chlor-Trimeton    Headaches:  Dimetapp      ASA-Free Excedrin  Drixoral-Non-Drowsy     Cold Compress  Mucinex (Guaifenasin)     Tylenol (Regular or Extra  Sudafed/Sudafed-12 Hour     Strength)  **Sudafed PE Pseudoephedrine   Tylenol Cold & Sinus     Vicks Vapor Rub  Zyrtec  **AVOID if Problems With Blood Pressure         Heartburn: Avoid lying down for at least 1 hour  after meals  Aciphex      Maalox     Rash:  Milk of Magnesia     Benadryl    Mylanta       1% Hydrocortisone Cream  Pepcid  Pepcid Complete   Sleep Aids:  Prevacid      Ambien   Prilosec       Benadryl  Rolaids       Chamomile Tea  Tums (Limit 4/day)     Unisom         Tylenol PM         Warm milk-add vanilla or  Hemorrhoids:       Sugar for taste  Anusol/Anusol  H.C.  (RX: Analapram 2.5%)  Sugar Substitutes:  Hydrocortisone OTC     Ok in moderation  Preparation H      Tucks        Vaseline lotion applied to tissue with wiping    Herpes:     Throat:  Acyclovir      Oragel  Famvir  Valtrex     Vaccines:         Flu Shot Leg Cramps:       *Gardasil  Benadryl      Hepatitis A         Hepatitis B Nasal Spray:       Pneumovax  Saline Nasal Spray     Polio Booster         Tetanus Nausea:       Tuberculosis test or PPD  Vitamin B6 25 mg TID   AVOID:    Dramamine      *Gardasil  Emetrol       Live Poliovirus  Ginger Root 250 mg QID    MMR (measles, mumps &  High Complex Carbs @ Bedtime    rebella)  Sea Bands-Accupressure    Varicella (Chickenpox)  Unisom 1/2 tab TID     *No known complications           If received before Pain:         Known pregnancy;   Darvocet       Resume series after  Lortab        Delivery  Percocet    Yeast:   Tramadol      Femstat  Tylenol 3      Gyne-lotrimin  Ultram       Monistat  Vicodin           MISC:         All Sunscreens           Hair Coloring/highlights          Insect Repellant's          (Including DEET)         Mystic Tans

## 2020-10-14 LAB — URINALYSIS, ROUTINE W REFLEX MICROSCOPIC
Bilirubin, UA: NEGATIVE
Glucose, UA: NEGATIVE
Nitrite, UA: NEGATIVE
RBC, UA: NEGATIVE
Specific Gravity, UA: 1.027 (ref 1.005–1.030)
Urobilinogen, Ur: 1 mg/dL (ref 0.2–1.0)
pH, UA: 7 (ref 5.0–7.5)

## 2020-10-14 LAB — VIRAL HEPATITIS HBV, HCV
HCV Ab: <0.1 s/co ratio (ref 0.0–0.9)
Hep B Core Total Ab: NEGATIVE
Hep B Surface Ab, Qual: NONREACTIVE
Hepatitis B Surface Ag: NEGATIVE

## 2020-10-14 LAB — MICROSCOPIC EXAMINATION: Casts: NONE SEEN /lpf

## 2020-10-14 LAB — GC/CHLAMYDIA PROBE AMP
Chlamydia trachomatis, NAA: NEGATIVE
Neisseria Gonorrhoeae by PCR: NEGATIVE

## 2020-10-14 LAB — HEMOGLOBIN A1C
Est. average glucose Bld gHb Est-mCnc: 94 mg/dL
Hgb A1c MFr Bld: 4.9 % (ref 4.8–5.6)

## 2020-10-14 LAB — ANTIBODY SCREEN: Antibody Screen: NEGATIVE

## 2020-10-14 LAB — ABO AND RH: Rh Factor: POSITIVE

## 2020-10-14 LAB — HIV ANTIBODY (ROUTINE TESTING W REFLEX): HIV Screen 4th Generation wRfx: NONREACTIVE

## 2020-10-14 LAB — RPR: RPR Ser Ql: NONREACTIVE

## 2020-10-14 LAB — HCV INTERPRETATION

## 2020-10-14 LAB — RUBELLA SCREEN: Rubella Antibodies, IGG: 8.52 index (ref >0.99)

## 2020-10-14 LAB — TSH: TSH: 0.006 u[IU]/mL — ABNORMAL LOW (ref 0.450–4.500)

## 2020-10-14 LAB — VARICELLA ZOSTER ANTIBODY, IGG: Varicella zoster IgG: 755 index (ref >165)

## 2020-10-15 LAB — URINE CULTURE, OB REFLEX

## 2020-10-15 LAB — CULTURE, OB URINE

## 2020-10-21 ENCOUNTER — Ambulatory Visit (INDEPENDENT_AMBULATORY_CARE_PROVIDER_SITE_OTHER): Payer: Commercial Managed Care - PPO | Admitting: Certified Nurse Midwife

## 2020-10-21 ENCOUNTER — Other Ambulatory Visit: Payer: Self-pay

## 2020-10-21 ENCOUNTER — Encounter: Payer: Self-pay | Admitting: Certified Nurse Midwife

## 2020-10-21 VITALS — BP 100/70 | HR 105 | Wt 158.5 lb

## 2020-10-21 DIAGNOSIS — Z3A12 12 weeks gestation of pregnancy: Secondary | ICD-10-CM

## 2020-10-21 DIAGNOSIS — Z3481 Encounter for supervision of other normal pregnancy, first trimester: Secondary | ICD-10-CM

## 2020-10-21 LAB — POCT URINALYSIS DIPSTICK OB
Bilirubin, UA: NEGATIVE
Blood, UA: NEGATIVE
Glucose, UA: NEGATIVE
Ketones, UA: POSITIVE
Leukocytes, UA: NEGATIVE
Nitrite, UA: NEGATIVE
Spec Grav, UA: 1.015 (ref 1.010–1.025)
Urobilinogen, UA: 0.2 E.U./dL
pH, UA: 6 (ref 5.0–8.0)

## 2020-10-21 MED ORDER — DOXYLAMINE-PYRIDOXINE 10-10 MG PO TBEC: 1.0000 | DELAYED_RELEASE_TABLET | Freq: Four times a day (QID) | ORAL | 5 refills | Status: DC

## 2020-10-21 NOTE — Progress Notes (Signed)
NOB Physical: She is doing well, no new concerns.

## 2020-10-21 NOTE — Patient Instructions (Signed)
https://www.acog.org/womens-health/faqs/prenatal-genetic-screening-tests">  Prenatal Care Prenatal care is health care during pregnancy. It helps you and your unborn baby (fetus) stay as healthy as possible. Prenatal care may be provided by a midwife, a family practice doctor, a mid-level practitioner (nurse practitioner or physician assistant), or a childbirth and pregnancy doctor (obstetrician). How does this affect me? During pregnancy, you will be closely monitored for any new conditions that might develop. To lower your risk of pregnancy complications, you and your health care provider will talk about any underlying conditions you have. How does this affect my baby? Early and consistent prenatal care increases the chance that your baby will be healthy during pregnancy. Prenatal care lowers the risk that your baby will be:  Born early (prematurely).  Smaller than expected at birth (small for gestational age). What can I expect at the first prenatal care visit? Your first prenatal care visit will likely be the longest. You should schedule your first prenatal care visit as soon as you know that you are pregnant. Your first visit is a good time to talk about any questions or concerns you have about pregnancy. Medical history At your visit, you and your health care provider will talk about your medical history, including:  Any past pregnancies.  Your family's medical history.  Medical history of the baby's father.  Any long-term (chronic) health conditions you have and how you manage them.  Any surgeries or procedures you have had.  Any current over-the-counter or prescription medicines, herbs, or supplements that you are taking.  Other factors that could pose a risk to your baby, including: ? Exposure to harmful chemicals or radiation at work or at home. ? Any substance use, including tobacco, alcohol, and drug use.  Your home setting and your stress levels, including: ? Exposure to  abuse or violence. ? Household financial strain.  Your daily health habits, including diet and exercise. Tests and screenings Your health care provider will:  Measure your weight, height, and blood pressure.  Do a physical exam, including a pelvic and breast exam.  Perform blood tests and urine tests to check for: ? Urinary tract infection. ? Sexually transmitted infections (STIs). ? Low iron levels in your blood (anemia). ? Blood type and certain proteins on red blood cells (Rh antibodies). ? Infections and immunity to viruses, such as hepatitis B and rubella. ? HIV (human immunodeficiency virus).  Discuss your options for genetic screening. Tips about staying healthy Your health care provider will also give you information about how to keep yourself and your baby healthy, including:  Nutrition and taking vitamins.  Physical activity.  How to manage pregnancy symptoms such as nausea and vomiting (morning sickness).  Infections and substances that may be harmful to your baby and how to avoid them.  Food safety.  Dental care.  Working.  Travel.  Warning signs to watch for and when to call your health care provider. How often will I have prenatal care visits? After your first prenatal care visit, you will have regular visits throughout your pregnancy. The visit schedule is often as follows:  Up to week 28 of pregnancy: once every 4 weeks.  28-36 weeks: once every 2 weeks.  After 36 weeks: every week until delivery. Some women may have visits more or less often depending on any underlying health conditions and the health of the baby. Keep all follow-up and prenatal care visits. This is important. What happens during routine prenatal care visits? Your health care provider will:  Measure your weight   and blood pressure.  Check for fetal heart sounds.  Measure the height of your uterus in your abdomen (fundal height). This may be measured starting around week 20 of  pregnancy.  Check the position of your baby inside your uterus.  Ask questions about your diet, sleeping patterns, and whether you can feel the baby move.  Review warning signs to watch for and signs of labor.  Ask about any pregnancy symptoms you are having and how you are dealing with them. Symptoms may include: ? Headaches. ? Nausea and vomiting. ? Vaginal discharge. ? Swelling. ? Fatigue. ? Constipation. ? Changes in your vision. ? Feeling persistently sad or anxious. ? Any discomfort, including back or pelvic pain. ? Bleeding or spotting. Make a list of questions to ask your health care provider at your routine visits.   What tests might I have during prenatal care visits? You may have blood, urine, and imaging tests throughout your pregnancy, such as:  Urine tests to check for glucose, protein, or signs of infection.  Glucose tests to check for a form of diabetes that can develop during pregnancy (gestational diabetes mellitus). This is usually done around week 24 of pregnancy.  Ultrasounds to check your baby's growth and development, to check for birth defects, and to check your baby's well-being. These can also help to decide when you should deliver your baby.  A test to check for group B strep (GBS) infection. This is usually done around week 36 of pregnancy.  Genetic testing. This may include blood, fluid, or tissue sampling, or imaging tests, such as an ultrasound. Some genetic tests are done during the first trimester and some are done during the second trimester. What else can I expect during prenatal care visits? Your health care provider may recommend getting certain vaccines during pregnancy. These may include:  A yearly flu shot (annual influenza vaccine). This is especially important if you will be pregnant during flu season.  Tdap (tetanus, diphtheria, pertussis) vaccine. Getting this vaccine during pregnancy can protect your baby from whooping cough  (pertussis) after birth. This vaccine may be recommended between weeks 27 and 36 of pregnancy.  A COVID-19 vaccine. Later in your pregnancy, your health care provider may give you information about:  Childbirth and breastfeeding classes.  Choosing a health care provider for your baby.  Umbilical cord banking.  Breastfeeding.  Birth control after your baby is born.  The hospital labor and delivery unit and how to set up a tour.  Registering at the hospital before you go into labor. Where to find more information  Office on Women's Health: womenshealth.gov  American Pregnancy Association: americanpregnancy.org  March of Dimes: marchofdimes.org Summary  Prenatal care helps you and your baby stay as healthy as possible during pregnancy.  Your first prenatal care visit will most likely be the longest.  You will have visits and tests throughout your pregnancy to monitor your health and your baby's health.  Bring a list of questions to your visits to ask your health care provider.  Make sure to keep all follow-up and prenatal care visits. This information is not intended to replace advice given to you by your health care provider. Make sure you discuss any questions you have with your health care provider. Document Revised: 02/21/2020 Document Reviewed: 02/21/2020 Elsevier Patient Education  2021 Elsevier Inc.  

## 2020-10-21 NOTE — Progress Notes (Signed)
NEW OB HISTORY AND PHYSICAL  SUBJECTIVE:       Debra Hinton is a 25 y.o. G54P2002 female, Patient's last menstrual period was 07/29/2020 (exact date)., Estimated Date of Delivery: 05/05/21, [redacted]w[redacted]d, presents today for establishment of Prenatal Care. She has no unusual complaints. Has some nausea and vomiting but not as bad as she has had in previous pregnancy.   Body mass index is 30.95 kg/m.    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 Normal Contraception: none Last Pap: 05/31/2018 Results were: normal  Obstetric History OB History  Gravida Para Term Preterm AB Living  3 2 2  0 0 2  SAB IAB Ectopic Multiple Live Births  0 0 0 0 2    # Outcome Date GA Lbr Len/2nd Weight Sex Delivery Anes PTL Lv  3 Current           2 Term 02/25/19 [redacted]w[redacted]d 06:02 / 00:13 5 lb 4 oz (2.38 kg) F Vag-Spont EPI  LIV  1 Term 02/11/15 [redacted]w[redacted]d / 01:04 7 lb 8 oz (3.402 kg) M Vag-Spont EPI  LIV    Past Medical History:  Diagnosis Date  . Anxiety   . Anxiety and depression   . Depressive disorder   . GERD (gastroesophageal reflux disease)   . Hyperthyroidism 2019   Possible Graves disease  . IBS (irritable bowel syndrome)   . Irritable bowel syndrome (IBS)   . Thyroid disease     Past Surgical History:  Procedure Laterality Date  . NO PAST SURGERIES      Current Outpatient Medications on File Prior to Visit  Medication Sig Dispense Refill  . cetirizine (ZYRTEC) 10 MG tablet Take 10 mg by mouth daily.    . Prenatal Vit-Fe Fumarate-FA (PRENATAL VITAMINS PO) Take by mouth.    . ranitidine (ZANTAC) 150 MG capsule Take 150 mg by mouth 2 (two) times daily.     No current facility-administered medications on file prior to visit.    No Known Allergies  Social History   Socioeconomic History  . Marital  status: Single    Spouse name: 2020  . Number of children: 1  . Years of education: Not on file  . Highest education level: Not on file  Occupational History  . Not on file  Tobacco Use  . Smoking status: Former Smoker    Quit date: 07/03/2018    Years since quitting: 2.3  . Smokeless tobacco: Never Used  Vaping Use  . Vaping Use: Some days  Substance and Sexual Activity  . Alcohol use: Not Currently    Comment: 2 drinks/month  . Drug use: Never    Types: Marijuana  . Sexual activity: Yes    Comment: pt is trying to conceive  Other Topics Concern  . Not on file  Social History Narrative   ** Merged History Encounter **       Social Determinants of Health   Financial Resource Strain: Not on file  Food Insecurity: Not on file  Transportation Needs: Not on file  Physical Activity: Not on file  Stress: Not on file  Social Connections: Not on file  Intimate Partner Violence: Not on file    Family History  Problem Relation Age of Onset  . Breast cancer Other         mggm  . Hypertension Mother   .  Hyperlipidemia Father   . Thyroid disease Maternal Grandmother   . Dementia Maternal Grandmother   . Heart attack Maternal Grandfather   . Diabetes Paternal Grandmother   . Alcoholism Paternal Grandmother   . Cancer Paternal Grandfather        pancreatic  . Healthy Brother   . Hypothyroidism Maternal Aunt   . Hypothyroidism Maternal Aunt   . Breast cancer Maternal Aunt        30s    The following portions of the patient's history were reviewed and updated as appropriate: allergies, current medications, past OB history, past medical history, past surgical history, past family history, past social history, and problem list.    OBJECTIVE: Initial Physical Exam (New OB)  GENERAL APPEARANCE: alert, well appearing, in no apparent distress, oriented to person, place and time, overweight HEAD: normocephalic, atraumatic MOUTH: mucous membranes moist, pharynx normal  without lesions THYROID: no thyromegaly or masses present BREASTS: no masses noted, no significant tenderness, no palpable axillary nodes, no skin changes LUNGS: clear to auscultation, no wheezes, rales or rhonchi, symmetric air entry HEART: regular rate and rhythm, no murmurs ABDOMEN: soft, nontender, nondistended, no abnormal masses, no epigastric pain and FHT present EXTREMITIES: no redness or tenderness in the calves or thighs, no edema, no limitation in range of motion, intact peripheral pulses SKIN: normal coloration and turgor, no rashes LYMPH NODES: no adenopathy palpable NEUROLOGIC: alert, oriented, normal speech, no focal findings or movement disorder noted  PELVIC EXAM deferred, pt has tested pelvis and not due for pap.   ASSESSMENT: Normal pregnancy  PLAN: New OB counseling: The patient has been given an overview regarding routine prenatal care. Recommendations regarding diet, weight gain, and exercise in pregnancy were given. Prenatal testing, optional genetic testing, carrier screening, and ultrasound use in pregnancy were reviewed. Maternit 21 collected today. Benefits of Breast Feeding were discussed. The patient is encouraged to consider nursing her baby post partum.  Doreene Burke, CNM

## 2020-10-22 LAB — THYROID PANEL WITH TSH
Free Thyroxine Index: >8 — ABNORMAL HIGH (ref 1.2–4.9)
T3 Uptake Ratio: >56 % — ABNORMAL HIGH (ref 24–39)
T4, Total: 14.2 ug/dL — ABNORMAL HIGH (ref 4.5–12.0)
TSH: <0.005 u[IU]/mL — ABNORMAL LOW (ref 0.450–4.500)

## 2020-10-22 LAB — CBC
Hematocrit: 39.7 % (ref 34.0–46.6)
Hemoglobin: 13.6 g/dL (ref 11.1–15.9)
MCH: 30.8 pg (ref 26.6–33.0)
MCHC: 34.3 g/dL (ref 31.5–35.7)
MCV: 90 fL (ref 79–97)
Platelets: 184 10*3/uL (ref 150–450)
RBC: 4.42 x10E6/uL (ref 3.77–5.28)
RDW: 12.1 % (ref 11.7–15.4)
WBC: 6.6 10*3/uL (ref 3.4–10.8)

## 2020-10-24 ENCOUNTER — Ambulatory Visit: Payer: Commercial Managed Care - PPO | Admitting: Internal Medicine

## 2020-10-24 ENCOUNTER — Other Ambulatory Visit: Payer: Self-pay

## 2020-10-24 VITALS — BP 108/72 | HR 106 | Ht 60.0 in | Wt 157.0 lb

## 2020-10-24 DIAGNOSIS — E0590 Thyrotoxicosis, unspecified without thyrotoxic crisis or storm: Secondary | ICD-10-CM

## 2020-10-24 LAB — DRUG PROFILE, UR, 9 DRUGS (LABCORP)
Amphetamines, Urine: NEGATIVE ng/mL
Barbiturate Quant, Ur: NEGATIVE ng/mL
Benzodiazepine Quant, Ur: NEGATIVE ng/mL
Cannabinoid Quant, Ur: POSITIVE — AB
Cocaine (Metab.): NEGATIVE ng/mL
Methadone Screen, Urine: NEGATIVE ng/mL
Opiate Quant, Ur: NEGATIVE ng/mL
PCP Quant, Ur: NEGATIVE ng/mL
Propoxyphene: NEGATIVE ng/mL

## 2020-10-24 LAB — NICOTINE SCREEN, URINE: Cotinine Ql Scrn, Ur: NEGATIVE ng/mL

## 2020-10-24 NOTE — Progress Notes (Signed)
Name: Debra Hinton  MRN/ DOB: [id number], 1996/04/29    Age/ Sex: 25 y.o., female     PCP: Patient, No Pcp Per (Inactive)   Reason for Endocrinology Evaluation: .Hyperthyroidism     Initial Endocrinology Clinic Visit:  04/10/2018    PATIENT IDENTIFIER: Debra Hinton is a 25 y.o., female with a past medical history of IBS, GAD, and Graves' disease. She has followed with [facility] since 04/10/2018 for consultative assistance with management of her Hyperthyroidism .   HISTORICAL SUMMARY:  [name], 2019 with c/o constipation alternating with diarrhea as well as anxiety and weight gain. Her Labs revealed a suppressed TSH at <0.006 uIU/mL and elevated FT4 and T3. A thyroid ultrasound was unrevealing.   On her initial visit to our office in November, 2020 her TSH continued to be low but with normalization of her FT4. We decided to withhold therapy due to improvement in her TFT's    By 2020 she became pregnant and was started on PTU but was lost to follow-up until her return to our office in 10/2020 when she was [redacted] weeks pregnant at the time with a suppressed TSH 0.006 uIU/mL    FH with thyroid disease. SUBJECTIVE:     Today (10/24/2020):  Debra Hinton is here for a follow-up on Graves' disease during pregnancy.  She has noted weight loss She denies diarrhea /loose stools Denies tremors  Has occasional palpitations and feels presyncope at times   PTU caused chemical taste     HISTORY:  Past Medical History:  Past Medical History:  Diagnosis Date  . Anxiety   . Anxiety and depression   . Depressive disorder   . GERD (gastroesophageal reflux disease)   . Hyperthyroidism 2019   Possible Graves disease  . IBS (irritable bowel syndrome)   . Irritable bowel syndrome (IBS)   . Thyroid disease    Past Surgical History:  Past Surgical History:  Procedure Laterality Date  . NO PAST  SURGERIES      Social History:  reports that she quit smoking about 2 years ago. She has never used smokeless tobacco. She reports previous alcohol use. She reports that she does not use drugs. Family History:  Family History  Problem Relation Age of Onset  . Breast cancer Other         mggm  . Hypertension Mother   . Hyperlipidemia Father   . Thyroid disease Maternal Grandmother   . Dementia Maternal Grandmother   . Heart attack Maternal Grandfather   . Diabetes Paternal Grandmother   . Alcoholism Paternal Grandmother   . Cancer Paternal Grandfather        pancreatic  . Healthy Brother   . Hypothyroidism Maternal Aunt   . Hypothyroidism Maternal Aunt   . Breast cancer Maternal Aunt        30s     HOME MEDICATIONS: Allergies as of 10/24/2020   No Known Allergies     Medication List       Accurate as of October 24, 2020 10:56 AM. If you have any questions, ask your nurse or doctor.        cetirizine 10 MG tablet Commonly known as: ZYRTEC Take 10 mg by mouth daily.   Doxylamine-Pyridoxine 10-10 MG Tbec Take 1 tablet by mouth 4 (four) times daily. Day 1 &2: 2 tablet at bedtimeDay 3 : if symptoms persists 1 tablet am; 2 tablet  at bedtimeDay 4: 1 tablet am, 1 tab afternoon, 2 tab at bedtime   PRENATAL VITAMINS PO Take by mouth.   ranitidine 150 MG capsule Commonly known as: ZANTAC Take 150 mg by mouth 2 (two) times daily.         OBJECTIVE:   PHYSICAL EXAM: VS: BP 108/72   Pulse (!) 106   Ht 5' (1.524 m)   Wt 157 lb (71.2 kg)   LMP 07/29/2020 (Exact Date)   SpO2 98%   BMI 30.66 kg/m    EXAM: General: Pt appears well and is in NAD  Neck: General: Supple without adenopathy. Thyroid: Thyroid size normal.  No goiter or nodules appreciated. No thyroid bruit.  Lungs: Clear with good BS bilat with no rales, rhonchi, or wheezes  Heart: Auscultation: RRR.  Abdomen: Normoactive bowel sounds, soft, nontender, without masses or organomegaly palpable  Extremities:   BL LE: No pretibial edema normal ROM and strength.  Neuro: Cranial nerves: II - XII grossly intact  Motor: Normal strength throughout DTRs: 2+ and symmetric in UE without delay in relaxation phase  Mental Status: Judgment, insight: Intact Memory: Intact for recent and remote events Mood and affect: No depression, anxiety, or agitation     DATA REVIEWED:  Results for CHARNAE, LILL (MRN [id number]) as of 10/24/2020 10:57  Ref. Range 10/21/2020 09:54  TSH Latest Ref Range: 0.450 - 4.500 uIU/mL <0.005 (L)  Thyroxine (T4) Latest Ref Range: 4.5 - 12.0 ug/dL 33.2 (H)  Free Thyroxine Index Latest Ref Range: 1.2 - 4.9  >8.0 (H)  T3 Uptake Ratio Latest Ref Range: 24 - 39 % >56 (H)    ASSESSMENT / PLAN / RECOMMENDATIONS:   1. Hyperthyroidism During First Trimester:   - She is clinically euthyroid  - No local neck symptoms  - Recent TFT's show slight hyperthyroidism but NOT indication to treat at this time   - The goal of treatment is to maintain persistent but mild hyperthyroidism in the mother in an attempt to prevent fetal hypothyroidism since the fetal thyroid is more sensitive to the action of thionamide therapy. Overtreatment of maternal hyperthyroidism can cause fetal goiter and primary hypothyroidism.  - PTU caused metallic taste int he past, but since she had passed the 1st Trimester , will consider methimazole if needed   Labs in 4 weeks       F/U in 8 weeks    Signed electronically by: Lyndle Herrlich, MD  [facility] End[facility]  [facility] [address] [place] [place], [place] [address] Phone: [phone] FAX: [phone]      CC: Patient, No Pcp Per (Inactive) No address on file Phone: None  Fax: None   Return to Endocrinology clinic as below: Future Appointments  Date Time Provider Department Center  10/30/2020  3:00 PM [facility]  11/20/2020  3:00 PM [place], Vanessa [place], CNM [facility] None

## 2020-10-25 LAB — MATERNIT 21 PLUS CORE, BLOOD
Fetal Fraction: 7
Result (T21): NEGATIVE
Trisomy 13 (Patau syndrome): NEGATIVE
Trisomy 18 (Edwards syndrome): NEGATIVE
Trisomy 21 (Down syndrome): NEGATIVE

## 2020-10-26 ENCOUNTER — Emergency Department
Admission: EM | Admit: 2020-10-26 | Discharge: 2020-10-26 | Disposition: A | Discharge status: Home / Self Care | Payer: Commercial Managed Care - PPO | Attending: Emergency Medicine | Admitting: Emergency Medicine

## 2020-10-26 ENCOUNTER — Emergency Department: Payer: Commercial Managed Care - PPO

## 2020-10-26 ENCOUNTER — Encounter: Payer: Self-pay | Admitting: Emergency Medicine

## 2020-10-26 ENCOUNTER — Other Ambulatory Visit: Payer: Self-pay

## 2020-10-26 DIAGNOSIS — Z3A12 12 weeks gestation of pregnancy: Secondary | ICD-10-CM | POA: Diagnosis not present

## 2020-10-26 DIAGNOSIS — O208 Other hemorrhage in early pregnancy: Secondary | ICD-10-CM | POA: Insufficient documentation

## 2020-10-26 DIAGNOSIS — O4690 Antepartum hemorrhage, unspecified, unspecified trimester: Secondary | ICD-10-CM

## 2020-10-26 DIAGNOSIS — O4411 Placenta previa with hemorrhage, first trimester: Secondary | ICD-10-CM | POA: Insufficient documentation

## 2020-10-26 DIAGNOSIS — O200 Threatened abortion: Secondary | ICD-10-CM | POA: Diagnosis not present

## 2020-10-26 DIAGNOSIS — Z87891 Personal history of nicotine dependence: Secondary | ICD-10-CM | POA: Diagnosis not present

## 2020-10-26 DIAGNOSIS — O4401 Placenta previa specified as without hemorrhage, first trimester: Secondary | ICD-10-CM

## 2020-10-26 LAB — CBC
HCT: 39.2 % (ref 36.0–46.0)
Hemoglobin: 13.8 g/dL (ref 12.0–15.0)
MCH: 30.3 pg (ref 26.0–34.0)
MCHC: 35.2 g/dL (ref 30.0–36.0)
MCV: 86.2 fL (ref 80.0–100.0)
Platelets: 191 10*3/uL (ref 150–400)
RBC: 4.55 MIL/uL (ref 3.87–5.11)
RDW: 11.7 % (ref 11.5–15.5)
WBC: 6.2 10*3/uL (ref 4.0–10.5)
nRBC: 0 % (ref 0.0–0.2)

## 2020-10-26 LAB — URINALYSIS, COMPLETE (UACMP) WITH MICROSCOPIC
Bilirubin Urine: NEGATIVE
Glucose, UA: NEGATIVE mg/dL
Ketones, ur: NEGATIVE mg/dL
Leukocytes,Ua: NEGATIVE
Nitrite: NEGATIVE
Protein, ur: NEGATIVE mg/dL
Specific Gravity, Urine: 1.003 — ABNORMAL LOW (ref 1.005–1.030)
pH: 7 (ref 5.0–8.0)

## 2020-10-26 LAB — BASIC METABOLIC PANEL
Anion gap: 8 (ref 5–15)
BUN: 9 mg/dL (ref 6–20)
CO2: 22 mmol/L (ref 22–32)
Calcium: 8.8 mg/dL — ABNORMAL LOW (ref 8.9–10.3)
Chloride: 107 mmol/L (ref 98–111)
Creatinine, Ser: 0.5 mg/dL (ref 0.44–1.00)
GFR, Estimated: >60 mL/min (ref >60)
Glucose, Bld: 101 mg/dL — ABNORMAL HIGH (ref 70–99)
Potassium: 3.4 mmol/L — ABNORMAL LOW (ref 3.5–5.1)
Sodium: 137 mmol/L (ref 135–145)

## 2020-10-26 LAB — HCG, QUANTITATIVE, PREGNANCY: hCG, Beta Chain, Quant, S: 41825 m[IU]/mL — ABNORMAL HIGH (ref <5)

## 2020-10-26 LAB — ABO/RH: ABO/RH(D): O POS

## 2020-10-26 NOTE — ED Provider Notes (Signed)
[facility] Emergency Department Provider Note   ____________________________________________   Event Date/Time   First MD Initiated Contact with Patient 10/26/20 [phone]     (approximate)  I have reviewed the triage vital signs and the nursing notes.   HISTORY  Chief Complaint Vaginal Bleeding    HPI Debra Hinton is a 25 y.o. female who presents to the ED from home with a chief complaint of vaginal bleeding.  Patient is G3 P2 approximately [redacted] weeks pregnant followed by [facility].  Last sexual intercourse on Friday.  Yesterday wipes after urination and noted pink-tinged toilet paper and spotting.  Denies passing clots.  Denies fever, cough, chest pain, shortness of breath, pelvic cramps, vaginal discharge.     Past Medical History:  Diagnosis Date  . Anxiety   . Anxiety and depression   . Depressive disorder   . GERD (gastroesophageal reflux disease)   . Hyperthyroidism 2019   Possible Graves disease  . IBS (irritable bowel syndrome)   . Irritable bowel syndrome (IBS)   . Thyroid disease     Patient Active Problem List   Diagnosis Date Noted  . Graves disease 07/26/2018  . GERD (gastroesophageal reflux disease) 05/31/2018  . Hyperthyroidism 04/10/2018  . Irritable bowel syndrome with constipation 02/22/2018  . Generalized anxiety disorder 02/22/2018  . Vitamin D deficiency 02/22/2018    Past Surgical History:  Procedure Laterality Date  . NO PAST SURGERIES      Prior to Admission medications   Medication Sig Start Date End Date Taking? Authorizing Provider  cetirizine (ZYRTEC) 10 MG tablet Take 10 mg by mouth daily.    [provider]  Doxylamine-Pyridoxine 10-10 MG TBEC Take 1 tablet by mouth 4 (four) times daily. Day 1 &2: 2 tablet at bedtimeDay 3 : if symptoms persists 1 tablet am; 2 tablet at bedtimeDay 4: 1 tablet am, 1 tab afternoon, 2 tab at bedtime 10/21/20   Doreene Burke, CNM  Prenatal Vit-Fe Fumarate-FA  (PRENATAL VITAMINS PO) Take by mouth.    [provider]  ranitidine (ZANTAC) 150 MG capsule Take 150 mg by mouth 2 (two) times daily.    [provider]    Allergies Patient has no known allergies.  Family History  Problem Relation Age of Onset  . Breast cancer Other         mggm  . Hypertension Mother   . Hyperlipidemia Father   . Thyroid disease Maternal Grandmother   . Dementia Maternal Grandmother   . Heart attack Maternal Grandfather   . Diabetes Paternal Grandmother   . Alcoholism Paternal Grandmother   . Cancer Paternal Grandfather        pancreatic  . Healthy Brother   . Hypothyroidism Maternal Aunt   . Hypothyroidism Maternal Aunt   . Breast cancer Maternal Aunt        30s    Social History Social History   Tobacco Use  . Smoking status: Former Smoker    Quit date: 07/03/2018    Years since quitting: 2.3  . Smokeless tobacco: Never Used  Vaping Use  . Vaping Use: Some days  Substance Use Topics  . Alcohol use: Not Currently    Comment: 2 drinks/month  . Drug use: Never    Types: Marijuana    Review of Systems  Constitutional: No fever/chills Eyes: No visual changes. ENT: No sore throat. Cardiovascular: Denies chest pain. Respiratory: Denies shortness of breath. Gastrointestinal: No abdominal pain.  No nausea, no vomiting.  No  diarrhea.  No constipation. Genitourinary: Positive for vaginal bleeding.  Negative for dysuria. Musculoskeletal: Negative for back pain. Skin: Negative for rash. Neurological: Negative for headaches, focal weakness or numbness.   ____________________________________________   PHYSICAL EXAM:  VITAL SIGNS: ED Triage Vitals [10/26/20 0016]  Enc Vitals Group     BP 126/79     Pulse Rate 84     Resp 18     Temp 98.2 F (36.8 C)     Temp Source Oral     SpO2 100 %     Weight 157 lb (71.2 kg)     Height 5' (1.524 m)     Head Circumference      Peak Flow      Pain Score 0     Pain Loc      Pain  Edu?      Excl. in GC?     Constitutional: Alert and oriented. Well appearing and in no acute distress. Eyes: Conjunctivae are normal. PERRL. EOMI. Head: Atraumatic. Nose: No congestion/rhinnorhea. Mouth/Throat: Mucous membranes are moist.   Neck: No stridor.   Cardiovascular: Normal rate, regular rhythm. Grossly normal heart sounds.  Good peripheral circulation. Respiratory: Normal respiratory effort.  No retractions. Lungs CTAB. Gastrointestinal: Soft and nontender to light or deep palpation. No distention. No abdominal bruits. No CVA tenderness. Musculoskeletal: No lower extremity tenderness nor edema.  No joint effusions. Neurologic:  Normal speech and language. No gross focal neurologic deficits are appreciated. No gait instability. Skin:  Skin is warm, dry and intact. No rash noted. Psychiatric: Mood and affect are normal. Speech and behavior are normal.  ____________________________________________   LABS (all labs ordered are listed, but only abnormal results are displayed)  Labs Reviewed  BASIC METABOLIC PANEL - Abnormal; Notable for the following components:      Result Value   Potassium 3.4 (*)    Glucose, Bld 101 (*)    Calcium 8.8 (*)    All other components within normal limits  HCG, QUANTITATIVE, PREGNANCY - Abnormal; Notable for the following components:   hCG, Beta Chain, Quant, S 41,825 (*)    All other components within normal limits  URINALYSIS, COMPLETE (UACMP) WITH MICROSCOPIC - Abnormal; Notable for the following components:   Color, Urine STRAW (*)    APPearance CLEAR (*)    Specific Gravity, Urine 1.003 (*)    Hgb urine dipstick SMALL (*)    All other components within normal limits  CBC  ABO/RH   ____________________________________________  EKG  None ____________________________________________  RADIOLOGY I, Shenea Giacobbe J, personally viewed and evaluated these images (plain radiographs) as part of my medical decision making, as well as  reviewing the written report by the radiologist.  ED MD interpretation: IUP 12 weeks 5 days, placenta previa  Official radiology report(s): US OB Comp Less 14 Wks  Result Date: 10/26/2020 CLINICAL DATA:  Vaginal bleeding, pregnant.  LMP 07/29/2020 EXAM: OBSTETRIC <14 WK ULTRASOUND TECHNIQUE: Transabdominal ultrasound was performed for evaluation of the gestation as well as the maternal uterus and adnexal regions. COMPARISON:  09/30/2020 FINDINGS: Intrauterine gestational sac: Present, single Yolk sac:  Not visualized Embryo:  Present, single Cardiac Activity: Present, regular Heart Rate: 132 bpm MSD: Appropriate given fetal size CRL:   64 mm   12 w 5 d                  [place] EDC: 05/05/2021 Subchorionic hemorrhage:  None visualized. Maternal uterus/adnexae: The uterus is anteverted. No intrauterine masses are seen. The  developing placenta is seen along the a lower uterine segment and appears to extend over the internal cervical os, best seen on image # 9, in keeping with a developing placenta previa. The cervix is closed, however, with a cervical length of roughly 3.5 cm. No free fluid within the pelvis. The maternal ovaries are unremarkable. No adnexal masses are seen. IMPRESSION: Developing placenta tissue appears to cover the internal cervical os in keeping with a developing placenta previa. This may migrate peripherally with progression of the gestational and, as such, serial follow-up imaging is recommended to document progression. Single living intrauterine gestation with an estimated gestational age of [redacted] weeks, 5 days. Appropriate interval growth since prior examination. Electronically Signed   By: Helyn Numbers MD   On: 10/26/2020 02:32    ____________________________________________   PROCEDURES  Procedure(s) performed (including Critical Care):  Procedures   ____________________________________________   INITIAL IMPRESSION / ASSESSMENT AND PLAN / ED COURSE  As part of my medical  decision making, I reviewed the following data within the electronic MEDICAL RECORD NUMBER Nursing notes reviewed and incorporated, Labs reviewed, Old chart reviewed, Radiograph reviewed and Notes from prior ED visits     25 year old G3, P2 approximately [redacted] weeks pregnant presenting for light vaginal spotting. Differential diagnosis includes, but is not limited to, ovarian cyst, ovarian torsion, acute appendicitis, diverticulitis, urinary tract infection/pyelonephritis, endometriosis, bowel obstruction, colitis, renal colic, gastroenteritis, hernia, fibroids, endometriosis, pregnancy related pain including ectopic pregnancy, etc.  Patient shows me a picture demonstrating pink-tinged toilet paper.  H/H unremarkable, she is O+.  Pelvic exam deferred secondary to placenta previa found on ultrasound.  Pelvic rest precautions given.  Patient will follow up closely with her OB next week.  Strict return precautions given.  Patient verbalizes understanding agrees with plan of care      ____________________________________________   FINAL CLINICAL IMPRESSION(S) / ED DIAGNOSES  Final diagnoses:  Vaginal bleeding in pregnancy  Placenta previa in first trimester  Threatened miscarriage     ED Discharge Orders    None       Note:  This document was prepared using Dragon voice recognition software and may include unintentional dictation errors.   Irean Hong, MD 10/26/20 [phone]

## 2020-10-26 NOTE — Discharge Instructions (Signed)
Do not have sexual intercourse, use tampons, douche until seen by your doctor.  Stay indoors out of the heat and drink plenty of fluids.  Return to the ER for worsening symptoms, soaking more than 1 pad per hour, fainting or other concerns.

## 2020-10-26 NOTE — ED Triage Notes (Signed)
Pt arrived via POV with reports of vaginal bleeding, pt is [redacted] weeks pregnant.  Pt reports the bleeding is spotting and denies any clots.  G-3 P-2. Pt goes to [facility].  Pt denies any pain or cramping.  Pt reports she is anxious.  Pt denies any early pregnancy complications with previous pregnancies.

## 2020-10-26 NOTE — ED Notes (Signed)
ED Provider at bedside. 

## 2020-10-27 ENCOUNTER — Telehealth: Payer: Self-pay | Admitting: Certified Nurse Midwife

## 2020-10-27 NOTE — Telephone Encounter (Signed)
Patient was seen in the ED and was diagnosed with placenta previa.  She stated that she was put on bed rest for a few days.  She states that she hasn't been spotting since the hospital.  Patient is asking if she needs to come in to be seen and what to expect from here on out.

## 2020-10-30 ENCOUNTER — Other Ambulatory Visit: Payer: Self-pay | Admitting: Certified Nurse Midwife

## 2020-10-30 ENCOUNTER — Other Ambulatory Visit: Payer: Self-pay

## 2020-10-30 ENCOUNTER — Ambulatory Visit
Admission: RE | Admit: 2020-10-30 | Discharge: 2020-10-30 | Disposition: A | Discharge status: Home / Self Care | Payer: Commercial Managed Care - PPO | Source: Ambulatory Visit | Attending: Certified Nurse Midwife | Admitting: Certified Nurse Midwife

## 2020-10-30 DIAGNOSIS — Z3200 Encounter for pregnancy test, result unknown: Secondary | ICD-10-CM

## 2020-11-03 ENCOUNTER — Ambulatory Visit (INDEPENDENT_AMBULATORY_CARE_PROVIDER_SITE_OTHER): Payer: Commercial Managed Care - PPO | Admitting: Obstetrics and Gynecology

## 2020-11-03 ENCOUNTER — Encounter: Payer: Self-pay | Admitting: Obstetrics and Gynecology

## 2020-11-03 ENCOUNTER — Other Ambulatory Visit: Payer: Self-pay

## 2020-11-03 VITALS — BP 112/70 | HR 79 | Ht 60.0 in | Wt 158.8 lb

## 2020-11-03 DIAGNOSIS — Z3A13 13 weeks gestation of pregnancy: Secondary | ICD-10-CM

## 2020-11-03 DIAGNOSIS — O0992 Supervision of high risk pregnancy, unspecified, second trimester: Secondary | ICD-10-CM | POA: Insufficient documentation

## 2020-11-03 NOTE — Progress Notes (Signed)
11/03/2020   Chief Complaint: Missed period  Transfer of Care Patient: yes  History of Present Illness: [name] is a 25 y.o. [id number] [redacted]w[redacted]d based on Patient's last menstrual period was 07/29/2020 (exact date). with an Estimated Date of Delivery: 05/05/21, with the above CC.   Her periods were: monthly She has Negative signs or symptoms of nausea/vomiting of pregnancy. She has Positive signs or symptoms of miscarriage or preterm labor. Had a subchorionic hemorrhage last week with vaginal bleeding, bleeding has now resolved.  She was not taking different medications around the time she conceived/early pregnancy. Since her LMP, she has not used alcohol Since her LMP, she has not used tobacco products Since her LMP, she has not used illegal drugs.    Current or past history of domestic violence. no  Infection History:  1. Since her LMP, she has not had a viral illness.  2. She admits to close contact with children on a regular basis     3. She has a history of chicken pox. She reports vaccination for chicken pox in the past. 4. Patient or partner has history of genital herpes  no 5. History of STI (GC, CT, HPV, syphilis, HIV)  no   6.  She does not live with someone with TB or TB exposed. 7. History of recent travel :  no 8. She identifies Negative Zika risk factors for her and her partner 7. There are not cats in the home in the home.  She understands that while pregnant she should not change cat litter.   Genetic Screening Questions: (Includes patient, baby's father, or anyone in either family)   1. Patient's age >/= 20 at [facility]  no 2. Thalassemia ([place], [place], Mediterranean, or Asian background): MCV<80  no 3. Neural tube defect (meningomyelocele, spina bifida, anencephaly)  no 4. Congenital heart defect  no  5. Down syndrome  no 6. Tay-Sachs (Jewish, [place]  no 7. Canavan's Disease  no 8. Sickle cell disease or trait (African)  no  9. Hemophilia or other blood  disorders  no  10. Muscular dystrophy  no  11. Cystic fibrosis  no  12. Huntington's Chorea  no  13. Mental retardation/autism  no 14. Other inherited genetic or chromosomal disorder  no 15. Maternal metabolic disorder (DM, PKU, etc)  no 16. Patient or FOB with a child with a birth defect not listed above no  16a. Patient or FOB with a birth defect themselves no 17. Recurrent pregnancy loss, or stillbirth  no  18. Any medications since LMP other than prenatal vitamins (include vitamins, supplements, OTC meds, drugs, alcohol)  no 19. Any other genetic/environmental exposure to discuss  no  ROS:  Review of Systems  Constitutional:  Negative for chills, fever, malaise/fatigue and weight loss.  HENT:  Negative for congestion, hearing loss and sinus pain.   Eyes:  Negative for blurred vision and double vision.  Respiratory:  Negative for cough, sputum production, shortness of breath and wheezing.   Cardiovascular:  Negative for chest pain, palpitations, orthopnea and leg swelling.  Gastrointestinal:  Negative for abdominal pain, constipation, diarrhea, nausea and vomiting.  Genitourinary:  Negative for dysuria, flank pain, frequency, hematuria and urgency.  Musculoskeletal:  Negative for back pain, falls and joint pain.  Skin:  Negative for itching and rash.  Neurological:  Negative for dizziness and headaches.  Psychiatric/Behavioral:  Negative for depression, substance abuse and suicidal ideas. The patient is not nervous/anxious.    OBGYN History: As per HPI. OB  History  Gravida Para Term Preterm AB Living  3 2 2  0 0 2  SAB IAB Ectopic Multiple Live Births  0 0 0 0 2    # Outcome Date GA Lbr Len/2nd Weight Sex Delivery Anes PTL Lv  3 Current           2 Term 02/25/19 [redacted]w[redacted]d 06:02 / 00:13 5 lb 4 oz (2.38 kg) F Vag-Spont EPI  LIV  1 Term 02/11/15 [redacted]w[redacted]d / 01:04 7 lb 8 oz (3.402 kg) M Vag-Spont EPI  LIV    Any issues with any prior pregnancies: yes, history of IUGR Any prior children  are healthy, doing well, without any problems or issues: yes Last pap smear 2020 NIL History of STIs: No   Past Medical History: Past Medical History:  Diagnosis Date   Anxiety    Anxiety and depression    Depressive disorder    GERD (gastroesophageal reflux disease)    Hyperthyroidism 2019   Possible Graves disease   IBS (irritable bowel syndrome)    Irritable bowel syndrome (IBS)    Thyroid disease     Past Surgical History: Past Surgical History:  Procedure Laterality Date   NO PAST SURGERIES      Family History:  Family History  Problem Relation Age of Onset   Breast cancer Other         mggm   Hypertension Mother    Hyperlipidemia Father    Thyroid disease Maternal Grandmother    Dementia Maternal Grandmother    Heart attack Maternal Grandfather    Diabetes Paternal Grandmother    Alcoholism Paternal Grandmother    Cancer Paternal Grandfather        pancreatic   Healthy Brother    Hypothyroidism Maternal Aunt    Hypothyroidism Maternal Aunt    Breast cancer Maternal Aunt        30s   She denies any female cancers, bleeding or blood clotting disorders.   Social History:  Social History   Socioeconomic History   Marital status: Single    Spouse name: 2020   Number of children: 1   Years of education: Not on file   Highest education level: Not on file  Occupational History   Not on file  Tobacco Use   Smoking status: Former    Pack years: 0.00    Types: Cigarettes    Quit date: 07/03/2018    Years since quitting: 2.3   Smokeless tobacco: Never  Vaping Use   Vaping Use: Some days  Substance and Sexual Activity   Alcohol use: Not Currently    Comment: 2 drinks/month   Drug use: Never    Types: Marijuana   Sexual activity: Yes    Comment: pt is trying to conceive  Other Topics Concern   Not on file  Social History Narrative   ** Merged History Encounter **       Social Determinants of Health   Financial Resource Strain: Not on file   Food Insecurity: Not on file  Transportation Needs: Not on file  Physical Activity: Not on file  Stress: Not on file  Social Connections: Not on file  Intimate Partner Violence: Not on file    Allergy: No Known Allergies  Current Outpatient Medications:  Current Outpatient Medications:    cetirizine (ZYRTEC) 10 MG tablet, Take 10 mg by mouth daily., Disp: , Rfl:    Doxylamine-Pyridoxine 10-10 MG TBEC, Take 1 tablet by mouth 4 (four) times daily. Day 1 &2:  2 tablet at bedtimeDay 3 : if symptoms persists 1 tablet am; 2 tablet at bedtimeDay 4: 1 tablet am, 1 tab afternoon, 2 tab at bedtime, Disp: 120 tablet, Rfl: 5   Prenatal Vit-Fe Fumarate-FA (PRENATAL VITAMINS PO), Take by mouth., Disp: , Rfl:    ranitidine (ZANTAC) 150 MG capsule, Take 150 mg by mouth 2 (two) times daily., Disp: , Rfl:    Physical Exam: Physical Exam Vitals and nursing note reviewed.  HENT:     Head: Normocephalic and atraumatic.  Eyes:     Pupils: Pupils are equal, round, and reactive to light.  Neck:     Thyroid: No thyromegaly.  Cardiovascular:     Rate and Rhythm: Normal rate and regular rhythm.  Pulmonary:     Effort: Pulmonary effort is normal.  Abdominal:     General: Bowel sounds are normal. There is no distension.     Palpations: Abdomen is soft.     Tenderness: There is no abdominal tenderness. There is no guarding or rebound.  Musculoskeletal:        General: Normal range of motion.     Cervical back: Normal range of motion and neck supple.  Skin:    General: Skin is warm and dry.  Neurological:     Mental Status: She is alert and oriented to person, place, and time.  Psychiatric:        Mood and Affect: Affect normal.        Judgment: Judgment normal.     Assessment: [name] is a 25 y.o. I6O0321 [redacted]w[redacted]d based on Patient's last menstrual period was 07/29/2020 (exact date). with an Estimated Date of Delivery: 05/05/21,  for prenatal care.  Plan:  1) Avoid alcoholic beverages. 2)  Patient encouraged not to smoke.  3) Discontinue the use of all non-medicinal drugs and chemicals.  4) Take prenatal vitamins daily.  5) Seatbelt use advised 6) Nutrition, food safety (fish, cheese advisories, and high nitrite foods) and exercise discussed. 7) Hospital and practice style delivering at [facility] discussed  8) Patient is asked about travel to areas at risk for the Zika virus, and counseled to avoid travel and exposure to mosquitoes or sexual partners who may have themselves been exposed to the virus. Testing is discussed, and will be ordered as appropriate.  9) Childbirth classes at [facility] advised 10) Genetic Screening, such as with 1st Trimester Screening, cell free fetal DNA, AFP testing, and Ultrasound, as well as with amniocentesis and CVS as appropriate, is discussed with patient. She plans to have genetic testing this pregnancy.  Hx hyperthyroidism, following with endocrinology.  Two moderate risks factors for Preeclampsia- discussed daily 81 mg Aspirin initiation between 12-16 weeks.  Encouraged Covid vaccination.   Problem list reviewed and updated.  I discussed the assessment and treatment plan with the patient. The patient was provided an opportunity to ask questions and all were answered. The patient agreed with the plan and demonstrated an understanding of the instructions.  Adelene Idler MD [facility], [facility] 11/03/2020 2:17 PM

## 2020-11-20 ENCOUNTER — Other Ambulatory Visit: Payer: Self-pay

## 2020-11-20 ENCOUNTER — Other Ambulatory Visit (INDEPENDENT_AMBULATORY_CARE_PROVIDER_SITE_OTHER): Payer: Commercial Managed Care - PPO

## 2020-11-20 ENCOUNTER — Encounter: Payer: Commercial Managed Care - PPO | Admitting: Certified Nurse Midwife

## 2020-11-20 DIAGNOSIS — E0590 Thyrotoxicosis, unspecified without thyrotoxic crisis or storm: Secondary | ICD-10-CM

## 2020-11-20 LAB — TSH: TSH: 0.01 u[IU]/mL — ABNORMAL LOW (ref 0.35–5.50)

## 2020-11-21 LAB — T4: T4, Total: 13 ug/dL — ABNORMAL HIGH (ref 5.1–11.9)

## 2020-11-21 LAB — T3: T3, Total: 153 ng/dL (ref 76–181)

## 2020-11-28 ENCOUNTER — Telehealth: Payer: Self-pay

## 2020-11-28 NOTE — Telephone Encounter (Signed)
Pt called requesting a antibiotic for a sinus infection, pt is at [employer].  I advised pt that she would have to be seen at an urgent care due to her not being seen since 09/2019 and pt is pregnant also.

## 2020-11-28 NOTE — Telephone Encounter (Signed)
Mailbox full unable to leave message.

## 2020-11-28 NOTE — Telephone Encounter (Signed)
Patient states PCP advised her to see a local Urgent Care. Requesting rx from OB prior to going to Urgent Care.

## 2020-11-28 NOTE — Telephone Encounter (Signed)
Patient is on vacation. Requesting rx for sinus infection. States she has a lot of yellow bloody snot coming out of her nose. She's feeling awful from it. Cb#[phone].

## 2020-11-28 NOTE — Telephone Encounter (Signed)
Spoke w/patient. Denies fever. Advised to contact PCP first. If unable to obtain rx from PCP, can call back. [facility] [address] is the pharmacy if she does need [place] to send rx. Also advised to Mucinex or Sudafed (behind counter) to help treat symptoms.

## 2020-12-01 ENCOUNTER — Other Ambulatory Visit: Payer: Self-pay

## 2020-12-01 ENCOUNTER — Ambulatory Visit (INDEPENDENT_AMBULATORY_CARE_PROVIDER_SITE_OTHER): Payer: Commercial Managed Care - PPO | Admitting: Obstetrics and Gynecology

## 2020-12-01 ENCOUNTER — Encounter: Payer: Self-pay | Admitting: Obstetrics and Gynecology

## 2020-12-01 VITALS — BP 100/70 | Wt 160.0 lb

## 2020-12-01 DIAGNOSIS — Z3A17 17 weeks gestation of pregnancy: Secondary | ICD-10-CM

## 2020-12-01 DIAGNOSIS — E0590 Thyrotoxicosis, unspecified without thyrotoxic crisis or storm: Secondary | ICD-10-CM

## 2020-12-01 DIAGNOSIS — O0992 Supervision of high risk pregnancy, unspecified, second trimester: Secondary | ICD-10-CM

## 2020-12-01 NOTE — Progress Notes (Signed)
Routine Prenatal Care Visit  Subjective  Debra Hinton is a 25 y.o. G3P2002 at [redacted]w[redacted]d being seen today for ongoing prenatal care.  She is currently monitored for the following issues for this high-risk pregnancy and has Irritable bowel syndrome with constipation; Generalized anxiety disorder; Vitamin D deficiency; Hyperthyroidism; GERD (gastroesophageal reflux disease); Graves disease; and Supervision of high risk pregnancy in second trimester on their problem list.  ----------------------------------------------------------------------------------- Patient reports no complaints.   Contractions: Not present. Vag. Bleeding: None.  Movement: Absent. Leaking Fluid denies.  ----------------------------------------------------------------------------------- The following portions of the patient's history were reviewed and updated as appropriate: allergies, current medications, past family history, past medical history, past social history, past surgical history and problem list. Problem list updated.  Objective  Blood pressure 100/70, weight 160 lb (72.6 kg), last menstrual period 07/29/2020, unknown if currently breastfeeding. Pregravid weight 166 lb (75.3 kg) Total Weight Gain -6 lb (-2.722 kg) Urinalysis: Urine Protein    Urine Glucose    Fetal Status: Fetal Heart Rate (bpm): 145   Movement: Absent     General:  Alert, oriented and cooperative. Patient is in no acute distress.  Skin: Skin is warm and dry. No rash noted.   Cardiovascular: Normal heart rate noted  Respiratory: Normal respiratory effort, no problems with respiration noted  Abdomen: Soft, gravid, appropriate for gestational age. Pain/Pressure: Absent     Pelvic:  Cervical exam deferred        Extremities: Normal range of motion.  Edema: None  Mental Status: Normal mood and affect. Normal behavior. Normal judgment and thought content.   Assessment   25 y.o. [id number] at [redacted]w[redacted]d by  05/05/2021, by Last Menstrual Period  presenting for routine prenatal visit  Plan   G3 Problems (from 10/13/20 to present)     Problem Noted Resolved   Supervision of high risk pregnancy in second trimester 11/03/2020 by Natale Milch, MD No   Overview Signed 11/03/2020  2:23 PM by Natale Milch, MD     Nursing Staff Provider  Office Location  [facility] Dating   LMP = 8wk [place]  Language  English Anatomy US    Flu Vaccine   Genetic Screen  NIPS: normal xx  TDaP vaccine    Hgb A1C or  GTT Early : nml Third trimester :   Covid Unvaccinated Hx Covid Jan 2022   LAB RESULTS   Rhogam   not needed Blood Type O POS  Feeding Plan  Antibody Negative (05/23 1126)  Contraception  Rubella 8.52 (05/23 1126)  Circumcision  RPR Non Reactive (05/23 1126)   Pediatrician   HBsAg Negative (05/23 1126)   Support Person  HIV Non Reactive (05/23 1126)  Prenatal Classes  Varicella  Immune    GBS  (For PCN allergy, check sensitivities)   BTL Consent     VBAC Consent  Pap  2020 NIL    Hgb Electro      CF      SMA               Hyperthyroidism 04/10/2018 by Lonzo Cloud, Konrad Dolores, MD No   Overview Signed 12/01/2020  4:31 PM by Conard Novak, MD    [ ]  followed by endocrine in [place]. No meds per endocrine at this time []  no TSI test that I could find [ ]  MFM consult and anatomy scan            Preterm labor symptoms and general obstetric precautions including but not limited to  vaginal bleeding, contractions, leaking of fluid and fetal movement were reviewed in detail with the patient. Please refer to After Visit Summary for other counseling recommendations.   - continue to follow endocrine - MFM u/s and consult on 7/19 re hyperthyroid. No TSI test that I could find. Tx per MFM recs  Return in about 4 weeks (around 12/29/2020) for Routine Prenatal Appointment.   Thomasene Mohair, MD, [facility], [facility] 12/01/2020 4:32 PM

## 2020-12-09 ENCOUNTER — Ambulatory Visit: Payer: Commercial Managed Care - PPO | Attending: Maternal & Fetal Medicine

## 2020-12-09 ENCOUNTER — Other Ambulatory Visit: Payer: Self-pay

## 2020-12-09 VITALS — HR 89 | Temp 98.2°F | Resp 18 | Ht 60.0 in | Wt 162.0 lb

## 2020-12-09 DIAGNOSIS — E0590 Thyrotoxicosis, unspecified without thyrotoxic crisis or storm: Secondary | ICD-10-CM

## 2020-12-09 DIAGNOSIS — O99282 Endocrine, nutritional and metabolic diseases complicating pregnancy, second trimester: Secondary | ICD-10-CM | POA: Diagnosis not present

## 2020-12-09 DIAGNOSIS — Z3A19 19 weeks gestation of pregnancy: Secondary | ICD-10-CM

## 2020-12-09 DIAGNOSIS — O0992 Supervision of high risk pregnancy, unspecified, second trimester: Secondary | ICD-10-CM

## 2020-12-11 ENCOUNTER — Ambulatory Visit (INDEPENDENT_AMBULATORY_CARE_PROVIDER_SITE_OTHER): Payer: Commercial Managed Care - PPO | Admitting: Obstetrics and Gynecology

## 2020-12-11 ENCOUNTER — Other Ambulatory Visit: Payer: Self-pay

## 2020-12-11 ENCOUNTER — Encounter: Payer: Self-pay | Admitting: Obstetrics and Gynecology

## 2020-12-11 VITALS — BP 128/70

## 2020-12-11 DIAGNOSIS — Z3A19 19 weeks gestation of pregnancy: Secondary | ICD-10-CM

## 2020-12-11 DIAGNOSIS — O0992 Supervision of high risk pregnancy, unspecified, second trimester: Secondary | ICD-10-CM

## 2020-12-11 NOTE — Progress Notes (Signed)
Routine Prenatal Care Visit  Subjective  Debra Hinton is a 25 y.o. G3P2002 at [redacted]w[redacted]d being seen today for ongoing prenatal care.  She is currently monitored for the following issues for this low-risk pregnancy and has Irritable bowel syndrome with constipation; Generalized anxiety disorder; Vitamin D deficiency; Hyperthyroidism; GERD (gastroesophageal reflux disease); Graves disease; and Supervision of high risk pregnancy in second trimester on their problem list.  ----------------------------------------------------------------------------------- Patient reports no complaints.   Contractions: Not present. Vag. Bleeding: None.  Movement: Present. Denies leaking of fluid.  ----------------------------------------------------------------------------------- The following portions of the patient's history were reviewed and updated as appropriate: allergies, current medications, past family history, past medical history, past social history, past surgical history and problem list. Problem list updated.   Objective  Blood pressure 128/70, last menstrual period 07/29/2020, unknown if currently breastfeeding. Pregravid weight 166 lb (75.3 kg) Total Weight Gain -4 lb (-1.814 kg) Urinalysis:      Fetal Status: Fetal Heart Rate (bpm): 145   Movement: Present     General:  Alert, oriented and cooperative. Patient is in no acute distress.  Skin: Skin is warm and dry. No rash noted.   Cardiovascular: Normal heart rate noted  Respiratory: Normal respiratory effort, no problems with respiration noted  Abdomen: Soft, gravid, appropriate for gestational age. Pain/Pressure: Absent     Pelvic:  Cervical exam deferred        Extremities: Normal range of motion.  Edema: None  Mental Status: Normal mood and affect. Normal behavior. Normal judgment and thought content.     Assessment   25 y.o. C1K4818 at [redacted]w[redacted]d by  05/05/2021, by Last Menstrual Period presenting for routine prenatal visit  Plan    G3 Problems (from 10/13/20 to present)     Problem Noted Resolved   Supervision of high risk pregnancy in second trimester 11/03/2020 by Natale Milch, MD No   Overview Addendum 12/11/2020  4:16 PM by Natale Milch, MD     Nursing Staff Provider  Office Location  [facility] Dating   LMP = 8wk [place]  Language  English Anatomy US  complete  Flu Vaccine   Genetic Screen  NIPS: normal xx  TDaP vaccine    Hgb A1C or  GTT Early : nml Third trimester :   Covid Unvaccinated Hx Covid Jan 2022   LAB RESULTS   Rhogam   not needed Blood Type O POS  Feeding Plan  Antibody Negative (05/23 1126)  Contraception vasectomy Rubella 8.52 (05/23 1126)  Circumcision  RPR Non Reactive (05/23 1126)   Pediatrician   HBsAg Negative (05/23 1126)   Support Person husband HIV Non Reactive (05/23 1126)  Prenatal Classes discussed Varicella  Immune    GBS  (For PCN allergy, check sensitivities)   BTL Consent     VBAC Consent  Pap  2020 NIL    Hgb Electro      CF      SMA              Hyperthyroidism 04/10/2018 by Lonzo Cloud, Konrad Dolores, MD No   Overview Signed 12/01/2020  4:31 PM by Conard Novak, MD    [ ]  followed by endocrine in [place]. No meds per endocrine at this time []  no TSI test that I could find [ ]  MFM consult and anatomy scan            Gestational age appropriate obstetric precautions including but not limited to vaginal bleeding, contractions, leaking of fluid and  fetal movement were reviewed in detail with the patient.    Return in about 4 weeks (around 01/08/2021) for ROB in person.  Natale Milch MD [facility], [facility] 12/11/2020, 4:16 PM

## 2020-12-11 NOTE — Patient Instructions (Signed)

## 2020-12-22 ENCOUNTER — Other Ambulatory Visit: Payer: Self-pay

## 2020-12-22 ENCOUNTER — Ambulatory Visit (INDEPENDENT_AMBULATORY_CARE_PROVIDER_SITE_OTHER): Payer: Commercial Managed Care - PPO | Admitting: Internal Medicine

## 2020-12-22 ENCOUNTER — Encounter: Payer: Self-pay | Admitting: Internal Medicine

## 2020-12-22 VITALS — BP 116/74 | HR 72 | Ht 60.0 in | Wt 166.4 lb

## 2020-12-22 DIAGNOSIS — E0590 Thyrotoxicosis, unspecified without thyrotoxic crisis or storm: Secondary | ICD-10-CM

## 2020-12-22 DIAGNOSIS — E0500 Thyrotoxicosis with diffuse goiter without thyrotoxic crisis or storm: Secondary | ICD-10-CM

## 2020-12-22 LAB — TSH: TSH: 0.23 u[IU]/mL — ABNORMAL LOW (ref 0.35–5.50)

## 2020-12-22 NOTE — Progress Notes (Signed)
Name: Debra Hinton  MRN/ DOB: [id number], [birth year]    Age/ Sex: 25 y.o., female     PCP: Carlean Jews, NP   Reason for Endocrinology Evaluation: .Hyperthyroidism     Initial Endocrinology Clinic Visit:  04/10/2018    PATIENT IDENTIFIER: Debra Hinton is a 25 y.o., female with a past medical history of IBS, GAD, and Graves' disease. She has followed with [facility] since 04/10/2018 for consultative assistance with management of her Hyperthyroidism .   HISTORICAL SUMMARY:  [name], 2019 with c/o constipation alternating with diarrhea as well as anxiety and weight gain. Her Labs revealed a suppressed  TSH at < 0.006 uIU/mL and elevated FT4 and T3. A thyroid ultrasound was unrevealing.    On her initial visit to our office in November, 2020 her TSH continued to be low but with normalization of her FT4. We decided to withhold therapy due to improvement in her TFT's    By 2020 she became pregnant and was started on PTU but was lost to follow-up until her return to our office in 10/2020 when she was [redacted] weeks pregnant at the time with a suppressed TSH 0.006 uIU/mL     FH with thyroid disease. SUBJECTIVE:     Today (12/22/2020):  Debra Hinton is here for a follow-up on Graves' disease during pregnancy.  She is currently at 20.6 weeks of gestation . She is accompanied by her two children.   She has gained some weight  She denies diarrhea /loose stools Denies tremors  Has occasional palpitations   PTU caused chemical taste     HISTORY:  Past Medical History:  Past Medical History:  Diagnosis Date   Anxiety    Anxiety and depression    Depressive disorder    GERD (gastroesophageal reflux disease)    Hyperthyroidism 2019   Possible Graves disease   IBS (irritable bowel syndrome)    Irritable bowel syndrome (IBS)    Thyroid disease    Past Surgical History:  Past Surgical  History:  Procedure Laterality Date   NO PAST SURGERIES     Social History:  reports that she quit smoking about 2 years ago. Her smoking use included cigarettes. She has never used smokeless tobacco. She reports previous alcohol use. She reports that she does not use drugs. Family History:  Family History  Problem Relation Age of Onset   Breast cancer Other         mggm   Hypertension Mother    Hyperlipidemia Father    Thyroid disease Maternal Grandmother    Dementia Maternal Grandmother    Heart attack Maternal Grandfather    Diabetes Paternal Grandmother    Alcoholism Paternal Grandmother    Cancer Paternal Grandfather        pancreatic   Healthy Brother    Hypothyroidism Maternal Aunt    Hypothyroidism Maternal Aunt    Breast cancer Maternal Aunt        30s     HOME MEDICATIONS: Allergies as of 12/22/2020   No Known Allergies      Medication List        Accurate as of December 22, 2020  3:27 PM. If you have any questions, ask your nurse or doctor.          aspirin 81 MG chewable tablet Chew 81 mg by mouth daily.   cetirizine 10 MG tablet Commonly known as:  ZYRTEC Take 10 mg by mouth daily.   Doxylamine-Pyridoxine 10-10 MG Tbec Take 1 tablet by mouth 4 (four) times daily. Day 1 &2: 2 tablet at bedtimeDay 3 : if symptoms persists 1 tablet am; 2 tablet at bedtimeDay 4: 1 tablet am, 1 tab afternoon, 2 tab at bedtime   PRENATAL VITAMINS PO Take by mouth.   ranitidine 150 MG capsule Commonly known as: ZANTAC Take 150 mg by mouth 2 (two) times daily.          OBJECTIVE:   PHYSICAL EXAM: VS: BP 116/74   Pulse 72   Ht 5' (1.524 m)   Wt 166 lb 6.4 oz (75.5 kg)   LMP 07/29/2020 (Exact Date)   SpO2 94%   BMI 32.50 kg/m    EXAM: General: Pt appears well and is in NAD  Neck: General: Supple without adenopathy. Thyroid: Thyroid size normal.  No goiter or nodules appreciated. No thyroid bruit.  Lungs: Clear with good BS bilat with no rales, rhonchi, or  wheezes  Heart: Auscultation: RRR.  Abdomen: Normoactive bowel sounds, soft, nontender, without masses or organomegaly palpable  Extremities:  BL LE: No pretibial edema normal ROM and strength.  Neuro: Cranial nerves: II - XII grossly intact  Motor: Normal strength throughout DTRs: 2+ and symmetric in UE without delay in relaxation phase  Mental Status: Judgment, insight: Intact Memory: Intact for recent and remote events Mood and affect: No depression, anxiety, or agitation     DATA REVIEWED:  Results for ELEA, HOLTZCLAW (MRN [id number]) as of 12/23/2020 08:37  Ref. Range 11/20/2020 13:57 12/22/2020 15:31  TSH Latest Ref Range: 0.35 - 5.50 uIU/mL 0.01 (L) 0.23 (L)  Triiodothyronine (T3) Latest Ref Range: 76 - 181 ng/dL 010   Thyroxine (T4) Latest Ref Range: 5.1 - 11.9 mcg/dL 93.2 (H) 35.5 (H)   Results for MORIYAH, BYINGTON (MRN [id number]) as of 12/22/2020 15:20  Ref. Range 04/10/2018 09:43  Thyrotropin Receptor Ab Latest Ref Range: <=16.0 % 30.4 (H)    ASSESSMENT / PLAN / RECOMMENDATIONS:   Hyperthyroidism During First Trimester:   - She is clinically euthyroid  - No local neck symptoms  - TFT's continue to improve  - The goal of treatment is to maintain persistent but mild hyperthyroidism in the mother in an attempt to prevent fetal hypothyroidism since the fetal thyroid is more sensitive to the action of thionamide therapy. Overtreatment of maternal hyperthyroidism can cause fetal goiter and primary hypothyroidism.  - PTU caused metallic taste int he past    Labs in 4 weeks  F/U in 8 weeks    Signed electronically by: Lyndle Herrlich, MD  [facility]  [facility] [address] [place], [place] [address] Phone: [phone] FAX: [phone]      CC: Carlean Jews, NP [address] [place] [address] Phone: [phone]  Fax: [phone]   Return to Endocrinology clinic as below: Future  Appointments  Date Time Provider Department Center  01/12/2021  4:10 PM Conard Novak, MD [facility] None  01/13/2021  2:00 PM [facility]

## 2020-12-23 LAB — T4: T4, Total: 12.7 ug/dL — ABNORMAL HIGH (ref 5.1–11.9)

## 2021-01-09 ENCOUNTER — Other Ambulatory Visit: Payer: Self-pay | Admitting: Maternal & Fetal Medicine

## 2021-01-09 DIAGNOSIS — O99212 Obesity complicating pregnancy, second trimester: Secondary | ICD-10-CM

## 2021-01-09 DIAGNOSIS — Z8759 Personal history of other complications of pregnancy, childbirth and the puerperium: Secondary | ICD-10-CM

## 2021-01-12 ENCOUNTER — Other Ambulatory Visit: Payer: Self-pay

## 2021-01-12 ENCOUNTER — Encounter: Payer: Self-pay | Admitting: Obstetrics and Gynecology

## 2021-01-12 ENCOUNTER — Ambulatory Visit (INDEPENDENT_AMBULATORY_CARE_PROVIDER_SITE_OTHER): Payer: Commercial Managed Care - PPO | Admitting: Obstetrics and Gynecology

## 2021-01-12 VITALS — BP 126/84 | Wt 171.0 lb

## 2021-01-12 DIAGNOSIS — Z3A23 23 weeks gestation of pregnancy: Secondary | ICD-10-CM

## 2021-01-12 DIAGNOSIS — Z131 Encounter for screening for diabetes mellitus: Secondary | ICD-10-CM

## 2021-01-12 DIAGNOSIS — E0500 Thyrotoxicosis with diffuse goiter without thyrotoxic crisis or storm: Secondary | ICD-10-CM

## 2021-01-12 DIAGNOSIS — Z113 Encounter for screening for infections with a predominantly sexual mode of transmission: Secondary | ICD-10-CM

## 2021-01-12 DIAGNOSIS — O0992 Supervision of high risk pregnancy, unspecified, second trimester: Secondary | ICD-10-CM

## 2021-01-12 NOTE — Progress Notes (Signed)
Routine Prenatal Care Visit  Subjective  Debra Hinton is a 25 y.o. G3P2002 at [redacted]w[redacted]d being seen today for ongoing prenatal care.  She is currently monitored for the following issues for this high-risk pregnancy and has Irritable bowel syndrome with constipation; Generalized anxiety disorder; Vitamin D deficiency; Hyperthyroidism; GERD (gastroesophageal reflux disease); Graves disease; and Supervision of high risk pregnancy in second trimester on their problem list.  ----------------------------------------------------------------------------------- Patient reports no complaints.   Contractions: Not present. Vag. Bleeding: None.  Movement: Present. Leaking Fluid denies.  ----------------------------------------------------------------------------------- The following portions of the patient's history were reviewed and updated as appropriate: allergies, current medications, past family history, past medical history, past social history, past surgical history and problem list. Problem list updated.  Objective  Blood pressure 126/84, weight 171 lb (77.6 kg), last menstrual period 07/29/2020, unknown if currently breastfeeding. Pregravid weight 166 lb (75.3 kg) Total Weight Gain 5 lb (2.268 kg) Urinalysis: Urine Protein    Urine Glucose    Fetal Status: Fetal Heart Rate (bpm): 145 Fundal Height: 24 cm Movement: Present     General:  Alert, oriented and cooperative. Patient is in no acute distress.  Skin: Skin is warm and dry. No rash noted.   Cardiovascular: Normal heart rate noted  Respiratory: Normal respiratory effort, no problems with respiration noted  Abdomen: Soft, gravid, appropriate for gestational age. Pain/Pressure: Absent     Pelvic:  Cervical exam deferred        Extremities: Normal range of motion.  Edema: None  Mental Status: Normal mood and affect. Normal behavior. Normal judgment and thought content.   Assessment   25 y.o. [id number] at [redacted]w[redacted]d by  05/05/2021, by Last  Menstrual Period presenting for routine prenatal visit  Plan   G3 Problems (from 10/13/20 to present)     Problem Noted Resolved   Supervision of high risk pregnancy in second trimester 11/03/2020 by Natale Milch, MD No   Overview Addendum 12/11/2020  4:22 PM by Natale Milch, MD     Nursing Staff Provider  Office Location  [facility] Dating   LMP = 8wk [place]  Language  English Anatomy US  complete  Flu Vaccine   Genetic Screen  NIPS: normal xx  TDaP vaccine    Hgb A1C or  GTT Early : nml Third trimester :   Covid Unvaccinated Hx Covid Jan 2022   LAB RESULTS   Rhogam   not needed Blood Type O POS  Feeding Plan  Breast Antibody Negative (05/23 1126)  Contraception vasectomy Rubella 8.52 (05/23 1126)  Circumcision  RPR Non Reactive (05/23 1126)   Pediatrician   HBsAg Negative (05/23 1126)   Support Person husband HIV Non Reactive (05/23 1126)  Prenatal Classes discussed Varicella  Immune    GBS  (For PCN allergy, check sensitivities)   BTL Consent     VBAC Consent  Pap  2020 NIL    Hgb Electro      CF      SMA              Hyperthyroidism 04/10/2018 by Lonzo Cloud, Konrad Dolores, MD No   Overview Signed 12/01/2020  4:31 PM by Conard Novak, MD    [ ]  followed by endocrine in [place]. No meds per endocrine at this time []  no TSI test that I could find [ ]  MFM consult and anatomy scan           Preterm labor symptoms and general obstetric precautions including but not limited  to vaginal bleeding, contractions, leaking of fluid and fetal movement were reviewed in detail with the patient. Please refer to After Visit Summary for other counseling recommendations.   Return in about 4 weeks (around 02/09/2021) for 28 wk labs with routine prenatal.   Thomasene Mohair, MD, [facility], [facility] 01/12/2021 4:42 PM

## 2021-01-13 ENCOUNTER — Other Ambulatory Visit: Payer: Self-pay

## 2021-01-13 ENCOUNTER — Ambulatory Visit: Payer: Commercial Managed Care - PPO | Attending: Obstetrics

## 2021-01-13 VITALS — BP 117/75 | HR 90 | Temp 97.8°F | Resp 18 | Ht 60.0 in | Wt 171.0 lb

## 2021-01-13 DIAGNOSIS — O99282 Endocrine, nutritional and metabolic diseases complicating pregnancy, second trimester: Secondary | ICD-10-CM | POA: Insufficient documentation

## 2021-01-13 DIAGNOSIS — O09292 Supervision of pregnancy with other poor reproductive or obstetric history, second trimester: Secondary | ICD-10-CM

## 2021-01-13 DIAGNOSIS — Z362 Encounter for other antenatal screening follow-up: Secondary | ICD-10-CM

## 2021-01-13 DIAGNOSIS — Z3A24 24 weeks gestation of pregnancy: Secondary | ICD-10-CM | POA: Insufficient documentation

## 2021-01-13 DIAGNOSIS — O99212 Obesity complicating pregnancy, second trimester: Secondary | ICD-10-CM

## 2021-01-13 DIAGNOSIS — O99342 Other mental disorders complicating pregnancy, second trimester: Secondary | ICD-10-CM | POA: Insufficient documentation

## 2021-01-13 DIAGNOSIS — O0992 Supervision of high risk pregnancy, unspecified, second trimester: Secondary | ICD-10-CM

## 2021-01-13 DIAGNOSIS — E0590 Thyrotoxicosis, unspecified without thyrotoxic crisis or storm: Secondary | ICD-10-CM | POA: Diagnosis not present

## 2021-01-13 DIAGNOSIS — Z8759 Personal history of other complications of pregnancy, childbirth and the puerperium: Secondary | ICD-10-CM | POA: Diagnosis not present

## 2021-01-13 DIAGNOSIS — F419 Anxiety disorder, unspecified: Secondary | ICD-10-CM | POA: Insufficient documentation

## 2021-01-20 ENCOUNTER — Other Ambulatory Visit: Payer: Self-pay

## 2021-01-20 ENCOUNTER — Other Ambulatory Visit (INDEPENDENT_AMBULATORY_CARE_PROVIDER_SITE_OTHER): Payer: Commercial Managed Care - PPO

## 2021-01-20 DIAGNOSIS — E0500 Thyrotoxicosis with diffuse goiter without thyrotoxic crisis or storm: Secondary | ICD-10-CM

## 2021-01-20 LAB — TSH: TSH: 0.25 u[IU]/mL — ABNORMAL LOW (ref 0.35–5.50)

## 2021-01-21 LAB — T4: T4, Total: 13.8 ug/dL — ABNORMAL HIGH (ref 5.1–11.9)

## 2021-02-09 ENCOUNTER — Encounter: Payer: Self-pay | Admitting: Obstetrics and Gynecology

## 2021-02-09 ENCOUNTER — Other Ambulatory Visit: Payer: Self-pay

## 2021-02-09 ENCOUNTER — Other Ambulatory Visit: Payer: Commercial Managed Care - PPO

## 2021-02-09 ENCOUNTER — Other Ambulatory Visit: Payer: Self-pay | Admitting: Obstetrics

## 2021-02-09 ENCOUNTER — Ambulatory Visit (INDEPENDENT_AMBULATORY_CARE_PROVIDER_SITE_OTHER): Payer: Commercial Managed Care - PPO | Admitting: Obstetrics and Gynecology

## 2021-02-09 VITALS — BP 118/74 | Wt 177.0 lb

## 2021-02-09 DIAGNOSIS — O0992 Supervision of high risk pregnancy, unspecified, second trimester: Secondary | ICD-10-CM

## 2021-02-09 DIAGNOSIS — Z8759 Personal history of other complications of pregnancy, childbirth and the puerperium: Secondary | ICD-10-CM | POA: Insufficient documentation

## 2021-02-09 DIAGNOSIS — O0993 Supervision of high risk pregnancy, unspecified, third trimester: Secondary | ICD-10-CM

## 2021-02-09 DIAGNOSIS — E0590 Thyrotoxicosis, unspecified without thyrotoxic crisis or storm: Secondary | ICD-10-CM

## 2021-02-09 DIAGNOSIS — E0500 Thyrotoxicosis with diffuse goiter without thyrotoxic crisis or storm: Secondary | ICD-10-CM

## 2021-02-09 DIAGNOSIS — Z131 Encounter for screening for diabetes mellitus: Secondary | ICD-10-CM

## 2021-02-09 DIAGNOSIS — O99213 Obesity complicating pregnancy, third trimester: Secondary | ICD-10-CM

## 2021-02-09 DIAGNOSIS — Z3A27 27 weeks gestation of pregnancy: Secondary | ICD-10-CM

## 2021-02-09 DIAGNOSIS — Z113 Encounter for screening for infections with a predominantly sexual mode of transmission: Secondary | ICD-10-CM

## 2021-02-09 DIAGNOSIS — F419 Anxiety disorder, unspecified: Secondary | ICD-10-CM

## 2021-02-09 DIAGNOSIS — O99283 Endocrine, nutritional and metabolic diseases complicating pregnancy, third trimester: Secondary | ICD-10-CM

## 2021-02-09 NOTE — Progress Notes (Signed)
Routine Prenatal Care Visit  Subjective  Debra Hinton is a 25 y.o. G3P2002 at [redacted]w[redacted]d being seen today for ongoing prenatal care.  She is currently monitored for the following issues for this high-risk pregnancy and has Irritable bowel syndrome with constipation; Generalized anxiety disorder; Vitamin D deficiency; Hyperthyroidism; GERD (gastroesophageal reflux disease); Graves disease; Supervision of high risk pregnancy in second trimester; and History of prior pregnancy with IUGR on their problem list.  ----------------------------------------------------------------------------------- Patient reports no complaints.   Contractions: Not present. Vag. Bleeding: None.  Movement: Present. Leaking Fluid denies.  ----------------------------------------------------------------------------------- The following portions of the patient's history were reviewed and updated as appropriate: allergies, current medications, past family history, past medical history, past social history, past surgical history and problem list. Problem list updated.  Objective  Blood pressure 118/74, weight 177 lb (80.3 kg), last menstrual period 07/29/2020, unknown if currently breastfeeding. Pregravid weight 166 lb (75.3 kg) Total Weight Gain 11 lb (4.99 kg) Urinalysis: Urine Protein    Urine Glucose    Fetal Status: Fetal Heart Rate (bpm): 135 Fundal Height: 27 cm Movement: Present     General:  Alert, oriented and cooperative. Patient is in no acute distress.  Skin: Skin is warm and dry. No rash noted.   Cardiovascular: Normal heart rate noted  Respiratory: Normal respiratory effort, no problems with respiration noted  Abdomen: Soft, gravid, appropriate for gestational age. Pain/Pressure: Absent     Pelvic:  Cervical exam deferred        Extremities: Normal range of motion.     Mental Status: Normal mood and affect. Normal behavior. Normal judgment and thought content.   Assessment   25 y.o. [id number] at [redacted]w[redacted]d  by  05/05/2021, by Last Menstrual Period presenting for routine prenatal visit  Plan   G3 Problems (from 10/13/20 to present)     Problem Noted Resolved   History of prior pregnancy with IUGR 02/09/2021 by Conard Novak, MD No   Overview Signed 02/09/2021 10:59 AM by Conard Novak, MD    - followed by MFM      Supervision of high risk pregnancy in second trimester 11/03/2020 by Natale Milch, MD No   Overview Addendum 12/11/2020  4:22 PM by Natale Milch, MD     Nursing Staff Provider  Office Location  [facility] Dating   LMP = 8wk [place]  Language  English Anatomy US  complete  Flu Vaccine   Genetic Screen  NIPS: normal xx  TDaP vaccine    Hgb A1C or  GTT Early : nml Third trimester :   Covid Unvaccinated Hx Covid Jan 2022   LAB RESULTS   Rhogam   not needed Blood Type O POS  Feeding Plan  Breast Antibody Negative (05/23 1126)  Contraception vasectomy Rubella 8.52 (05/23 1126)  Circumcision  RPR Non Reactive (05/23 1126)   Pediatrician   HBsAg Negative (05/23 1126)   Support Person husband HIV Non Reactive (05/23 1126)  Prenatal Classes discussed Varicella  Immune    GBS  (For PCN allergy, check sensitivities)   BTL Consent     VBAC Consent  Pap  2020 NIL    Hgb Electro      CF      SMA              Hyperthyroidism 04/10/2018 by Lonzo Cloud, Konrad Dolores, MD No   Overview Signed 12/01/2020  4:31 PM by Conard Novak, MD    [ ]  followed by endocrine in [place].  No meds per endocrine at this time []  no TSI test that I could find [ ]  MFM consult and anatomy scan           Preterm labor symptoms and general obstetric precautions including but not limited to vaginal bleeding, contractions, leaking of fluid and fetal movement were reviewed in detail with the patient. Please refer to After Visit Summary for other counseling recommendations.   - 28 wk labs today (Rh +) - MFM growth this week - endocrine  appt later this month.   Return in about 2  weeks (around 02/23/2021) for ROB.   , MD, 04/25/2021 OB/GYN, [facility] 02/09/2021 11:05 AM

## 2021-02-10 LAB — 28 WEEK RH+PANEL
Basophils Absolute: 0 10*3/uL (ref 0.0–0.2)
Basos: 0 %
EOS (ABSOLUTE): 0.2 10*3/uL (ref 0.0–0.4)
Eos: 3 %
Gestational Diabetes Screen: 107 mg/dL (ref 65–139)
HIV Screen 4th Generation wRfx: NONREACTIVE
Hematocrit: 38.7 % (ref 34.0–46.6)
Hemoglobin: 12.9 g/dL (ref 11.1–15.9)
Immature Grans (Abs): 0 10*3/uL (ref 0.0–0.1)
Immature Granulocytes: 0 %
Lymphocytes Absolute: 1.6 10*3/uL (ref 0.7–3.1)
Lymphs: 22 %
MCH: 30.3 pg (ref 26.6–33.0)
MCHC: 33.3 g/dL (ref 31.5–35.7)
MCV: 91 fL (ref 79–97)
Monocytes Absolute: 0.3 10*3/uL (ref 0.1–0.9)
Monocytes: 4 %
Neutrophils Absolute: 5.1 10*3/uL (ref 1.4–7.0)
Neutrophils: 71 %
Platelets: 202 10*3/uL (ref 150–450)
RBC: 4.26 x10E6/uL (ref 3.77–5.28)
RDW: 12.6 % (ref 11.7–15.4)
RPR Ser Ql: NONREACTIVE
WBC: 7.3 10*3/uL (ref 3.4–10.8)

## 2021-02-12 ENCOUNTER — Ambulatory Visit: Payer: Commercial Managed Care - PPO | Attending: Obstetrics and Gynecology

## 2021-02-12 ENCOUNTER — Other Ambulatory Visit: Payer: Self-pay

## 2021-02-12 DIAGNOSIS — Z3A28 28 weeks gestation of pregnancy: Secondary | ICD-10-CM | POA: Diagnosis not present

## 2021-02-12 DIAGNOSIS — Z8759 Personal history of other complications of pregnancy, childbirth and the puerperium: Secondary | ICD-10-CM | POA: Diagnosis not present

## 2021-02-12 DIAGNOSIS — O09293 Supervision of pregnancy with other poor reproductive or obstetric history, third trimester: Secondary | ICD-10-CM

## 2021-02-12 DIAGNOSIS — O99213 Obesity complicating pregnancy, third trimester: Secondary | ICD-10-CM | POA: Insufficient documentation

## 2021-02-12 DIAGNOSIS — F419 Anxiety disorder, unspecified: Secondary | ICD-10-CM | POA: Diagnosis not present

## 2021-02-12 DIAGNOSIS — E0590 Thyrotoxicosis, unspecified without thyrotoxic crisis or storm: Secondary | ICD-10-CM | POA: Diagnosis not present

## 2021-02-12 DIAGNOSIS — O99283 Endocrine, nutritional and metabolic diseases complicating pregnancy, third trimester: Secondary | ICD-10-CM | POA: Insufficient documentation

## 2021-02-12 DIAGNOSIS — O99343 Other mental disorders complicating pregnancy, third trimester: Secondary | ICD-10-CM | POA: Diagnosis not present

## 2021-02-20 ENCOUNTER — Telehealth (INDEPENDENT_AMBULATORY_CARE_PROVIDER_SITE_OTHER): Payer: Commercial Managed Care - PPO | Admitting: Internal Medicine

## 2021-02-20 ENCOUNTER — Encounter: Payer: Self-pay | Admitting: Internal Medicine

## 2021-02-20 ENCOUNTER — Other Ambulatory Visit: Payer: Self-pay

## 2021-02-20 VITALS — BP 117/87 | HR 99 | Ht 60.0 in

## 2021-02-20 DIAGNOSIS — E0500 Thyrotoxicosis with diffuse goiter without thyrotoxic crisis or storm: Secondary | ICD-10-CM

## 2021-02-20 DIAGNOSIS — E0590 Thyrotoxicosis, unspecified without thyrotoxic crisis or storm: Secondary | ICD-10-CM

## 2021-02-20 NOTE — Progress Notes (Signed)
Virtual Visit via Video Note  I connected with Debra Hinton on 02/20/21 at  8:50 AM by a video enabled telemedicine application and verified that I am speaking with the correct person using two identifiers.   I discussed the limitations of evaluation and management by telemedicine and the availability of in person appointments. The patient expressed understanding and agreed to proceed.  -Location of the patient :home  -Location of the provider : Office -The names of all persons participating in the telemedicine service : Pt and myself      Name: Debra Hinton  MRN/ DOB: [id number], [birth year]    Age/ Sex: 25 y.o., female     PCP: Carlean Jews, NP   Reason for Endocrinology Evaluation: .Hyperthyroidism     Initial Endocrinology Clinic Visit:  04/10/2018    PATIENT IDENTIFIER: Debra Hinton is a 25 y.o., female with a past medical history of IBS, GAD, and Graves' disease. She has followed with [facility] since 04/10/2018 for consultative assistance with management of her Hyperthyroidism .   HISTORICAL SUMMARY:  [name], 2019 with c/o constipation alternating with diarrhea as well as anxiety and weight gain. Her Labs revealed a suppressed  TSH at < 0.006 uIU/mL and elevated FT4 and T3. A thyroid ultrasound was unrevealing.    On her initial visit to our office in November, 2020 her TSH continued to be low but with normalization of her FT4. We decided to withhold therapy due to improvement in her TFT's    By 2020 she became pregnant and was started on PTU but was lost to follow-up until her return to our office in 10/2020 when she was [redacted] weeks pregnant at the time with a suppressed TSH 0.006 uIU/mL     FH with thyroid disease. SUBJECTIVE:     Today (02/20/2021):  [name] is here for a follow-up on Graves' disease during pregnancy.  She is currently at 29.3 weeks of  gestation .  EDD 05/05/2021    Pt endorses weight gain  Has occasional diarrhea alternating with constipations that she attributes to IBS Denies nausea  Denies local neck swelling  PTU caused chemical taste     HISTORY:  Past Medical History:  Past Medical History:  Diagnosis Date   Anxiety    Anxiety and depression    Depressive disorder    GERD (gastroesophageal reflux disease)    Hyperthyroidism 2019   Possible Graves disease   IBS (irritable bowel syndrome)    Irritable bowel syndrome (IBS)    Thyroid disease    Past Surgical History:  Past Surgical History:  Procedure Laterality Date   NO PAST SURGERIES     Social History:  reports that she quit smoking about 2 years ago. Her smoking use included cigarettes. She has never used smokeless tobacco. She reports that she does not currently use alcohol. She reports that she does not use drugs. Family History:  Family History  Problem Relation Age of Onset   Breast cancer Other         mggm   Hypertension Mother    Hyperlipidemia Father    Thyroid disease Maternal Grandmother    Dementia Maternal Grandmother    Heart attack Maternal Grandfather    Diabetes Paternal Grandmother    Alcoholism Paternal Grandmother    Cancer Paternal Grandfather        pancreatic   Healthy Brother    Hypothyroidism  Maternal Aunt    Hypothyroidism Maternal Aunt    Breast cancer Maternal Aunt        30s     HOME MEDICATIONS: Allergies as of 02/20/2021   No Known Allergies      Medication List        Accurate as of February 20, 2021  7:35 AM. If you have any questions, ask your nurse or doctor.          aspirin 81 MG chewable tablet Chew 81 mg by mouth daily.   cetirizine 10 MG tablet Commonly known as: ZYRTEC Take 10 mg by mouth daily.   Doxylamine-Pyridoxine 10-10 MG Tbec Take 1 tablet by mouth 4 (four) times daily. Day 1 &2: 2 tablet at bedtimeDay 3 : if symptoms persists 1 tablet am; 2 tablet at bedtimeDay 4:  1 tablet am, 1 tab afternoon, 2 tab at bedtime   PRENATAL VITAMINS PO Take by mouth.   ranitidine 150 MG capsule Commonly known as: ZANTAC Take 150 mg by mouth 2 (two) times daily.          OBJECTIVE:   PHYSICAL EXAM: VS: LMP 07/29/2020 (Exact Date)    EXAM: General: Pt appears well and is in NAD No local neck swelling noted      DATA REVIEWED: Results for LATICIA, VANNOSTRAND (MRN [id number]) as of 02/20/2021 08:55  Ref. Range 01/20/2021 10:23  TSH Latest Ref Range: 0.35 - 5.50 uIU/mL 0.25 (L)  Thyroxine (T4) Latest Ref Range: 5.1 - 11.9 mcg/dL 40.9 (H)    Results for MONCIA, ANNAS (MRN [id number]) as of 12/22/2020 15:20  Ref. Range 04/10/2018 09:43  Thyrotropin Receptor Ab Latest Ref Range: <=16.0 % 30.4 (H)    ASSESSMENT / PLAN / RECOMMENDATIONS:   Hyperthyroidism During 3rd Trimester   - She is clinically euthyroid  - No local neck symptoms  - She has not required any thionamide treatment during pregnancy - The goal of treatment is to maintain persistent but mild hyperthyroidism in the mother in an attempt to prevent fetal hypothyroidism since the fetal thyroid is more sensitive to the action of thionamide therapy. Overtreatment of maternal hyperthyroidism can cause fetal goiter and primary hypothyroidism.  - PTU caused metallic taste int he past  - She will have labs through her OB next week, she will notify me so I can look at these labs.  - She will also need labs in 4 weeks  - Follow up with me in 2 months ,but if she delivers before then , she will schedule a follow up at 6 weeks post delivery     Labs needed through OB TSH, Total T4 and Total T3 . Next week will need TRAB as well    I discussed the assessment and treatment plan with the patient. The patient was provided an opportunity to ask questions and all were answered. The patient agreed with the plan and demonstrated an understanding of the instructions.    Signed electronically by: Lyndle Herrlich, MD  [facility] Endocrinology  [facility] [address] [place], [place] [address] Phone: [phone] FAX: [phone]      CC: Carlean Jews, NP [address] [place] [address] Phone: [phone]  Fax: [phone]   Return to Endocrinology clinic as below: Future Appointments  Date Time Provider Department Center  02/20/2021  8:50 AM Autrey Human, Konrad Dolores, MD [facility] None  02/23/2021  9:50 AM Mirna Mires, CNM [facility] None  03/12/2021 11:30 AM [facility]  03/19/2021 10:00 AM [facility]  03/26/2021 11:00 AM [facility]  04/02/2021 11:00 AM [facility]

## 2021-02-23 ENCOUNTER — Ambulatory Visit (INDEPENDENT_AMBULATORY_CARE_PROVIDER_SITE_OTHER): Payer: Commercial Managed Care - PPO | Admitting: Obstetrics

## 2021-02-23 ENCOUNTER — Other Ambulatory Visit: Payer: Self-pay

## 2021-02-23 VITALS — BP 118/78 | Wt 183.0 lb

## 2021-02-23 DIAGNOSIS — Z23 Encounter for immunization: Secondary | ICD-10-CM

## 2021-02-23 DIAGNOSIS — Z8759 Personal history of other complications of pregnancy, childbirth and the puerperium: Secondary | ICD-10-CM

## 2021-02-23 DIAGNOSIS — E0590 Thyrotoxicosis, unspecified without thyrotoxic crisis or storm: Secondary | ICD-10-CM

## 2021-02-23 DIAGNOSIS — O0992 Supervision of high risk pregnancy, unspecified, second trimester: Secondary | ICD-10-CM

## 2021-02-23 DIAGNOSIS — Z3A29 29 weeks gestation of pregnancy: Secondary | ICD-10-CM

## 2021-02-23 DIAGNOSIS — O0993 Supervision of high risk pregnancy, unspecified, third trimester: Secondary | ICD-10-CM

## 2021-02-23 LAB — POCT URINALYSIS DIPSTICK OB
Glucose, UA: NEGATIVE
POC,PROTEIN,UA: NEGATIVE

## 2021-02-23 NOTE — Progress Notes (Addendum)
Routine Prenatal Care Visit  Subjective  Debra Hinton is a 25 y.o. G3P2002 at [redacted]w[redacted]d being seen today for ongoing prenatal care.  She is currently monitored for the following issues for this high-risk pregnancy and has Irritable bowel syndrome with constipation; Generalized anxiety disorder; Vitamin D deficiency; Hyperthyroidism; GERD (gastroesophageal reflux disease); Graves disease; Supervision of high risk pregnancy in second trimester; and History of prior pregnancy with IUGR on their problem list.  ----------------------------------------------------------------------------------- Patient reports no complaints.  She has recently seen her endocrinologist , who documented she would have labs today-  Contractions: Irritability. Vag. Bleeding: None.  Movement: Present. Leaking Fluid denies.  ----------------------------------------------------------------------------------- The following portions of the patient's history were reviewed and updated as appropriate: allergies, current medications, past family history, past medical history, past social history, past surgical history and problem list. Problem list updated.  Objective  Blood pressure 118/78, weight 183 lb (83 kg), last menstrual period 07/29/2020, unknown if currently breastfeeding. Pregravid weight 166 lb (75.3 kg) Total Weight Gain 17 lb (7.711 kg) Urinalysis: Urine Protein    Urine Glucose    Fetal Status:     Movement: Present     General:  Alert, oriented and cooperative. Patient is in no acute distress.  Skin: Skin is warm and dry. No rash noted.   Cardiovascular: Normal heart rate noted  Respiratory: Normal respiratory effort, no problems with respiration noted  Abdomen: Soft, gravid, appropriate for gestational age. Pain/Pressure: Absent     Pelvic:  Cervical exam deferred        Extremities: Normal range of motion.     Mental Status: Normal mood and affect. Normal behavior. Normal judgment and thought content.    Assessment   25 y.o. [id number] at [redacted]w[redacted]d by  05/05/2021, by Last Menstrual Period presenting for routine prenatal visit Hyperthyroidism  Plan   G3 Problems (from 10/13/20 to present)     Problem Noted Resolved   History of prior pregnancy with IUGR 02/09/2021 by Conard Novak, MD No   Overview Signed 02/09/2021 10:59 AM by Conard Novak, MD    - followed by MFM      Supervision of high risk pregnancy in second trimester 11/03/2020 by Natale Milch, MD No   Overview Addendum 02/23/2021 10:04 AM by Mirna Mires, CNM     Nursing Staff Provider  Office Location  [facility] Dating   LMP = 8wk [place]  Language  English Anatomy US  complete  Flu Vaccine   Genetic Screen  NIPS: normal xx  TDaP vaccine   02/23/2021 Hgb A1C or  GTT Early : nml Third trimester : 107  Covid Unvaccinated Hx Covid Jan 2022   LAB RESULTS   Rhogam   not needed Blood Type O POS  Feeding Plan  Breast Antibody Negative (05/23 1126)  Contraception vasectomy Rubella 8.52 (05/23 1126)  Circumcision  RPR Non Reactive (05/23 1126)   Pediatrician   HBsAg Negative (05/23 1126)   Support Person husband HIV Non Reactive (05/23 1126)  Prenatal Classes discussed Varicella  Immune    GBS  (For PCN allergy, check sensitivities)   BTL Consent     VBAC Consent  Pap  2020 NIL    Hgb Electro      CF      SMA               Hyperthyroidism 04/10/2018 by Lonzo Cloud, Konrad Dolores, MD No   Overview Signed 12/01/2020  4:31 PM by Conard Novak, MD    [ ]   followed by endocrine in [place]. No meds per endocrine at this time []  no TSI test that I could find [ x] MFM consult and anatomy scan           Preterm labor symptoms and general obstetric precautions including but not limited to vaginal bleeding, contractions, leaking of fluid and fetal movement were reviewed in detail with the patient. Please refer to After Visit Summary for other counseling recommendations.  Per her endocrinologist, will draw TSH  , T3 and T4 today  No follow-ups on file.  , CNM  02/23/2021 10:05 AM

## 2021-02-23 NOTE — Progress Notes (Signed)
r 

## 2021-02-24 LAB — TSH+FREE T4
Free T4: 1.18 ng/dL (ref 0.82–1.77)
TSH: 0.291 u[IU]/mL — ABNORMAL LOW (ref 0.450–4.500)

## 2021-02-26 ENCOUNTER — Other Ambulatory Visit: Payer: Self-pay | Admitting: Obstetrics and Gynecology

## 2021-02-26 DIAGNOSIS — O99213 Obesity complicating pregnancy, third trimester: Secondary | ICD-10-CM

## 2021-02-26 DIAGNOSIS — F419 Anxiety disorder, unspecified: Secondary | ICD-10-CM

## 2021-02-26 DIAGNOSIS — Z8759 Personal history of other complications of pregnancy, childbirth and the puerperium: Secondary | ICD-10-CM

## 2021-02-26 DIAGNOSIS — E0590 Thyrotoxicosis, unspecified without thyrotoxic crisis or storm: Secondary | ICD-10-CM

## 2021-03-09 ENCOUNTER — Ambulatory Visit (INDEPENDENT_AMBULATORY_CARE_PROVIDER_SITE_OTHER): Payer: Commercial Managed Care - PPO | Admitting: Obstetrics & Gynecology

## 2021-03-09 ENCOUNTER — Encounter: Payer: Self-pay | Admitting: Obstetrics & Gynecology

## 2021-03-09 ENCOUNTER — Other Ambulatory Visit: Payer: Self-pay

## 2021-03-09 VITALS — BP 120/80 | Wt 181.0 lb

## 2021-03-09 DIAGNOSIS — O0992 Supervision of high risk pregnancy, unspecified, second trimester: Secondary | ICD-10-CM

## 2021-03-09 DIAGNOSIS — E0590 Thyrotoxicosis, unspecified without thyrotoxic crisis or storm: Secondary | ICD-10-CM

## 2021-03-09 DIAGNOSIS — O0993 Supervision of high risk pregnancy, unspecified, third trimester: Secondary | ICD-10-CM

## 2021-03-09 DIAGNOSIS — Z3A31 31 weeks gestation of pregnancy: Secondary | ICD-10-CM

## 2021-03-09 LAB — POCT URINALYSIS DIPSTICK OB
Glucose, UA: NEGATIVE
POC,PROTEIN,UA: NEGATIVE

## 2021-03-09 NOTE — Progress Notes (Signed)
  Subjective  Fetal Movement? yes Contractions? no Leaking Fluid? no Vaginal Bleeding? no  Thyroid labs today.  Followed by Dr Lonzo Cloud Sees MFM for growth [place], soon BPP weekly  Objective  BP 120/80   Wt 181 lb (82.1 kg)   LMP 07/29/2020 (Exact Date)   BMI 35.35 kg/m  General: NAD Pumonary: no increased work of breathing Abdomen: gravid, non-tender Extremities: no edema Psychiatric: mood appropriate, affect full  Assessment  25 y.o. [id number] at [redacted]w[redacted]d by  05/05/2021, by Last Menstrual Period presenting for routine prenatal visit  Plan   Problem List Items Addressed This Visit      Endocrine   Hyperthyroidism   Relevant Orders   T4   T3   TSH     Other   Supervision of high risk pregnancy in second trimester  Other Visit Diagnoses    Supervision of high risk pregnancy in third trimester    -  Primary   [redacted] weeks gestation of pregnancy        Labs today, Dr Lonzo Cloud of Endocrinology to determine med need MFM w [place] and BPP NST weekly here starting >32 weeks Plans to breast feed Plans vasec Declines flu shot  G3 Problems (from 10/13/20 to present)    Problem Noted Resolved   History of prior pregnancy with IUGR 02/09/2021 by Conard Novak, MD No   Overview Signed 02/09/2021 10:59 AM by Conard Novak, MD    - followed by MFM      Supervision of high risk pregnancy in second trimester 11/03/2020 by Natale Milch, MD No   Overview Addendum 03/09/2021 10:56 AM by Nadara Mustard, MD     Nursing Staff Provider  Office Location  [facility] Dating   LMP = 8wk [place]  Language  English Anatomy US  complete  Flu Vaccine  Declines Genetic Screen  NIPS: normal xx  TDaP vaccine   02/23/2021 Hgb A1C or  GTT Early : nml Third trimester : 107  Covid Unvaccinated Hx Covid Jan 2022   LAB RESULTS   Rhogam   not needed Blood Type O POS  Feeding Plan  Breast Antibody Negative (05/23 1126)  Contraception vasectomy Rubella 8.52 (05/23 1126)  Circumcision N\a RPR  Non Reactive (05/23 1126)   Pediatrician   HBsAg Negative (05/23 1126)   Support Person husband HIV Non Reactive (05/23 1126)  Prenatal Classes discussed Varicella  Immune    GBS  (For PCN allergy, check sensitivities)   BTL Consent no    VBAC Consent n/a Pap  2020 NIL         Hyperthyroidism 04/10/2018 by Lonzo Cloud, Konrad Dolores, MD No   Overview Signed 12/01/2020  4:31 PM by Conard Novak, MD    [x ] followed by endocrine in [place]. No meds per endocrine at this time [x ] MFM consult and anatomy scan        Annamarie Major, MD, [facility],  [facility] 03/09/2021  10:57 AM

## 2021-03-09 NOTE — Addendum Note (Signed)
Addended by: Cornelius Moras D on: 03/09/2021 11:20 AM   Modules accepted: Orders

## 2021-03-09 NOTE — Patient Instructions (Signed)

## 2021-03-10 ENCOUNTER — Other Ambulatory Visit: Payer: Self-pay

## 2021-03-10 LAB — T3: T3, Total: 169 ng/dL (ref 71–180)

## 2021-03-10 LAB — T4: T4, Total: 14.7 ug/dL — ABNORMAL HIGH (ref 4.5–12.0)

## 2021-03-10 LAB — TSH: TSH: 0.408 u[IU]/mL — ABNORMAL LOW (ref 0.450–4.500)

## 2021-03-12 ENCOUNTER — Ambulatory Visit: Payer: Commercial Managed Care - PPO | Attending: Maternal & Fetal Medicine

## 2021-03-12 ENCOUNTER — Other Ambulatory Visit: Payer: Self-pay

## 2021-03-12 DIAGNOSIS — E0590 Thyrotoxicosis, unspecified without thyrotoxic crisis or storm: Secondary | ICD-10-CM | POA: Insufficient documentation

## 2021-03-12 DIAGNOSIS — O99283 Endocrine, nutritional and metabolic diseases complicating pregnancy, third trimester: Secondary | ICD-10-CM | POA: Diagnosis present

## 2021-03-12 DIAGNOSIS — O99343 Other mental disorders complicating pregnancy, third trimester: Secondary | ICD-10-CM | POA: Diagnosis not present

## 2021-03-12 DIAGNOSIS — F419 Anxiety disorder, unspecified: Secondary | ICD-10-CM | POA: Diagnosis not present

## 2021-03-12 DIAGNOSIS — O99213 Obesity complicating pregnancy, third trimester: Secondary | ICD-10-CM | POA: Insufficient documentation

## 2021-03-12 DIAGNOSIS — Z3A32 32 weeks gestation of pregnancy: Secondary | ICD-10-CM | POA: Insufficient documentation

## 2021-03-12 DIAGNOSIS — O09293 Supervision of pregnancy with other poor reproductive or obstetric history, third trimester: Secondary | ICD-10-CM | POA: Diagnosis not present

## 2021-03-12 DIAGNOSIS — Z8759 Personal history of other complications of pregnancy, childbirth and the puerperium: Secondary | ICD-10-CM

## 2021-03-16 ENCOUNTER — Other Ambulatory Visit: Payer: Self-pay

## 2021-03-16 ENCOUNTER — Ambulatory Visit (INDEPENDENT_AMBULATORY_CARE_PROVIDER_SITE_OTHER): Payer: Commercial Managed Care - PPO | Admitting: Obstetrics & Gynecology

## 2021-03-16 ENCOUNTER — Encounter: Payer: Self-pay | Admitting: Obstetrics & Gynecology

## 2021-03-16 VITALS — BP 100/70 | Wt 181.0 lb

## 2021-03-16 DIAGNOSIS — E0590 Thyrotoxicosis, unspecified without thyrotoxic crisis or storm: Secondary | ICD-10-CM

## 2021-03-16 DIAGNOSIS — O0993 Supervision of high risk pregnancy, unspecified, third trimester: Secondary | ICD-10-CM

## 2021-03-16 DIAGNOSIS — Z3A32 32 weeks gestation of pregnancy: Secondary | ICD-10-CM

## 2021-03-16 DIAGNOSIS — Z8759 Personal history of other complications of pregnancy, childbirth and the puerperium: Secondary | ICD-10-CM

## 2021-03-16 LAB — POCT URINALYSIS DIPSTICK OB
Glucose, UA: NEGATIVE
POC,PROTEIN,UA: NEGATIVE

## 2021-03-16 NOTE — Patient Instructions (Signed)

## 2021-03-16 NOTE — Progress Notes (Signed)
  Subjective  Fetal Movement? yes Contractions? no Leaking Fluid? no Vaginal Bleeding? no No new sx's of concern  Objective  BP 100/70   Wt 181 lb (82.1 kg)   LMP 07/29/2020 (Exact Date)   BMI 35.35 kg/m  General: NAD Pumonary: no increased work of breathing Abdomen: gravid, non-tender Extremities: no edema Psychiatric: mood appropriate, affect full  Assessment  25 y.o. [id number] at [redacted]w[redacted]d by  05/05/2021, by Last Menstrual Period presenting for routine prenatal visit  Plan   Problem List Items Addressed This Visit      Endocrine   Hyperthyroidism     Other   History of prior pregnancy with IUGR  Other Visit Diagnoses    Supervision of high risk pregnancy in third trimester    -  Primary   Relevant Orders   POC Urinalysis Dipstick OB (Completed)   [redacted] weeks gestation of pregnancy        Weekly NST, BPP at MFM (prior IUGR) No thy meds at this time, per endocrinology  PNV, [facility] IOL sch for 11/25 due to IUGR  G3 Problems (from 10/13/20 to present)    Problem     History of prior pregnancy with IUGR     Overview Signed 02/09/2021 10:59 AM by Conard Novak, MD    - followed by MFM      Supervision of high risk pregnancy in second trimester     Overview Addendum 03/09/2021 10:56 AM by Nadara Mustard, MD     Nursing Staff Provider  Office Location  [facility] Dating   LMP = 8wk [place]  Language  English Anatomy US  complete  Flu Vaccine  Declines Genetic Screen  NIPS: normal xx  TDaP vaccine   02/23/2021 Hgb A1C or  GTT Early : nml Third trimester : 107  Covid Unvaccinated Hx Covid Jan 2022   LAB RESULTS   Rhogam   not needed Blood Type O POS  Feeding Plan  Breast Antibody Negative (05/23 1126)  Contraception vasectomy Rubella 8.52 (05/23 1126)  Circumcision N\a RPR Non Reactive (05/23 1126)   Pediatrician   HBsAg Negative (05/23 1126)   Support Person husband HIV Non Reactive (05/23 1126)  Prenatal Classes discussed Varicella  Immune    GBS  (For PCN allergy,  check sensitivities)   BTL Consent no    VBAC Consent n/a Pap  2020 NIL         Hyperthyroidism     Overview Signed 12/01/2020  4:31 PM by Conard Novak, MD    [x ] followed by endocrine in [place]. No meds per endocrine at this time [[x ] MFM consult and anatomy scan          Annamarie Major, MD, [facility], [facility] 03/16/2021  9:22 AM

## 2021-03-19 ENCOUNTER — Other Ambulatory Visit: Payer: Self-pay

## 2021-03-19 ENCOUNTER — Ambulatory Visit: Payer: Commercial Managed Care - PPO | Attending: Maternal & Fetal Medicine

## 2021-03-19 VITALS — BP 124/82 | HR 80 | Temp 97.7°F | Wt 182.0 lb

## 2021-03-19 DIAGNOSIS — O99343 Other mental disorders complicating pregnancy, third trimester: Secondary | ICD-10-CM

## 2021-03-19 DIAGNOSIS — Z3A33 33 weeks gestation of pregnancy: Secondary | ICD-10-CM | POA: Insufficient documentation

## 2021-03-19 DIAGNOSIS — O99213 Obesity complicating pregnancy, third trimester: Secondary | ICD-10-CM | POA: Diagnosis not present

## 2021-03-19 DIAGNOSIS — O09293 Supervision of pregnancy with other poor reproductive or obstetric history, third trimester: Secondary | ICD-10-CM | POA: Diagnosis not present

## 2021-03-19 DIAGNOSIS — E0590 Thyrotoxicosis, unspecified without thyrotoxic crisis or storm: Secondary | ICD-10-CM | POA: Diagnosis not present

## 2021-03-19 DIAGNOSIS — O99283 Endocrine, nutritional and metabolic diseases complicating pregnancy, third trimester: Secondary | ICD-10-CM | POA: Diagnosis not present

## 2021-03-19 DIAGNOSIS — F419 Anxiety disorder, unspecified: Secondary | ICD-10-CM | POA: Diagnosis not present

## 2021-03-19 DIAGNOSIS — Z8759 Personal history of other complications of pregnancy, childbirth and the puerperium: Secondary | ICD-10-CM | POA: Diagnosis not present

## 2021-03-24 ENCOUNTER — Other Ambulatory Visit: Payer: Self-pay

## 2021-03-24 DIAGNOSIS — E0590 Thyrotoxicosis, unspecified without thyrotoxic crisis or storm: Secondary | ICD-10-CM

## 2021-03-24 DIAGNOSIS — O09293 Supervision of pregnancy with other poor reproductive or obstetric history, third trimester: Secondary | ICD-10-CM

## 2021-03-24 DIAGNOSIS — F419 Anxiety disorder, unspecified: Secondary | ICD-10-CM

## 2021-03-24 DIAGNOSIS — F32A Depression, unspecified: Secondary | ICD-10-CM

## 2021-03-26 ENCOUNTER — Encounter: Payer: Self-pay | Admitting: Obstetrics and Gynecology

## 2021-03-26 ENCOUNTER — Ambulatory Visit: Payer: Commercial Managed Care - PPO | Attending: Obstetrics

## 2021-03-26 ENCOUNTER — Other Ambulatory Visit: Payer: Self-pay

## 2021-03-26 ENCOUNTER — Ambulatory Visit (INDEPENDENT_AMBULATORY_CARE_PROVIDER_SITE_OTHER): Payer: Commercial Managed Care - PPO | Admitting: Obstetrics and Gynecology

## 2021-03-26 VITALS — BP 120/84 | Wt 183.0 lb

## 2021-03-26 DIAGNOSIS — O0993 Supervision of high risk pregnancy, unspecified, third trimester: Secondary | ICD-10-CM | POA: Diagnosis not present

## 2021-03-26 DIAGNOSIS — O99213 Obesity complicating pregnancy, third trimester: Secondary | ICD-10-CM | POA: Insufficient documentation

## 2021-03-26 DIAGNOSIS — Z8759 Personal history of other complications of pregnancy, childbirth and the puerperium: Secondary | ICD-10-CM | POA: Diagnosis not present

## 2021-03-26 DIAGNOSIS — F419 Anxiety disorder, unspecified: Secondary | ICD-10-CM

## 2021-03-26 DIAGNOSIS — O99283 Endocrine, nutritional and metabolic diseases complicating pregnancy, third trimester: Secondary | ICD-10-CM | POA: Insufficient documentation

## 2021-03-26 DIAGNOSIS — E0590 Thyrotoxicosis, unspecified without thyrotoxic crisis or storm: Secondary | ICD-10-CM

## 2021-03-26 DIAGNOSIS — Z3A34 34 weeks gestation of pregnancy: Secondary | ICD-10-CM

## 2021-03-26 DIAGNOSIS — E669 Obesity, unspecified: Secondary | ICD-10-CM

## 2021-03-26 DIAGNOSIS — O09293 Supervision of pregnancy with other poor reproductive or obstetric history, third trimester: Secondary | ICD-10-CM | POA: Diagnosis not present

## 2021-03-26 DIAGNOSIS — O99343 Other mental disorders complicating pregnancy, third trimester: Secondary | ICD-10-CM | POA: Diagnosis not present

## 2021-03-26 DIAGNOSIS — F32A Depression, unspecified: Secondary | ICD-10-CM

## 2021-03-26 NOTE — Progress Notes (Signed)
Routine Prenatal Care Visit  Subjective  Tamanna Whitson is a 25 y.o. G3P2002 at [redacted]w[redacted]d being seen today for ongoing prenatal care.  She is currently monitored for the following issues for this high-risk pregnancy and has Irritable bowel syndrome with constipation; Generalized anxiety disorder; Vitamin D deficiency; Hyperthyroidism; GERD (gastroesophageal reflux disease); Graves disease; Supervision of high risk pregnancy in second trimester; and History of prior pregnancy with IUGR on their problem list.  ----------------------------------------------------------------------------------- Patient reports no complaints.   Contractions: Not present. Vag. Bleeding: None.  Movement: Present. Leaking Fluid denies.  ----------------------------------------------------------------------------------- The following portions of the patient's history were reviewed and updated as appropriate: allergies, current medications, past family history, past medical history, past social history, past surgical history and problem list. Problem list updated.  Objective  Blood pressure 120/84, weight 183 lb (83 kg), last menstrual period 07/29/2020, unknown if currently breastfeeding. Pregravid weight 166 lb (75.3 kg) Total Weight Gain 17 lb (7.711 kg) Urinalysis: Urine Protein    Urine Glucose    Fetal Status: Fetal Heart Rate (bpm): 140   Movement: Present     General:  Alert, oriented and cooperative. Patient is in no acute distress.  Skin: Skin is warm and dry. No rash noted.   Cardiovascular: Normal heart rate noted  Respiratory: Normal respiratory effort, no problems with respiration noted  Abdomen: Soft, gravid, appropriate for gestational age. Pain/Pressure: Absent     Pelvic:  Cervical exam deferred        Extremities: Normal range of motion.  Edema: None  Mental Status: Normal mood and affect. Normal behavior. Normal judgment and thought content.   NST: Baseline FHR: 140 beats/min Variability:  moderate Accelerations: present Decelerations: absent Tocometry: not done  Interpretation:  INDICATIONS: history of FGR, hyperthyroidism RESULTS:  A NST procedure was performed with FHR monitoring and a normal baseline established, appropriate time of 20-40 minutes of evaluation, and accels >2 seen w 15x15 characteristics.  Results show a REACTIVE NST.    Assessment   25 y.o. [id number] at [redacted]w[redacted]d by  05/05/2021, by Last Menstrual Period presenting for routine prenatal visit  Plan   G3 Problems (from 10/13/20 to present)     Problem Noted Resolved   History of prior pregnancy with IUGR 02/09/2021 by Conard Novak, MD No   Overview Signed 02/09/2021 10:59 AM by Conard Novak, MD    - followed by MFM      Supervision of high risk pregnancy in second trimester 11/03/2020 by Natale Milch, MD No   Overview Addendum 03/09/2021 10:56 AM by Nadara Mustard, MD     Nursing Staff Provider  Office Location  [facility] Dating   LMP = 8wk [place]  Language  English Anatomy US  complete  Flu Vaccine  Declines Genetic Screen  NIPS: normal xx  TDaP vaccine   02/23/2021 Hgb A1C or  GTT Early : nml Third trimester : 107  Covid Unvaccinated Hx Covid Jan 2022   LAB RESULTS   Rhogam   not needed Blood Type O POS  Feeding Plan  Breast Antibody Negative (05/23 1126)  Contraception vasectomy Rubella 8.52 (05/23 1126)  Circumcision N\a RPR Non Reactive (05/23 1126)   Pediatrician   HBsAg Negative (05/23 1126)   Support Person husband HIV Non Reactive (05/23 1126)  Prenatal Classes discussed Varicella  Immune    GBS  (For PCN allergy, check sensitivities)   BTL Consent no    VBAC Consent n/a Pap  2020 NIL  Hyperthyroidism 04/10/2018 by Kelton Pillar, Melanie Crazier, MD No   Overview Signed 12/01/2020  4:31 PM by Will Bonnet, MD    [ ]  followed by endocrine in [place]. No meds per endocrine at this time []  no TSI test that I could find [ ]  MFM consult and anatomy scan            Preterm labor symptoms and general obstetric precautions including but not limited to vaginal bleeding, contractions, leaking of fluid and fetal movement were reviewed in detail with the patient. Please refer to After Visit Summary for other counseling recommendations.   - discussed delivery timing. Will await next growth scan.  Would need medical indication or MFM recommendation for 37 week IOL.  Will continue to monitor.  Return for Keep previously scheduled appts.   Prentice Docker, MD, [facility], [facility] 03/26/2021 9:23 AM

## 2021-03-31 ENCOUNTER — Other Ambulatory Visit: Payer: Self-pay

## 2021-03-31 DIAGNOSIS — O0993 Supervision of high risk pregnancy, unspecified, third trimester: Secondary | ICD-10-CM

## 2021-03-31 DIAGNOSIS — E0590 Thyrotoxicosis, unspecified without thyrotoxic crisis or storm: Secondary | ICD-10-CM

## 2021-03-31 DIAGNOSIS — F411 Generalized anxiety disorder: Secondary | ICD-10-CM

## 2021-03-31 DIAGNOSIS — Z8759 Personal history of other complications of pregnancy, childbirth and the puerperium: Secondary | ICD-10-CM

## 2021-04-02 ENCOUNTER — Other Ambulatory Visit: Payer: Self-pay

## 2021-04-02 ENCOUNTER — Ambulatory Visit (INDEPENDENT_AMBULATORY_CARE_PROVIDER_SITE_OTHER): Payer: Commercial Managed Care - PPO | Admitting: Obstetrics and Gynecology

## 2021-04-02 ENCOUNTER — Other Ambulatory Visit: Payer: Self-pay | Admitting: Maternal & Fetal Medicine

## 2021-04-02 ENCOUNTER — Ambulatory Visit: Payer: Commercial Managed Care - PPO | Attending: Maternal & Fetal Medicine

## 2021-04-02 VITALS — BP 120/70 | Ht 60.0 in | Wt 184.8 lb

## 2021-04-02 DIAGNOSIS — O99213 Obesity complicating pregnancy, third trimester: Secondary | ICD-10-CM | POA: Diagnosis not present

## 2021-04-02 DIAGNOSIS — O09293 Supervision of pregnancy with other poor reproductive or obstetric history, third trimester: Secondary | ICD-10-CM

## 2021-04-02 DIAGNOSIS — E0590 Thyrotoxicosis, unspecified without thyrotoxic crisis or storm: Secondary | ICD-10-CM | POA: Diagnosis not present

## 2021-04-02 DIAGNOSIS — Z8759 Personal history of other complications of pregnancy, childbirth and the puerperium: Secondary | ICD-10-CM

## 2021-04-02 DIAGNOSIS — Z3A35 35 weeks gestation of pregnancy: Secondary | ICD-10-CM | POA: Diagnosis not present

## 2021-04-02 DIAGNOSIS — O99283 Endocrine, nutritional and metabolic diseases complicating pregnancy, third trimester: Secondary | ICD-10-CM | POA: Diagnosis not present

## 2021-04-02 DIAGNOSIS — O0993 Supervision of high risk pregnancy, unspecified, third trimester: Secondary | ICD-10-CM

## 2021-04-02 DIAGNOSIS — F411 Generalized anxiety disorder: Secondary | ICD-10-CM | POA: Insufficient documentation

## 2021-04-02 DIAGNOSIS — O99343 Other mental disorders complicating pregnancy, third trimester: Secondary | ICD-10-CM | POA: Diagnosis not present

## 2021-04-02 LAB — FETAL NONSTRESS TEST

## 2021-04-02 NOTE — Progress Notes (Signed)
Routine Prenatal Care Visit  Subjective  Debra Hinton is a 25 y.o. G3P2002 at [redacted]w[redacted]d being seen today for ongoing prenatal care.  She is currently monitored for the following issues for this high-risk pregnancy and has Irritable bowel syndrome with constipation; Generalized anxiety disorder; Vitamin D deficiency; Hyperthyroidism; GERD (gastroesophageal reflux disease); Graves disease; Supervision of high risk pregnancy in second trimester; and History of prior pregnancy with IUGR on their problem list.  ----------------------------------------------------------------------------------- Patient reports no complaints.   Contractions: Irregular. Vag. Bleeding: None.  Movement: Present. Denies leaking of fluid.  ----------------------------------------------------------------------------------- The following portions of the patient's history were reviewed and updated as appropriate: allergies, current medications, past family history, past medical history, past social history, past surgical history and problem list. Problem list updated.   Objective  Blood pressure 120/70, height 5' (1.524 m), weight 184 lb 12.8 oz (83.8 kg), last menstrual period 07/29/2020, unknown if currently breastfeeding. Pregravid weight 166 lb (75.3 kg) Total Weight Gain 18 lb 12.8 oz (8.528 kg) Urinalysis:      Fetal Status:     Movement: Present     General:  Alert, oriented and cooperative. Patient is in no acute distress.  Skin: Skin is warm and dry. No rash noted.   Cardiovascular: Normal heart rate noted  Respiratory: Normal respiratory effort, no problems with respiration noted  Abdomen: Soft, gravid, appropriate for gestational age. Pain/Pressure: Absent     Pelvic:  Cervical exam deferred        Extremities: Normal range of motion.     Mental Status: Normal mood and affect. Normal behavior. Normal judgment and thought content.     Assessment   25 y.o. [id number] at [redacted]w[redacted]d by  05/05/2021, by Last  Menstrual Period presenting for routine prenatal visit  Plan   G3 Problems (from 10/13/20 to present)     Problem Noted Resolved   History of prior pregnancy with IUGR 02/09/2021 by Will Bonnet, MD No   Overview Signed 02/09/2021 10:59 AM by Will Bonnet, MD    - followed by MFM      Supervision of high risk pregnancy in second trimester 11/03/2020 by Homero Fellers, MD No   Overview Addendum 03/09/2021 10:56 AM by Gae Dry, MD     Nursing Staff Provider  Office Location  [facility] Dating   LMP = 8wk [place]  Language  English Anatomy US  complete  Flu Vaccine  Declines Genetic Screen  NIPS: normal xx  TDaP vaccine   02/23/2021 Hgb A1C or  GTT Early : nml Third trimester : 107  Covid Unvaccinated Hx Covid Jan 2022   LAB RESULTS   Rhogam   not needed Blood Type O POS  Feeding Plan  Breast Antibody Negative (05/23 1126)  Contraception vasectomy Rubella 8.52 (05/23 1126)  Circumcision N\a RPR Non Reactive (05/23 1126)   Pediatrician   HBsAg Negative (05/23 1126)   Support Person husband HIV Non Reactive (05/23 1126)  Prenatal Classes discussed Varicella  Immune    GBS  (For PCN allergy, check sensitivities)   BTL Consent no    VBAC Consent n/a Pap  2020 NIL         Hyperthyroidism 04/10/2018 by Kelton Pillar, Melanie Crazier, MD No   Overview Signed 12/01/2020  4:31 PM by Will Bonnet, MD    [ ]  followed by endocrine in [place]. No meds per endocrine at this time []  no TSI test that I could find [ ]  MFM consult and  anatomy scan           NST: 150 bpm baseline, moderate variability, 15x15 accelerations, no decelerations. Reactive  Reports MFM is considering 38 week delivery- fetal growth thus far normal.   Gestational age appropriate obstetric precautions including but not limited to vaginal bleeding, contractions, leaking of fluid and fetal movement were reviewed in detail with the patient.    Return in about 1 week (around 04/09/2021) for ROB/NST  with MD weekly for 2 weeks .  Natale Milch MD [facility], [facility] 04/02/2021, 2:37 PM

## 2021-04-02 NOTE — Patient Instructions (Signed)
Labor Induction ?Labor induction is when steps are taken to cause a pregnant woman to begin the labor process. Most women go into labor on their own between 37 weeks and 42 weeks of pregnancy. When this does not happen, or when there is a medical need for labor to begin, steps may be taken to induce, or bring on, labor. ?Labor induction causes a pregnant woman's uterus to contract. It also causes the cervix to soften (ripen), open (dilate), and thin out. Usually, labor is not induced before 39 weeks of pregnancy unless there is a medical reason to do so. ?When is labor induction considered? ?Labor induction may be right for you if: ?Your pregnancy lasts longer than 41 to 42 weeks. ?Your placenta is separating from your uterus (placental abruption). ?You have a rupture of membranes and your labor does not begin. ?You have health problems, like diabetes or high blood pressure (preeclampsia) during your pregnancy. ?Your baby has stopped growing or does not have enough amniotic fluid. ?Before labor induction begins, your health care provider will consider the following factors: ?Your medical condition and the baby's condition. ?How many weeks you have been pregnant. ?How mature the baby's lungs are. ?The condition of your cervix. ?The position of the baby. ?The size of your birth canal. ?Tell a health care provider about: ?Any allergies you have. ?All medicines you are taking, including vitamins, herbs, eye drops, creams, and over-the-counter medicines. ?Any problems you or your family members have had with anesthetic medicines. ?Any surgeries you have had. ?Any blood disorders you have. ?Any medical conditions you have. ?What are the risks? ?Generally, this is a safe procedure. However, problems may occur, including: ?Failed induction. ?Changes in fetal heart rate, such as being too high, too low, or irregular (erratic). ?Infection in the mother or the baby. ?Increased risk of having a cesarean delivery. ?Breaking off  (abruption) of the placenta from the uterus. This is rare. ?Rupture of the uterus. This is very rare. ?Your baby could fail to get enough blood flow or oxygen. This can be life-threatening. ?When induction is needed for medical reasons, the benefits generally outweigh the risks. ?What happens during the procedure? ?During the procedure, your health care provider will use one of these methods to induce labor: ?Stripping the membranes. In this method, the amniotic sac tissue is gently separated from the cervix. This causes the following to happen: ?Your cervix stretches, which in turn causes the release of prostaglandins. ?Prostaglandins induce labor and cause the uterus to contract. ?This procedure is often done in an office visit. You will be sent home to wait for contractions to begin. ?Prostaglandin medicine. This medicine starts contractions and causes the cervix to dilate and ripen. This can be taken by mouth (orally) or by being inserted into the vagina (suppository). ?Inserting a small, thin tube (catheter) with a balloon into the vagina and then expanding the balloon with water to dilate the cervix. ?Breaking the water. In this method, a small instrument is used to make a small hole in the amniotic sac. This eventually causes the amniotic sac to break. Contractions should begin within a few hours. ?Medicine to trigger or strengthen contractions. This medicine is given through an IV that is inserted into a vein in your arm. ?This procedure may vary among health care providers and hospitals. ?Where to find more information ?March of Dimes: www.marchofdimes.org ?The American College of Obstetricians and Gynecologists: www.acog.org ?Summary ?Labor induction causes a pregnant woman's uterus to contract. It also causes the cervix   to soften (ripen), open (dilate), and thin out. ?Labor is usually not induced before 39 weeks of pregnancy unless there is a medical reason to do so. ?When induction is needed for medical  reasons, the benefits generally outweigh the risks. ?Talk with your health care provider about which methods of labor induction are right for you. ?This information is not intended to replace advice given to you by your health care provider. Make sure you discuss any questions you have with your health care provider. ?Document Revised: 02/21/2020 Document Reviewed: 02/21/2020 ?Elsevier Patient Education ? 2022 Elsevier Inc. ? ?

## 2021-04-09 ENCOUNTER — Other Ambulatory Visit: Payer: Self-pay

## 2021-04-09 ENCOUNTER — Ambulatory Visit (INDEPENDENT_AMBULATORY_CARE_PROVIDER_SITE_OTHER): Payer: Commercial Managed Care - PPO | Admitting: Obstetrics & Gynecology

## 2021-04-09 ENCOUNTER — Ambulatory Visit: Payer: Commercial Managed Care - PPO | Attending: Maternal & Fetal Medicine

## 2021-04-09 ENCOUNTER — Encounter: Payer: Self-pay | Admitting: Obstetrics & Gynecology

## 2021-04-09 ENCOUNTER — Ambulatory Visit (HOSPITAL_BASED_OUTPATIENT_CLINIC_OR_DEPARTMENT_OTHER): Payer: Commercial Managed Care - PPO | Admitting: Maternal & Fetal Medicine

## 2021-04-09 VITALS — BP 120/80 | Wt 180.0 lb

## 2021-04-09 VITALS — BP 115/81 | HR 103 | Temp 98.0°F | Ht 60.0 in | Wt 180.5 lb

## 2021-04-09 DIAGNOSIS — E0590 Thyrotoxicosis, unspecified without thyrotoxic crisis or storm: Secondary | ICD-10-CM

## 2021-04-09 DIAGNOSIS — O99342 Other mental disorders complicating pregnancy, second trimester: Secondary | ICD-10-CM | POA: Diagnosis not present

## 2021-04-09 DIAGNOSIS — E669 Obesity, unspecified: Secondary | ICD-10-CM

## 2021-04-09 DIAGNOSIS — F32A Depression, unspecified: Secondary | ICD-10-CM

## 2021-04-09 DIAGNOSIS — Z3A36 36 weeks gestation of pregnancy: Secondary | ICD-10-CM

## 2021-04-09 DIAGNOSIS — E0500 Thyrotoxicosis with diffuse goiter without thyrotoxic crisis or storm: Secondary | ICD-10-CM

## 2021-04-09 DIAGNOSIS — O09293 Supervision of pregnancy with other poor reproductive or obstetric history, third trimester: Secondary | ICD-10-CM

## 2021-04-09 DIAGNOSIS — F419 Anxiety disorder, unspecified: Secondary | ICD-10-CM | POA: Diagnosis not present

## 2021-04-09 DIAGNOSIS — Z8759 Personal history of other complications of pregnancy, childbirth and the puerperium: Secondary | ICD-10-CM

## 2021-04-09 DIAGNOSIS — O99213 Obesity complicating pregnancy, third trimester: Secondary | ICD-10-CM | POA: Insufficient documentation

## 2021-04-09 DIAGNOSIS — Z3685 Encounter for antenatal screening for Streptococcus B: Secondary | ICD-10-CM

## 2021-04-09 DIAGNOSIS — O99283 Endocrine, nutritional and metabolic diseases complicating pregnancy, third trimester: Secondary | ICD-10-CM | POA: Diagnosis not present

## 2021-04-09 DIAGNOSIS — O0993 Supervision of high risk pregnancy, unspecified, third trimester: Secondary | ICD-10-CM

## 2021-04-09 LAB — FETAL NONSTRESS TEST

## 2021-04-09 NOTE — Addendum Note (Signed)
Addended by: Nadara Mustard on: 04/09/2021 02:25 PM   Modules accepted: Orders

## 2021-04-09 NOTE — Progress Notes (Signed)
MFM Brief Note  [name] is here for antenatal testing regarding hyperthyroidism. A biophysical profile of 8/8 was observed with good fetal movement and amniotic fluid volume.  She is drawning TSH labs today per her report and has a clinic visit this afternoon.  We discussed timing of delivery. She is trying to coordinate her husband being home as he is only off 1 week. The ideal time of delivery for her and her family is 38 weeks per Ms. Baldwins request.  She is concerned about repeat FGR and her hyperthyroidism.  In review of her history it is reasonable for delivery between 38-39 weeks assuming elevated TRAB's as noted in 2019, which have not been repeated. We discussed early term delivery can result in NICU admission. We further recommend shared decision making with her providers as well. She expressed an understanding of the risk and benefits of delivery prior to 39 weeks. She will discuss her labs results with her endocrinologist once resulted.   Finally I explained that if all is normal including blood pressure, fetal surviellance and TFT's a 39 week delivery is optimal with reduced risk to the fetus.  Recommendations. Continue weekly testing Redraw TSH. Shared decision making with timing of delivery between 38-39 weeks.   All questions answered.  Novella Olive, MD  I spent 20 minutes with > 50% in face to face consultation.

## 2021-04-09 NOTE — Progress Notes (Signed)
  Subjective  Fetal Movement? yes Contractions? no Leaking Fluid? no Vaginal Bleeding? no  Objective  BP 120/80   Wt 180 lb (81.6 kg)   LMP 07/29/2020 (Exact Date)   BMI 35.15 kg/m  General: NAD Pumonary: no increased work of breathing Abdomen: gravid, non-tender Extremities: no edema Psychiatric: mood appropriate, affect full  BPP 8/8 A NST procedure was performed with FHR monitoring and a normal baseline established, appropriate time of 20-40 minutes of evaluation, and accels >2 seen w 15x15 characteristics.  Results show a REACTIVE NST.   Assessment  25 y.o. [id number] at [redacted]w[redacted]d by  05/05/2021, by Last Menstrual Period presenting for routine prenatal visit  Plan   Problem List Items Addressed This Visit      Endocrine   Graves disease   Relevant Orders   Thyroid Panel With TSH     Other   History of prior pregnancy with IUGR  Other Visit Diagnoses    Supervision of high risk pregnancy in third trimester    -  Primary   Encounter for antenatal screening for Streptococcus B       [redacted] weeks gestation of pregnancy        DIscussed IOL- usually 39 weeks and she has had normal growth [place] lately (no signs of IUGR as with first pregnancy)    However MFM has verbally approved for her to have as early as 38 weeks    Will cont to discuss.  As it stands now, IOL is for 04/29/21 [facility] Thyroid labs today   G3 Problems (from 10/13/20 to present)    Problem     History of prior pregnancy with IUGR     Overview Signed 02/09/2021 10:59 AM by Conard Novak, MD    - followed by MFM      Supervision of high risk pregnancy in second trimester     Overview Addendum 03/09/2021 10:56 AM by Nadara Mustard, MD     Nursing Staff Provider  Office Location  [facility] Dating   LMP = 8wk [place]  Language  English Anatomy US  complete  Flu Vaccine  Declines Genetic Screen  NIPS: normal xx  TDaP vaccine   02/23/2021 Hgb A1C or  GTT Early : nml Third trimester : 107  Covid  Unvaccinated Hx Covid Jan 2022   LAB RESULTS   Rhogam   not needed Blood Type O POS  Feeding Plan  Breast Antibody Negative (05/23 1126)  Contraception vasectomy Rubella 8.52 (05/23 1126)  Circumcision N\a RPR Non Reactive (05/23 1126)   Pediatrician   HBsAg Negative (05/23 1126)   Support Person husband HIV Non Reactive (05/23 1126)  Prenatal Classes discussed Varicella  Immune    GBS  (For PCN allergy, check sensitivities)   BTL Consent no    VBAC Consent n/a Pap  2020 NIL                        Annamarie Major, MD, [facility], [facility] 04/09/2021  2:10 PM

## 2021-04-10 LAB — THYROID PANEL WITH TSH
Free Thyroxine Index: 3.5 (ref 1.2–4.9)
T3 Uptake Ratio: 24 % (ref 24–39)
T4, Total: 14.4 ug/dL — ABNORMAL HIGH (ref 4.5–12.0)
TSH: 0.398 u[IU]/mL — ABNORMAL LOW (ref 0.450–4.500)

## 2021-04-13 ENCOUNTER — Encounter: Payer: Self-pay | Admitting: Internal Medicine

## 2021-04-13 ENCOUNTER — Ambulatory Visit (INDEPENDENT_AMBULATORY_CARE_PROVIDER_SITE_OTHER): Payer: Commercial Managed Care - PPO | Admitting: Obstetrics and Gynecology

## 2021-04-13 ENCOUNTER — Telehealth (INDEPENDENT_AMBULATORY_CARE_PROVIDER_SITE_OTHER): Payer: Commercial Managed Care - PPO | Admitting: Internal Medicine

## 2021-04-13 ENCOUNTER — Encounter: Payer: Self-pay | Admitting: Obstetrics and Gynecology

## 2021-04-13 ENCOUNTER — Other Ambulatory Visit: Payer: Self-pay

## 2021-04-13 VITALS — Ht 60.0 in | Wt 182.0 lb

## 2021-04-13 VITALS — BP 120/70 | Ht 60.0 in | Wt 182.4 lb

## 2021-04-13 DIAGNOSIS — E0500 Thyrotoxicosis with diffuse goiter without thyrotoxic crisis or storm: Secondary | ICD-10-CM

## 2021-04-13 DIAGNOSIS — Z3A36 36 weeks gestation of pregnancy: Secondary | ICD-10-CM

## 2021-04-13 DIAGNOSIS — O0992 Supervision of high risk pregnancy, unspecified, second trimester: Secondary | ICD-10-CM

## 2021-04-13 DIAGNOSIS — E0590 Thyrotoxicosis, unspecified without thyrotoxic crisis or storm: Secondary | ICD-10-CM | POA: Diagnosis not present

## 2021-04-13 LAB — CULTURE, BETA STREP (GROUP B ONLY): Strep Gp B Culture: NEGATIVE

## 2021-04-13 NOTE — Patient Instructions (Signed)
Covid Testing at 04/27/2021 between 8 AM and 4 PM, in the [facility]. Follow signs for Pre-admission testing. Please wear a mask.  Induction 04/29/2021 at 8AM . Enter through the [facility] for an 8 AM induction. Please enter through the ER for a midnight or 5 AM induction.  Please eat a meal prior to your arrival.    Labor Induction Labor induction is when steps are taken to cause a pregnant woman to begin the labor process. Most women go into labor on their own between 37 weeks and 42 weeks of pregnancy. When this does not happen, or when there is a medical need for labor to begin, steps may be taken to induce, or bring on, labor. Labor induction causes a pregnant woman's uterus to contract. It also causes the cervix to soften (ripen), open (dilate), and thin out. Usually, labor is not induced before 39 weeks of pregnancy unless there is a medical reason to do so. When is labor induction considered? Labor induction may be right for you if: Your pregnancy lasts longer than 41 to 42 weeks. Your placenta is separating from your uterus (placental abruption). You have a rupture of membranes and your labor does not begin. You have health problems, like diabetes or high blood pressure (preeclampsia) during your pregnancy. Your baby has stopped growing or does not have enough amniotic fluid. Before labor induction begins, your health care provider will consider the following factors: Your medical condition and the baby's condition. How many weeks you have been pregnant. How mature the baby's lungs are. The condition of your cervix. The position of the baby. The size of your birth canal. Tell a health care provider about: Any allergies you have. All medicines you are taking, including vitamins, herbs, eye drops, creams, and over-the-counter medicines. Any problems you or your family members have had with anesthetic medicines. Any surgeries you have had. Any blood disorders you  have. Any medical conditions you have. What are the risks? Generally, this is a safe procedure. However, problems may occur, including: Failed induction. Changes in fetal heart rate, such as being too high, too low, or irregular (erratic). Infection in the mother or the baby. Increased risk of having a cesarean delivery. Breaking off (abruption) of the placenta from the uterus. This is rare. Rupture of the uterus. This is very rare. Your baby could fail to get enough blood flow or oxygen. This can be life-threatening. When induction is needed for medical reasons, the benefits generally outweigh the risks. What happens during the procedure? During the procedure, your health care provider will use one of these methods to induce labor: Stripping the membranes. In this method, the amniotic sac tissue is gently separated from the cervix. This causes the following to happen: Your cervix stretches, which in turn causes the release of prostaglandins. Prostaglandins induce labor and cause the uterus to contract. This procedure is often done in an office visit. You will be sent home to wait for contractions to begin. Prostaglandin medicine. This medicine starts contractions and causes the cervix to dilate and ripen. This can be taken by mouth (orally) or by being inserted into the vagina (suppository). Inserting a small, thin tube (catheter) with a balloon into the vagina and then expanding the balloon with water to dilate the cervix. Breaking the water. In this method, a small instrument is used to make a small hole in the amniotic sac. This eventually causes the amniotic sac to break. Contractions should begin within a few hours.  Medicine to trigger or strengthen contractions. This medicine is given through an IV that is inserted into a vein in your arm. This procedure may vary among health care providers and hospitals. Where to find more information March of Dimes: www.marchofdimes.org [employer]nec[employer]: www.acog.org Summary Labor induction causes a pregnant woman's uterus to contract. It also causes the cervix to soften (ripen), open (dilate), and thin out. Labor is usually not induced before 39 weeks of pregnancy unless there is a medical reason to do so. When induction is needed for medical reasons, the benefits generally outweigh the risks. Talk with your health care provider about which methods of labor induction are right for you. This information is not intended to replace advice given to you by your health care provider. Make sure you discuss any questions you have with your health care provider. Document Revised: 02/21/2020 Document Reviewed: 02/21/2020 Elsevier Patient Education  2022 [employer].

## 2021-04-13 NOTE — Progress Notes (Signed)
Routine Prenatal Care Visit  Subjective  Debra Hinton is a 25 y.o. G3P2002 at [redacted]w[redacted]d being seen today for ongoing prenatal care.  She is currently monitored for the following issues for this high-risk pregnancy and has Irritable bowel syndrome with constipation; Generalized anxiety disorder; Vitamin D deficiency; Hyperthyroidism; GERD (gastroesophageal reflux disease); Graves disease; Supervision of high risk pregnancy in second trimester; and History of prior pregnancy with IUGR on their problem list.  ----------------------------------------------------------------------------------- Patient reports no complaints.   Contractions: Irregular. Vag. Bleeding: None.  Movement: Present. Denies leaking of fluid.  ----------------------------------------------------------------------------------- The following portions of the patient's history were reviewed and updated as appropriate: allergies, current medications, past family history, past medical history, past social history, past surgical history and problem list. Problem list updated.   Objective  Blood pressure 120/70, height 5' (1.524 m), weight 182 lb 6.4 oz (82.7 kg), last menstrual period 07/29/2020, unknown if currently breastfeeding. Pregravid weight 166 lb (75.3 kg) Total Weight Gain 16 lb 6.4 oz (7.439 kg) Urinalysis:      Fetal Status:     Movement: Present     General:  Alert, oriented and cooperative. Patient is in no acute distress.  Skin: Skin is warm and dry. No rash noted.   Cardiovascular: Normal heart rate noted  Respiratory: Normal respiratory effort, no problems with respiration noted  Abdomen: Soft, gravid, appropriate for gestational age. Pain/Pressure: Absent     Pelvic:  Cervical exam deferred        Extremities: Normal range of motion.     Mental Status: Normal mood and affect. Normal behavior. Normal judgment and thought content.     Assessment   25 y.o. [id number] at [redacted]w[redacted]d by  05/05/2021, by Last  Menstrual Period presenting for routine prenatal visit  Plan   G3 Problems (from 10/13/20 to present)     Problem Noted Resolved   History of prior pregnancy with IUGR 02/09/2021 by Will Bonnet, MD No   Overview Signed 02/09/2021 10:59 AM by Will Bonnet, MD    - followed by MFM      Supervision of high risk pregnancy in second trimester 11/03/2020 by Homero Fellers, MD No   Overview Addendum 04/13/2021 11:50 AM by Homero Fellers, MD     Nursing Staff Provider  Office Location  [facility] Dating   LMP = 8wk [place]  Language  English Anatomy US  complete  Flu Vaccine  Declines Genetic Screen  NIPS: normal xx  TDaP vaccine   02/23/2021 Hgb A1C or  GTT Early : nml Third trimester : 107  Covid Unvaccinated Hx Covid Jan 2022   LAB RESULTS   Rhogam   not needed Blood Type O POS  Feeding Plan  Breast Antibody Negative (05/23 1126)  Contraception vasectomy Rubella 8.52 (05/23 1126)  Circumcision N\a RPR Non Reactive (05/23 1126)   Pediatrician   HBsAg Negative (05/23 1126)   Support Person husband HIV Non Reactive (05/23 1126)  Prenatal Classes discussed Varicella  Immune    GBS negative  BTL Consent no    VBAC Consent n/a Pap  2020 NIL         Hyperthyroidism 04/10/2018 by Kelton Pillar, Melanie Crazier, MD No   Overview Signed 12/01/2020  4:31 PM by Will Bonnet, MD    [ ]  followed by endocrine in [place]. No meds per endocrine at this time []  no TSI test that I could find [ ]  MFM consult and anatomy scan  NST: 140 bpm baseline, moderate variability, 15x15 accelerations, no decelerations. Reactive  Gestational age appropriate obstetric precautions including but not limited to vaginal bleeding, contractions, leaking of fluid and fetal movement were reviewed in detail with the patient.    Return in about 1 week (around 04/20/2021) for ROB/NST.  Natale Milch MD [facility], [facility] 04/13/2021, 11:51 AM

## 2021-04-13 NOTE — Progress Notes (Signed)
Virtual Visit via Video Note  I connected with Debra Hinton on 04/13/21 at  8:50 AM by a video enabled telemedicine application and verified that I am speaking with the correct person using two identifiers.   I discussed the limitations of evaluation and management by telemedicine and the availability of in person appointments. The patient expressed understanding and agreed to proceed.  -Location of the patient :home  -Location of the provider : Office -The names of all persons participating in the telemedicine service : Pt and myself      Name: Debra Hinton  MRN/ DOB: [id number], Apr 08, 1996    Age/ Sex: 25 y.o., female     PCP: Carlean Jews, NP   Reason for Endocrinology Evaluation: .Hyperthyroidism     Initial Endocrinology Clinic Visit:  04/10/2018    PATIENT IDENTIFIER: Debra Hinton is a 25 y.o., female with a past medical history of IBS, GAD, and Graves' disease. She has followed with [facility] since 04/10/2018 for consultative assistance with management of her Hyperthyroidism .   HISTORICAL SUMMARY:  [name], 2019 with c/o constipation alternating with diarrhea as well as anxiety and weight gain. Her Labs revealed a suppressed  TSH at < 0.006 uIU/mL and elevated FT4 and T3. A thyroid ultrasound was unrevealing.    On her initial visit to our office in November, 2020 her TSH continued to be low but with normalization of her FT4. We decided to withhold therapy due to improvement in her TFT's    By 2020 she became pregnant and was started on PTU but was lost to follow-up until her return to our office in 10/2020 when she was [redacted] weeks pregnant at the time with a suppressed TSH 0.006 uIU/mL    PTU caused metallic taste in the past    FH with thyroid disease. SUBJECTIVE:     Today (04/13/2021):  Debra Hinton is here for a follow-up on Graves' disease during  pregnancy.  She is currently at 36.6 weeks of gestation .  She is being induced next week     Weight has been stable  Has occasional diarrhea alternating with constipations that she attributes to IBS Denies nausea  Denies local neck swelling      HISTORY:  Past Medical History:  Past Medical History:  Diagnosis Date   Anxiety    Anxiety and depression    Depressive disorder    GERD (gastroesophageal reflux disease)    Hyperthyroidism 2019   Possible Graves disease   IBS (irritable bowel syndrome)    Irritable bowel syndrome (IBS)    Thyroid disease    Past Surgical History:  Past Surgical History:  Procedure Laterality Date   NO PAST SURGERIES     Social History:  reports that she quit smoking about 2 years ago. Her smoking use included cigarettes. She has never used smokeless tobacco. She reports that she does not currently use alcohol. She reports that she does not use drugs. Family History:  Family History  Problem Relation Age of Onset   Breast cancer Other         mggm   Hypertension Mother    Hyperlipidemia Father    Thyroid disease Maternal Grandmother    Dementia Maternal Grandmother    Heart attack Maternal Grandfather    Diabetes Paternal Grandmother    Alcoholism Paternal Grandmother    Cancer Paternal Grandfather  pancreatic   Healthy Brother    Hypothyroidism Maternal Aunt    Hypothyroidism Maternal Aunt    Breast cancer Maternal Aunt        30s     HOME MEDICATIONS: Allergies as of 04/13/2021   No Known Allergies      Medication List        Accurate as of April 13, 2021  8:25 AM. If you have any questions, ask your nurse or doctor.          aspirin 81 MG chewable tablet Chew 81 mg by mouth daily.   cetirizine 10 MG tablet Commonly known as: ZYRTEC Take 10 mg by mouth daily.   PRENATAL VITAMINS PO Take by mouth.   ranitidine 150 MG capsule Commonly known as: ZANTAC Take 150 mg by mouth 2 (two) times daily.           OBJECTIVE:   PHYSICAL EXAM: VS: LMP 07/29/2020 (Exact Date)    EXAM: General: Pt appears well and is in NAD No local neck swelling noted      DATA REVIEWED:   Latest Reference Range & Units 04/09/21 14:21  TSH 0.450 - 4.500 uIU/mL 0.398 (L)  Thyroxine (T4) 4.5 - 12.0 ug/dL 38.4 (H)  Free Thyroxine Index 1.2 - 4.9  3.5  T3 Uptake Ratio 24 - 39 % 24    Results for AIREAL, SLATER (MRN [id number]) as of 12/22/2020 15:20  Ref. Range 04/10/2018 09:43  Thyrotropin Receptor Ab Latest Ref Range: <=16.0 % 30.4 (H)    ASSESSMENT / PLAN / RECOMMENDATIONS:   Hyperthyroidism During 3rd Trimester   - She is clinically euthyroid  - No local neck symptoms  - She has not required any thionamide treatment during pregnancy - The goal of treatment is to maintain persistent but mild hyperthyroidism in the mother in an attempt to prevent fetal hypothyroidism since the fetal thyroid is more sensitive to the action of thionamide therapy. Overtreatment of maternal hyperthyroidism can cause fetal goiter and primary hypothyroidism.  - PTU caused metallic taste int he past  - No treatment needed at this time, she will have TSH, FT4 and FT3 at 6 weeks post delivery.  - We revised S/S of hyperthyroidism and to bring it to our attention sooner then later for another recheck.      I discussed the assessment and treatment plan with the patient. The patient was provided an opportunity to ask questions and all were answered. The patient agreed with the plan and demonstrated an understanding of the instructions.    Signed electronically by: Lyndle Herrlich, MD  [facility]  [facility] [address] [place], [place] [address] Phone: [phone] FAX: [phone]      CC: Carlean Jews, NP [address] [place] [address] Phone: [phone]  Fax: [phone]   Return to Endocrinology clinic as below: Future  Appointments  Date Time Provider Department Center  04/13/2021 11:30 AM Natale Milch, MD [facility] None  04/13/2021  4:00 PM Bayard More, Konrad Dolores, MD [facility] None  04/14/2021 11:30 AM [facility]  04/24/2021  9:30 AM Dominic, Courtney Heys, CNM [facility] None

## 2021-04-14 ENCOUNTER — Other Ambulatory Visit: Payer: Self-pay

## 2021-04-14 ENCOUNTER — Ambulatory Visit: Payer: Commercial Managed Care - PPO | Attending: Maternal & Fetal Medicine

## 2021-04-14 DIAGNOSIS — O99283 Endocrine, nutritional and metabolic diseases complicating pregnancy, third trimester: Secondary | ICD-10-CM | POA: Diagnosis not present

## 2021-04-14 DIAGNOSIS — Z8759 Personal history of other complications of pregnancy, childbirth and the puerperium: Secondary | ICD-10-CM | POA: Diagnosis not present

## 2021-04-14 DIAGNOSIS — O99343 Other mental disorders complicating pregnancy, third trimester: Secondary | ICD-10-CM

## 2021-04-14 DIAGNOSIS — F419 Anxiety disorder, unspecified: Secondary | ICD-10-CM | POA: Diagnosis not present

## 2021-04-14 DIAGNOSIS — O99213 Obesity complicating pregnancy, third trimester: Secondary | ICD-10-CM | POA: Diagnosis present

## 2021-04-14 DIAGNOSIS — E0590 Thyrotoxicosis, unspecified without thyrotoxic crisis or storm: Secondary | ICD-10-CM | POA: Insufficient documentation

## 2021-04-14 DIAGNOSIS — O09293 Supervision of pregnancy with other poor reproductive or obstetric history, third trimester: Secondary | ICD-10-CM | POA: Diagnosis not present

## 2021-04-14 DIAGNOSIS — Z3A37 37 weeks gestation of pregnancy: Secondary | ICD-10-CM | POA: Insufficient documentation

## 2021-04-14 DIAGNOSIS — F32A Depression, unspecified: Secondary | ICD-10-CM | POA: Insufficient documentation

## 2021-04-14 DIAGNOSIS — E0500 Thyrotoxicosis with diffuse goiter without thyrotoxic crisis or storm: Secondary | ICD-10-CM

## 2021-04-24 ENCOUNTER — Ambulatory Visit: Payer: Commercial Managed Care - PPO | Attending: Maternal & Fetal Medicine

## 2021-04-24 ENCOUNTER — Inpatient Hospital Stay
Admission: EM | Admit: 2021-04-24 | Discharge: 2021-04-26 | DRG: 806 | Disposition: A | Discharge status: Home / Self Care | Payer: Commercial Managed Care - PPO | Source: Ambulatory Visit | Attending: Obstetrics and Gynecology | Admitting: Obstetrics and Gynecology

## 2021-04-24 ENCOUNTER — Inpatient Hospital Stay: Payer: Commercial Managed Care - PPO | Admitting: Anesthesiology

## 2021-04-24 ENCOUNTER — Encounter: Payer: Commercial Managed Care - PPO | Admitting: Obstetrics and Gynecology

## 2021-04-24 ENCOUNTER — Other Ambulatory Visit: Payer: Self-pay | Admitting: Maternal & Fetal Medicine

## 2021-04-24 ENCOUNTER — Encounter: Payer: Self-pay | Admitting: *Deleted

## 2021-04-24 ENCOUNTER — Other Ambulatory Visit: Payer: Self-pay

## 2021-04-24 ENCOUNTER — Ambulatory Visit: Payer: Commercial Managed Care - PPO | Admitting: *Deleted

## 2021-04-24 ENCOUNTER — Ambulatory Visit (HOSPITAL_BASED_OUTPATIENT_CLINIC_OR_DEPARTMENT_OTHER): Payer: Commercial Managed Care - PPO | Admitting: Maternal & Fetal Medicine

## 2021-04-24 ENCOUNTER — Ambulatory Visit (HOSPITAL_BASED_OUTPATIENT_CLINIC_OR_DEPARTMENT_OTHER): Payer: Commercial Managed Care - PPO | Admitting: *Deleted

## 2021-04-24 ENCOUNTER — Encounter: Payer: Self-pay | Admitting: Obstetrics and Gynecology

## 2021-04-24 VITALS — BP 143/85 | HR 87

## 2021-04-24 DIAGNOSIS — O99213 Obesity complicating pregnancy, third trimester: Secondary | ICD-10-CM | POA: Diagnosis present

## 2021-04-24 DIAGNOSIS — O288 Other abnormal findings on antenatal screening of mother: Secondary | ICD-10-CM

## 2021-04-24 DIAGNOSIS — O134 Gestational [pregnancy-induced] hypertension without significant proteinuria, complicating childbirth: Secondary | ICD-10-CM | POA: Diagnosis present

## 2021-04-24 DIAGNOSIS — O99283 Endocrine, nutritional and metabolic diseases complicating pregnancy, third trimester: Secondary | ICD-10-CM | POA: Insufficient documentation

## 2021-04-24 DIAGNOSIS — Z8759 Personal history of other complications of pregnancy, childbirth and the puerperium: Secondary | ICD-10-CM

## 2021-04-24 DIAGNOSIS — Z20822 Contact with and (suspected) exposure to covid-19: Secondary | ICD-10-CM | POA: Diagnosis present

## 2021-04-24 DIAGNOSIS — O0992 Supervision of high risk pregnancy, unspecified, second trimester: Secondary | ICD-10-CM | POA: Diagnosis present

## 2021-04-24 DIAGNOSIS — O365930 Maternal care for other known or suspected poor fetal growth, third trimester, not applicable or unspecified: Secondary | ICD-10-CM | POA: Diagnosis present

## 2021-04-24 DIAGNOSIS — E0590 Thyrotoxicosis, unspecified without thyrotoxic crisis or storm: Secondary | ICD-10-CM

## 2021-04-24 DIAGNOSIS — F32A Depression, unspecified: Secondary | ICD-10-CM | POA: Diagnosis present

## 2021-04-24 DIAGNOSIS — E0500 Thyrotoxicosis with diffuse goiter without thyrotoxic crisis or storm: Secondary | ICD-10-CM | POA: Diagnosis present

## 2021-04-24 DIAGNOSIS — Z3A38 38 weeks gestation of pregnancy: Secondary | ICD-10-CM | POA: Diagnosis not present

## 2021-04-24 DIAGNOSIS — O283 Abnormal ultrasonic finding on antenatal screening of mother: Secondary | ICD-10-CM | POA: Diagnosis present

## 2021-04-24 DIAGNOSIS — O4103X0 Oligohydramnios, third trimester, not applicable or unspecified: Secondary | ICD-10-CM

## 2021-04-24 DIAGNOSIS — F419 Anxiety disorder, unspecified: Secondary | ICD-10-CM | POA: Diagnosis present

## 2021-04-24 DIAGNOSIS — Z87891 Personal history of nicotine dependence: Secondary | ICD-10-CM

## 2021-04-24 DIAGNOSIS — O09293 Supervision of pregnancy with other poor reproductive or obstetric history, third trimester: Secondary | ICD-10-CM | POA: Diagnosis not present

## 2021-04-24 DIAGNOSIS — O133 Gestational [pregnancy-induced] hypertension without significant proteinuria, third trimester: Secondary | ICD-10-CM

## 2021-04-24 DIAGNOSIS — Z3490 Encounter for supervision of normal pregnancy, unspecified, unspecified trimester: Secondary | ICD-10-CM | POA: Diagnosis present

## 2021-04-24 DIAGNOSIS — E039 Hypothyroidism, unspecified: Secondary | ICD-10-CM | POA: Diagnosis not present

## 2021-04-24 DIAGNOSIS — Z7982 Long term (current) use of aspirin: Secondary | ICD-10-CM | POA: Diagnosis not present

## 2021-04-24 DIAGNOSIS — O99284 Endocrine, nutritional and metabolic diseases complicating childbirth: Secondary | ICD-10-CM | POA: Diagnosis not present

## 2021-04-24 DIAGNOSIS — O99344 Other mental disorders complicating childbirth: Secondary | ICD-10-CM | POA: Diagnosis not present

## 2021-04-24 LAB — COMPREHENSIVE METABOLIC PANEL
ALT: 12 U/L (ref 0–44)
AST: 14 U/L — ABNORMAL LOW (ref 15–41)
Albumin: 3.1 g/dL — ABNORMAL LOW (ref 3.5–5.0)
Alkaline Phosphatase: 150 U/L — ABNORMAL HIGH (ref 38–126)
Anion gap: 5 (ref 5–15)
BUN: 8 mg/dL (ref 6–20)
CO2: 24 mmol/L (ref 22–32)
Calcium: 8.3 mg/dL — ABNORMAL LOW (ref 8.9–10.3)
Chloride: 107 mmol/L (ref 98–111)
Creatinine, Ser: 0.66 mg/dL (ref 0.44–1.00)
GFR, Estimated: >60 mL/min (ref >60)
Glucose, Bld: 94 mg/dL (ref 70–99)
Potassium: 4 mmol/L (ref 3.5–5.1)
Sodium: 136 mmol/L (ref 135–145)
Total Bilirubin: 0.7 mg/dL (ref 0.3–1.2)
Total Protein: 6.2 g/dL — ABNORMAL LOW (ref 6.5–8.1)

## 2021-04-24 LAB — URINE DRUG SCREEN, QUALITATIVE (ARMC ONLY)
Amphetamines, Ur Screen: NOT DETECTED
Barbiturates, Ur Screen: NOT DETECTED
Benzodiazepine, Ur Scrn: NOT DETECTED
Cannabinoid 50 Ng, Ur ~~LOC~~: NOT DETECTED
Cocaine Metabolite,Ur ~~LOC~~: NOT DETECTED
MDMA (Ecstasy)Ur Screen: NOT DETECTED
Methadone Scn, Ur: NOT DETECTED
Opiate, Ur Screen: NOT DETECTED
Phencyclidine (PCP) Ur S: NOT DETECTED
Tricyclic, Ur Screen: NOT DETECTED

## 2021-04-24 LAB — CBC
HCT: 37.3 % (ref 36.0–46.0)
Hemoglobin: 12.5 g/dL (ref 12.0–15.0)
MCH: 29.3 pg (ref 26.0–34.0)
MCHC: 33.5 g/dL (ref 30.0–36.0)
MCV: 87.6 fL (ref 80.0–100.0)
Platelets: 216 10*3/uL (ref 150–400)
RBC: 4.26 MIL/uL (ref 3.87–5.11)
RDW: 12.7 % (ref 11.5–15.5)
WBC: 7.9 10*3/uL (ref 4.0–10.5)
nRBC: 0 % (ref 0.0–0.2)

## 2021-04-24 LAB — TYPE AND SCREEN
ABO/RH(D): O POS
Antibody Screen: NEGATIVE

## 2021-04-24 LAB — PROTEIN / CREATININE RATIO, URINE
Creatinine, Urine: 38 mg/dL
Total Protein, Urine: <6 mg/dL

## 2021-04-24 LAB — RESP PANEL BY RT-PCR (FLU A&B, COVID) ARPGX2
Influenza A by PCR: NEGATIVE
Influenza B by PCR: NEGATIVE
SARS Coronavirus 2 by RT PCR: NEGATIVE

## 2021-04-24 LAB — RUPTURE OF MEMBRANE (ROM)PLUS: Rom Plus: NEGATIVE

## 2021-04-24 MED ORDER — LIDOCAINE-EPINEPHRINE (PF) 1.5 %-1:200000 IJ SOLN
INTRAMUSCULAR | Status: DC | PRN
  Administered 2021-04-24: 3 mL via EPIDURAL

## 2021-04-24 MED ORDER — OXYTOCIN-SODIUM CHLORIDE 30-0.9 UT/500ML-% IV SOLN: 1.0000 m[IU]/min | INTRAVENOUS | Status: DC

## 2021-04-24 MED ORDER — OXYTOCIN-SODIUM CHLORIDE 30-0.9 UT/500ML-% IV SOLN
INTRAVENOUS | Status: AC
  Filled 2021-04-24: qty 500

## 2021-04-24 MED ORDER — LACTATED RINGERS IV SOLN: 500.0000 mL | INTRAVENOUS | Status: DC | PRN

## 2021-04-24 MED ORDER — FENTANYL-BUPIVACAINE-NACL 0.5-0.125-0.9 MG/250ML-% EP SOLN
EPIDURAL | Status: DC | PRN
  Administered 2021-04-24: 12 mL/h via EPIDURAL

## 2021-04-24 MED ORDER — OXYTOCIN 10 UNIT/ML IJ SOLN
INTRAMUSCULAR | Status: AC
  Filled 2021-04-24: qty 2

## 2021-04-24 MED ORDER — OXYTOCIN BOLUS FROM INFUSION
333.0000 mL | Freq: Once | INTRAVENOUS | Status: AC
  Administered 2021-04-25: 333 mL via INTRAVENOUS

## 2021-04-24 MED ORDER — LIDOCAINE HCL (PF) 1 % IJ SOLN
INTRAMUSCULAR | Status: AC
  Filled 2021-04-24: qty 30

## 2021-04-24 MED ORDER — DIPHENHYDRAMINE HCL 50 MG/ML IJ SOLN: 12.5000 mg | INTRAMUSCULAR | Status: DC | PRN

## 2021-04-24 MED ORDER — BUTORPHANOL TARTRATE 1 MG/ML IJ SOLN
2.0000 mg | INTRAMUSCULAR | Status: DC | PRN
  Administered 2021-04-24 (×2): 2 mg via INTRAVENOUS
  Filled 2021-04-24 (×2): qty 2

## 2021-04-24 MED ORDER — EPHEDRINE 5 MG/ML INJ: 10.0000 mg | INTRAVENOUS | Status: DC | PRN

## 2021-04-24 MED ORDER — SODIUM CHLORIDE 0.9 % IV SOLN
INTRAVENOUS | Status: DC | PRN
  Administered 2021-04-24 (×2): 7.5 mL via EPIDURAL

## 2021-04-24 MED ORDER — OXYTOCIN-SODIUM CHLORIDE 30-0.9 UT/500ML-% IV SOLN: 2.5000 [IU]/h | INTRAVENOUS | Status: DC

## 2021-04-24 MED ORDER — PHENYLEPHRINE 40 MCG/ML (10ML) SYRINGE FOR IV PUSH (FOR BLOOD PRESSURE SUPPORT): 80.0000 ug | PREFILLED_SYRINGE | INTRAVENOUS | Status: DC | PRN

## 2021-04-24 MED ORDER — LIDOCAINE HCL (PF) 1 % IJ SOLN: 30.0000 mL | INTRAMUSCULAR | Status: DC | PRN

## 2021-04-24 MED ORDER — AMMONIA AROMATIC IN INHA
RESPIRATORY_TRACT | Status: AC
  Filled 2021-04-24: qty 10

## 2021-04-24 MED ORDER — ACETAMINOPHEN 325 MG PO TABS: 650.0000 mg | ORAL_TABLET | ORAL | Status: DC | PRN

## 2021-04-24 MED ORDER — ONDANSETRON HCL 4 MG/2ML IJ SOLN: 4.0000 mg | Freq: Four times a day (QID) | INTRAMUSCULAR | Status: DC | PRN

## 2021-04-24 MED ORDER — TERBUTALINE SULFATE 1 MG/ML IJ SOLN: 0.2500 mg | Freq: Once | INTRAMUSCULAR | Status: DC | PRN

## 2021-04-24 MED ORDER — FENTANYL-BUPIVACAINE-NACL 0.5-0.125-0.9 MG/250ML-% EP SOLN: 12.0000 mL/h | EPIDURAL | Status: DC | PRN

## 2021-04-24 MED ORDER — FENTANYL-BUPIVACAINE-NACL 0.5-0.125-0.9 MG/250ML-% EP SOLN
EPIDURAL | Status: AC
  Filled 2021-04-24: qty 250

## 2021-04-24 MED ORDER — SOD CITRATE-CITRIC ACID 500-334 MG/5ML PO SOLN: 30.0000 mL | ORAL | Status: DC | PRN

## 2021-04-24 MED ORDER — LACTATED RINGERS IV SOLN
500.0000 mL | Freq: Once | INTRAVENOUS | Status: AC
  Administered 2021-04-24: 500 mL via INTRAVENOUS

## 2021-04-24 MED ORDER — LACTATED RINGERS IV SOLN: INTRAVENOUS | Status: DC

## 2021-04-24 MED ORDER — MISOPROSTOL 200 MCG PO TABS
ORAL_TABLET | ORAL | Status: AC
  Filled 2021-04-24: qty 4

## 2021-04-24 MED ORDER — MISOPROSTOL 25 MCG QUARTER TABLET
25.0000 ug | ORAL_TABLET | ORAL | Status: DC | PRN
  Administered 2021-04-24: 25 ug via VAGINAL
  Filled 2021-04-24: qty 1

## 2021-04-24 MED ORDER — LIDOCAINE HCL (PF) 1 % IJ SOLN
INTRAMUSCULAR | Status: DC | PRN
  Administered 2021-04-24: 1 mL via SUBCUTANEOUS

## 2021-04-24 NOTE — Anesthesia Procedure Notes (Signed)
Epidural Patient location during procedure: OB Start time: 04/24/2021 9:31 PM End time: 04/24/2021 9:53 PM  Staffing Anesthesiologist: Foye Deer, MD Performed: anesthesiologist   Preanesthetic Checklist Completed: patient identified, IV checked, site marked, risks and benefits discussed, surgical consent, monitors and equipment checked, pre-op evaluation and timeout performed  Epidural Patient position: sitting Prep: ChloraPrep Patient monitoring: heart rate, continuous pulse ox and blood pressure Approach: midline Location: L2-L3 Injection technique: LOR saline  Needle:  Needle type: Tuohy  Needle gauge: 18 G Needle length: 9 cm Needle insertion depth: 6 cm Catheter type: closed end Catheter size: 20 Guage Catheter at skin depth: 10 cm Test dose: negative and 1.5% lidocaine with Epi 1:200 K  Assessment Events: blood not aspirated  Additional Notes 2 attempts. First attempt at L3-4, no paresthesia notedReason for block:procedure for pain

## 2021-04-24 NOTE — Procedures (Signed)
Debra Hinton [birth year] [redacted]w[redacted]d  Fetus A Non-Stress Test Interpretation for 04/24/21  Indication: Unsatisfactory BPP, decreased AFI  Fetal Heart Rate A Mode: External Baseline Rate (A): 150 bpm Variability: Moderate Accelerations: 15 x 15 Decelerations: None Multiple birth?: No  Uterine Activity Mode: Palpation, Toco Contraction Frequency (min): 2 ucs Contraction Duration (sec): 40-50 Contraction Quality: Mild Resting Tone Palpated: Relaxed Resting Time: Adequate  Interpretation (Fetal Testing) Nonstress Test Interpretation: Reactive Overall Impression: Reassuring for gestational age Comments: Dr. Grace Bushy reviewed tracing

## 2021-04-24 NOTE — Progress Notes (Signed)
MFM Brief Note  Debra Hinton is here for antenatal testing performed given maternal hyperthyroidism and FGR in the pregnancy.  She seen at the request of Dr. Thomasene Mohair  The biophysical profile was 8/10 with good fetal movement and subjectively decreased amniotic fluid.  Given decreased amniotic fluid volume and BPP 8/10 recommend consideration for delivery.  I discussed with today's findings with Dr. Bonney Aid and he agreed to have Debra Hinton move toward delivery.  She is going to go home and then to the hospital.   I spent 20 minutes with > 50% in face to face consultation.  Novella Olive, MD.

## 2021-04-24 NOTE — Anesthesia Preprocedure Evaluation (Signed)
Anesthesia Evaluation  Patient identified by MRN, date of birth, ID band Patient awake    Reviewed: Allergy & Precautions, H&P , NPO status , Patient's Chart, lab work & pertinent test results  History of Anesthesia Complications Negative for: history of anesthetic complications  Airway Mallampati: III  TM Distance: <3 FB Neck ROM: full    Dental no notable dental hx.    Pulmonary neg pulmonary ROS, former smoker,    Pulmonary exam normal        Cardiovascular Exercise Tolerance: Good (-) hypertensionnegative cardio ROS Normal cardiovascular exam     Neuro/Psych PSYCHIATRIC DISORDERS Anxiety    GI/Hepatic GERD  Medicated and Controlled,  Endo/Other  Hyperthyroidism   Renal/GU   negative genitourinary   Musculoskeletal   Abdominal gravid  Peds  Hematology negative hematology ROS (+)   Anesthesia Other Findings Past Medical History: No date: Anxiety No date: Anxiety and depression No date: Depressive disorder No date: GERD (gastroesophageal reflux disease) 2019: Hyperthyroidism     Comment:  Possible Graves disease No date: IBS (irritable bowel syndrome) No date: Irritable bowel syndrome (IBS) No date: Thyroid disease  Past Surgical History: No date: NO PAST SURGERIES  BMI    Body Mass Index: 35.74 kg/m      Reproductive/Obstetrics (+) Pregnancy                             Anesthesia Physical  Anesthesia Plan  ASA: 2  Anesthesia Plan: Epidural   Post-op Pain Management:    Induction:   PONV Risk Score and Plan:   Airway Management Planned:   Additional Equipment:   Intra-op Plan:   Post-operative Plan:   Informed Consent: I have reviewed the patients History and Physical, chart, labs and discussed the procedure including the risks, benefits and alternatives for the proposed anesthesia with the patient or authorized representative who has indicated his/her  understanding and acceptance.       Plan Discussed with: Anesthesiologist  Anesthesia Plan Comments:         Anesthesia Quick Evaluation

## 2021-04-24 NOTE — H&P (Addendum)
OB History & Physical   History of Present Illness:  Chief Complaint: sent from MFM for IOL for FGR/low AFI  HPI:  Debra Hinton is a 25 y.o. G39P2002 female at [redacted]w[redacted]d dated by LMP.  Her pregnancy has been complicated by anxiety, hyperthyroid, fetal growth restriction, history of IUGR .    She denies contractions.   She reports some leakage of fluid in the last couple days.   She denies vaginal bleeding.   She reports fetal movement.    Total weight gain for pregnancy: 6.804 kg   Obstetrical Problem List: G3 Problems (from 10/13/20 to present)     Problem Noted Resolved   History of prior pregnancy with IUGR 02/09/2021 by Will Bonnet, MD No   Overview Signed 02/09/2021 10:59 AM by Will Bonnet, MD    - followed by MFM      Supervision of high risk pregnancy in second trimester 11/03/2020 by Homero Fellers, MD No   Overview Addendum 04/13/2021 11:50 AM by Homero Fellers, MD     Nursing Staff Provider  Office Location  [facility] Dating   LMP = 8wk [place]  Language  English Anatomy US  complete  Flu Vaccine  Declines Genetic Screen  NIPS: normal xx  TDaP vaccine   02/23/2021 Hgb A1C or  GTT Early : nml Third trimester : 107  Covid Unvaccinated Hx Covid Jan 2022   LAB RESULTS   Rhogam   not needed Blood Type O POS  Feeding Plan  Breast Antibody Negative (05/23 1126)  Contraception vasectomy Rubella 8.52 (05/23 1126)  Circumcision N\a RPR Non Reactive (05/23 1126)   Pediatrician   HBsAg Negative (05/23 1126)   Support Person husband HIV Non Reactive (05/23 1126)  Prenatal Classes discussed Varicella  Immune    GBS negative  BTL Consent no    VBAC Consent n/a Pap  2020 NIL         Hyperthyroidism 04/10/2018 by Kelton Pillar, Melanie Crazier, MD No   Overview Signed 12/01/2020  4:31 PM by Will Bonnet, MD    [ ]  followed by endocrine in [place]. No meds per endocrine at this time []  no TSI test that I could find [ ]  MFM consult and anatomy scan            Maternal Medical History:   Past Medical History:  Diagnosis Date   Anxiety    Anxiety and depression    Depressive disorder    GERD (gastroesophageal reflux disease)    Hyperthyroidism 2019   Possible Graves disease   IBS (irritable bowel syndrome)    Irritable bowel syndrome (IBS)    Thyroid disease     Past Surgical History:  Procedure Laterality Date   NO PAST SURGERIES      No Known Allergies  Prior to Admission medications   Medication Sig Start Date End Date Taking? Authorizing Provider  aspirin 81 MG chewable tablet Chew 81 mg by mouth daily.    [provider]  cetirizine (ZYRTEC) 10 MG tablet Take 10 mg by mouth daily.    [provider]  Prenatal Vit-Fe Fumarate-FA (PRENATAL VITAMINS PO) Take by mouth.    [provider]  ranitidine (ZANTAC) 150 MG capsule Take 150 mg by mouth 2 (two) times daily.    [provider]    OB History  Gravida Para Term Preterm AB Living  3 2 2  0 0 2  SAB IAB Ectopic Multiple Live Births  0 0  0 0 2    # Outcome Date GA Lbr Len/2nd Weight Sex Delivery Anes PTL Lv  3 Current           2 Term 02/25/19 [redacted]w[redacted]d 06:02 / 00:13 2380 g F Vag-Spont EPI  LIV  1 Term 02/11/15 [redacted]w[redacted]d / 01:04 3402 g M Vag-Spont EPI  LIV    Prenatal care site: [facility]  Social History: She  reports that she quit smoking about 2 years ago. Her smoking use included cigarettes. She has never used smokeless tobacco. She reports that she does not currently use alcohol. She reports that she does not use drugs.  Family History: family history includes Alcoholism in her paternal grandmother; Breast cancer in her maternal aunt and another family member; Cancer in her paternal grandfather; Dementia in her maternal grandmother; Diabetes in her paternal grandmother; Healthy in her brother; Heart attack in her maternal grandfather; Hyperlipidemia in her father; Hypertension in her mother; Hypothyroidism in her maternal aunt  and maternal aunt; Thyroid disease in her maternal grandmother.    Review of Systems:  Review of Systems  Constitutional:  Negative for chills and fever.  HENT:  Negative for congestion, ear discharge, ear pain, hearing loss, sinus pain and sore throat.   Eyes:  Negative for blurred vision and double vision.  Respiratory:  Negative for cough, shortness of breath and wheezing.   Cardiovascular:  Negative for chest pain, palpitations and leg swelling.  Gastrointestinal:  Negative for abdominal pain, blood in stool, constipation, diarrhea, heartburn, melena, nausea and vomiting.  Genitourinary:  Negative for dysuria, flank pain, frequency, hematuria and urgency.  Musculoskeletal:  Negative for back pain, joint pain and myalgias.  Skin:  Negative for itching and rash.  Neurological:  Negative for dizziness, tingling, tremors, sensory change, speech change, focal weakness, seizures, loss of consciousness, weakness and headaches.  Endo/Heme/Allergies:  Negative for environmental allergies. Does not bruise/bleed easily.  Psychiatric/Behavioral:  Negative for depression, hallucinations, memory loss, substance abuse and suicidal ideas. The patient is not nervous/anxious and does not have insomnia.     Physical Exam:  LMP 07/29/2020 (Exact Date)   Vital Signs: LMP 07/29/2020 (Exact Date)  Constitutional: Well nourished, well developed female in no acute distress.  HEENT: normal Skin: Warm and dry.  Cardiovascular: Regular rate and rhythm.   Extremity:  trace edema   Respiratory: Clear to auscultation bilateral. Normal respiratory effort Abdomen: FHT present Back: no CVAT Neuro: DTRs 2+, Cranial nerves grossly intact Psych: Alert and Oriented x3. No memory deficits. Normal mood and affect.  MS: normal gait, normal bilateral lower extremity ROM/strength/stability.  Cervical exam pending at time of note   Latest Reference Range & Units 04/24/21 13:12  RESP PANEL BY RT-PCR (FLU A&B, COVID)  ARPGX2  Rpt  Influenza A By PCR NEGATIVE  NEGATIVE  Influenza B By PCR NEGATIVE  NEGATIVE  SARS Coronavirus 2 by RT PCR NEGATIVE  NEGATIVE  Rom Plus  NEGATIVE  Rpt: View report in Results Review for more information  Reactive NST at MFM visit  BPP with Dr Gertie Exon this morning: 6/8, tone not observed, AFI subjectively decreased  Lab Results  Component Value Date   [place] NEGATIVE 02/23/2019    Assessment:  Debra Hinton is a 26 y.o. G66P2002 female at [redacted]w[redacted]d with fetal growth restriction, decreased amniotic fluid. IOL per MFM   Plan:  Admit to Labor & Delivery  CBC, T&S, Clrs, IVF GBS negative.   Fetal well-being: reassuring Begin induction based on admission exam  Tresea Mall, CNM 04/24/2021 12:32 PM

## 2021-04-24 NOTE — Progress Notes (Signed)
Subjective:  Feeling painful contractions, tolerable after stadol administration  Objective:   Vitals: Blood pressure 119/70, pulse (!) 54, temperature (!) 97.3 F (36.3 C), temperature source Oral, resp. rate 14, height 5' (1.524 m), weight 82.6 kg, last menstrual period 07/29/2020, unknown if currently breastfeeding. General: NAD Abdomen: gravid, non-tender Cervical Exam:  Dilation: 3 Effacement (%): 70 Cervical Position: Posterior Station: -3 Presentation: Vertex Exam by:: Affie Gasner MD  FHT: 135, moderate, +accels, no decels Toco: q2-80min  Results for orders placed or performed during the hospital encounter of 04/24/21 (from the past 24 hour(s))  Resp Panel by RT-PCR (Flu A&B, Covid) Nasopharyngeal Swab     Status: None   Collection Time: 04/24/21  1:12 PM   Specimen: Nasopharyngeal Swab; Nasopharyngeal(NP) swabs in vial transport medium  Result Value Ref Range   SARS Coronavirus 2 by RT PCR NEGATIVE NEGATIVE   Influenza A by PCR NEGATIVE NEGATIVE   Influenza B by PCR NEGATIVE NEGATIVE  ROM Plus ([facility] only)     Status: None   Collection Time: 04/24/21  1:12 PM  Result Value Ref Range   Rom Plus NEGATIVE   CBC     Status: None   Collection Time: 04/24/21  1:39 PM  Result Value Ref Range   WBC 7.9 4.0 - 10.5 K/uL   RBC 4.26 3.87 - 5.11 MIL/uL   Hemoglobin 12.5 12.0 - 15.0 g/dL   HCT 02.7 74.1 - 28.7 %   MCV 87.6 80.0 - 100.0 fL   MCH 29.3 26.0 - 34.0 pg   MCHC 33.5 30.0 - 36.0 g/dL   RDW 86.7 67.2 - 09.4 %   Platelets 216 150 - 400 K/uL   nRBC 0.0 0.0 - 0.2 %  Type and screen     Status: None (Preliminary result)   Collection Time: 04/24/21  1:39 PM  Result Value Ref Range   ABO/RH(D) PENDING    Antibody Screen PENDING    Sample Expiration      04/27/2021,2359 Performed at [facility], [address] [place], [place] [address]   Type and screen     Status: None   Collection Time: 04/24/21  4:01 PM  Result Value Ref Range   ABO/RH(D) O POS     Antibody Screen NEG    Sample Expiration      04/27/2021,2359 Performed at [facility], [address] [place], [place] [address]   Urine Drug Screen, Qualitative ([facility] only)     Status: None   Collection Time: 04/24/21  4:16 PM  Result Value Ref Range   Tricyclic, Ur Screen NONE DETECTED NONE DETECTED   Amphetamines, Ur Screen NONE DETECTED NONE DETECTED   MDMA (Ecstasy)Ur Screen NONE DETECTED NONE DETECTED   Cocaine Metabolite,Ur [place] NONE DETECTED NONE DETECTED   Opiate, Ur Screen NONE DETECTED NONE DETECTED   Phencyclidine (PCP) Ur S NONE DETECTED NONE DETECTED   Cannabinoid 50 Ng, Ur [place] NONE DETECTED NONE DETECTED   Barbiturates, Ur Screen NONE DETECTED NONE DETECTED   Benzodiazepine, Ur Scrn NONE DETECTED NONE DETECTED   Methadone Scn, Ur NONE DETECTED NONE DETECTED  Protein / creatinine ratio, urine     Status: None   Collection Time: 04/24/21  4:16 PM  Result Value Ref Range   Creatinine, Urine 38 mg/dL   Total Protein, Urine <6 mg/dL   Protein Creatinine Ratio        0.00 - 0.15 mg/mg[Cre]  Comprehensive metabolic panel     Status: Abnormal   Collection  Time: 04/24/21  6:33 PM  Result Value Ref Range   Sodium 136 135 - 145 mmol/L   Potassium 4.0 3.5 - 5.1 mmol/L   Chloride 107 98 - 111 mmol/L   CO2 24 22 - 32 mmol/L   Glucose, Bld 94 70 - 99 mg/dL   BUN 8 6 - 20 mg/dL   Creatinine, Ser 0.34 0.44 - 1.00 mg/dL   Calcium 8.3 (L) 8.9 - 10.3 mg/dL   Total Protein 6.2 (L) 6.5 - 8.1 g/dL   Albumin 3.1 (L) 3.5 - 5.0 g/dL   AST 14 (L) 15 - 41 U/L   ALT 12 0 - 44 U/L   Alkaline Phosphatase 150 (H) 38 - 126 U/L   Total Bilirubin 0.7 0.3 - 1.2 mg/dL   GFR, Estimated >91 >79 mL/min   Anion gap 5 5 - 15    Assessment:   25 y.o. G3P2002 [redacted]w[redacted]d IOL 6/8BPP today (AFI 5.1cm)  Plan:   1) Labor -still contracting regularly off of 1st dose of cytotec.  Patient declines intracervical foley catheter or AROM  2) Fetus - category I tracing  3) Elevated BP- normal labs,  asymptomatic, consistent with GHTN  4) History of IUGR - grwoth scan 04/02/2021 5lbs 6oz c.w 26%ile at 35 weeks  Vena Austria, MD, [facility], [facility] 04/24/2021, 8:36 PM

## 2021-04-25 ENCOUNTER — Encounter: Payer: Self-pay | Admitting: Obstetrics and Gynecology

## 2021-04-25 DIAGNOSIS — O133 Gestational [pregnancy-induced] hypertension without significant proteinuria, third trimester: Secondary | ICD-10-CM

## 2021-04-25 DIAGNOSIS — E039 Hypothyroidism, unspecified: Secondary | ICD-10-CM

## 2021-04-25 DIAGNOSIS — F419 Anxiety disorder, unspecified: Secondary | ICD-10-CM

## 2021-04-25 DIAGNOSIS — O99284 Endocrine, nutritional and metabolic diseases complicating childbirth: Secondary | ICD-10-CM

## 2021-04-25 DIAGNOSIS — O365930 Maternal care for other known or suspected poor fetal growth, third trimester, not applicable or unspecified: Principal | ICD-10-CM

## 2021-04-25 DIAGNOSIS — O99344 Other mental disorders complicating childbirth: Secondary | ICD-10-CM

## 2021-04-25 DIAGNOSIS — O134 Gestational [pregnancy-induced] hypertension without significant proteinuria, complicating childbirth: Secondary | ICD-10-CM

## 2021-04-25 DIAGNOSIS — O09293 Supervision of pregnancy with other poor reproductive or obstetric history, third trimester: Secondary | ICD-10-CM

## 2021-04-25 DIAGNOSIS — Z3A38 38 weeks gestation of pregnancy: Secondary | ICD-10-CM

## 2021-04-25 DIAGNOSIS — O4103X0 Oligohydramnios, third trimester, not applicable or unspecified: Secondary | ICD-10-CM

## 2021-04-25 LAB — CBC
HCT: 34.5 % — ABNORMAL LOW (ref 36.0–46.0)
Hemoglobin: 11.4 g/dL — ABNORMAL LOW (ref 12.0–15.0)
MCH: 29.1 pg (ref 26.0–34.0)
MCHC: 33 g/dL (ref 30.0–36.0)
MCV: 88 fL (ref 80.0–100.0)
Platelets: 181 10*3/uL (ref 150–400)
RBC: 3.92 MIL/uL (ref 3.87–5.11)
RDW: 12.9 % (ref 11.5–15.5)
WBC: 8.3 10*3/uL (ref 4.0–10.5)
nRBC: 0 % (ref 0.0–0.2)

## 2021-04-25 LAB — RPR: RPR Ser Ql: NONREACTIVE

## 2021-04-25 MED ORDER — PRENATAL MULTIVITAMIN CH
1.0000 | ORAL_TABLET | Freq: Every day | ORAL | Status: DC
  Administered 2021-04-25: 1 via ORAL
  Filled 2021-04-25: qty 1

## 2021-04-25 MED ORDER — DIPHENHYDRAMINE HCL 25 MG PO CAPS: 25.0000 mg | ORAL_CAPSULE | Freq: Four times a day (QID) | ORAL | Status: DC | PRN

## 2021-04-25 MED ORDER — COCONUT OIL OIL
1.0000 "application " | TOPICAL_OIL | Status: DC | PRN
  Filled 2021-04-25: qty 120

## 2021-04-25 MED ORDER — SIMETHICONE 80 MG PO CHEW: 80.0000 mg | CHEWABLE_TABLET | ORAL | Status: DC | PRN

## 2021-04-25 MED ORDER — WITCH HAZEL-GLYCERIN EX PADS: 1.0000 "application " | MEDICATED_PAD | CUTANEOUS | Status: DC | PRN

## 2021-04-25 MED ORDER — ACETAMINOPHEN 325 MG PO TABS
650.0000 mg | ORAL_TABLET | ORAL | Status: DC | PRN
  Administered 2021-04-25 (×3): 650 mg via ORAL
  Filled 2021-04-25 (×3): qty 2

## 2021-04-25 MED ORDER — IBUPROFEN 600 MG PO TABS
600.0000 mg | ORAL_TABLET | Freq: Four times a day (QID) | ORAL | Status: DC
  Administered 2021-04-25 – 2021-04-26 (×6): 600 mg via ORAL
  Filled 2021-04-25 (×6): qty 1

## 2021-04-25 MED ORDER — SENNOSIDES-DOCUSATE SODIUM 8.6-50 MG PO TABS
2.0000 | ORAL_TABLET | ORAL | Status: DC
  Administered 2021-04-25 – 2021-04-26 (×2): 2 via ORAL
  Filled 2021-04-25 (×2): qty 2

## 2021-04-25 MED ORDER — BENZOCAINE-MENTHOL 20-0.5 % EX AERO
1.0000 "application " | INHALATION_SPRAY | CUTANEOUS | Status: DC | PRN
  Filled 2021-04-25 (×2): qty 56

## 2021-04-25 MED ORDER — ONDANSETRON HCL 4 MG/2ML IJ SOLN: 4.0000 mg | INTRAMUSCULAR | Status: DC | PRN

## 2021-04-25 MED ORDER — ONDANSETRON HCL 4 MG PO TABS: 4.0000 mg | ORAL_TABLET | ORAL | Status: DC | PRN

## 2021-04-25 MED ORDER — OXYCODONE-ACETAMINOPHEN 5-325 MG PO TABS: 1.0000 | ORAL_TABLET | ORAL | Status: DC | PRN

## 2021-04-25 MED ORDER — OXYCODONE-ACETAMINOPHEN 5-325 MG PO TABS: 2.0000 | ORAL_TABLET | ORAL | Status: DC | PRN

## 2021-04-25 MED ORDER — DIBUCAINE (PERIANAL) 1 % EX OINT: 1.0000 "application " | TOPICAL_OINTMENT | CUTANEOUS | Status: DC | PRN

## 2021-04-25 NOTE — Progress Notes (Signed)
Post Partum Day 0 Subjective: up ad lib, voiding, tolerating PO, and she is having fairly strong after pains. Using Tylenol and Ibuprofen for pain.   Objective: Blood pressure 116/78, pulse 78, temperature 98 F (36.7 C), temperature source Oral, resp. rate 20, height 5' (1.524 m), weight 82.6 kg, last menstrual period 07/29/2020, SpO2 99 %, unknown if currently breastfeeding.  Physical Exam:  General: alert, fatigued, and no distress Lochia: appropriate Uterine Fundus: firm Perineum intact. DVT Evaluation: No evidence of DVT seen on physical exam. Negative Homan's sign.  Recent Labs    04/24/21 1339 04/25/21 0533  HGB 12.5 11.4*  HCT 37.3 34.5*    Assessment/Plan: Plan for discharge tomorrow Continue postpartum orders She is aware that there is Roxicodone available for pain, if needed.   LOS: 1 day   Debra Hinton 04/25/2021, 1:50 PM

## 2021-04-25 NOTE — TOC Initial Note (Signed)
Transition of Care [facility]) - Initial/Assessment Note    Patient Details  Name: Debra Hinton MRN: [id number] Date of Birth: 29-Nov-1995  [facility] CM/SW Contact:    Hetty Ely, RN Phone Number: 04/25/2021, 11:30 AM  Clinical Narrative:  Spoke with MOB, FOB in the room and did leave for discussion. Discussed positive UDS for Marijuana in 09/2020, patient states she did smoke prior to knowing she was pregnant and immediately stopped. Have not had any since and will not smoke due to breast feeding and she does not need professional assistance. MOB did inquire about next steps and if baby cord was positive. I did explain to patient that cord results is not available and take time to result however if positive will need to report to CPS, patient voices understanding. MOB says she does not need resources that she will not smoke anymore. MOB voices having two other kids in the home ages six and two. No TOC barriers identified at this time will continue to track NB Cord results and follow up per guidelines.                       Patient Goals and CMS Choice        Expected Discharge Plan and Services                                                Prior Living Arrangements/Services                       Activities of Daily Living Home Assistive Devices/Equipment: Contact lenses ADL Screening (condition at time of admission) Patient's cognitive ability adequate to safely complete daily activities?: Yes Is the patient deaf or have difficulty hearing?: No Does the patient have difficulty seeing, even when wearing glasses/contacts?: No Does the patient have difficulty concentrating, remembering, or making decisions?: No Patient able to express need for assistance with ADLs?: Yes Does the patient have difficulty dressing or bathing?: No Independently performs ADLs?: Yes (appropriate for developmental age) Does the patient have difficulty walking or  climbing stairs?: No Weakness of Legs: None Weakness of Arms/Hands: None  Permission Sought/Granted                  Emotional Assessment              Admission diagnosis:  Encounter for elective induction of labor [Z34.90] Labor and delivery indication for care or intervention [O75.9] Patient Active Problem List   Diagnosis Date Noted   Labor and delivery indication for care or intervention 04/25/2021   Gestational hypertension, third trimester    Oligohydramnios in third trimester    Non-reassuring fetal status, delivered, current hospitalization    Encounter for elective induction of labor 04/24/2021   History of prior pregnancy with IUGR 02/09/2021   Supervision of high risk pregnancy in second trimester 11/03/2020   Graves disease 07/26/2018   GERD (gastroesophageal reflux disease) 05/31/2018   Hyperthyroidism 04/10/2018   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

## 2021-04-25 NOTE — Anesthesia Postprocedure Evaluation (Signed)
Anesthesia Post Note  Patient: Debra Hinton  Procedure(s) Performed: AN AD HOC LABOR EPIDURAL  Patient location during evaluation: Mother Baby Anesthesia Type: Epidural Level of consciousness: awake and alert Pain management: pain level controlled Vital Signs Assessment: post-procedure vital signs reviewed and stable Respiratory status: spontaneous breathing, nonlabored ventilation and respiratory function stable Cardiovascular status: stable Postop Assessment: no headache, no backache and epidural receding Anesthetic complications: no   No notable events documented.   Last Vitals:  Vitals:   04/25/21 1157 04/25/21 1555  BP: 116/78 120/66  Pulse: 78 99  Resp: 20 18  Temp: 36.7 C 36.8 C  SpO2:      Last Pain:  Vitals:   04/25/21 1555  TempSrc: Oral  PainSc:                  Yevette Edwards

## 2021-04-25 NOTE — Discharge Summary (Signed)
OB Discharge Summary     Patient Name: Debra Hinton DOB: 1996/05/08 MRN: [id number]  Date of admission: 04/24/2021 Delivering MD: Vena Austria   Date of discharge: 04/26/2021  Admitting diagnosis: Encounter for elective induction of labor [Z34.90] Labor and delivery indication for care or intervention [O75.9] Intrauterine pregnancy: [redacted]w[redacted]d     Secondary diagnosis:  Principal Problem:   Encounter for elective induction of labor Active Problems:   Labor and delivery indication for care or intervention   Gestational hypertension, third trimester   Oligohydramnios in third trimester   Non-reassuring fetal status, delivered, current hospitalization  Additional problems: Gestational hypertension, non-reassuring antepartum fetal testing     Discharge diagnosis: Term Pregnancy Delivered                                                                                                Post partum procedures: none  Augmentation: Cytotec  Complications: None  Hospital course:  Induction of Labor With Vaginal Delivery   25 y.o. yo [id number] at [redacted]w[redacted]d was admitted to the hospital 04/24/2021 for induction of labor.  Indication for induction:  BPP 8/10, borderline AFI, and gestational hypertension .  Patient had an uncomplicated labor course as follows: Membrane Rupture Time/Date: 8:36 PM ,04/24/2021   Delivery Method:Vaginal, Spontaneous  Episiotomy: None  Lacerations:  None  Details of delivery can be found in separate delivery note.  Patient had a routine postpartum course. Patient is discharged home 04/26/21.  Newborn Data: Birth date:04/25/2021  Birth time:12:55 AM  Gender:Female  Living status:Living  Apgars:9 ,9  Weight:   Physical exam  Vitals:   04/25/21 1555 04/25/21 1727 04/25/21 2350 04/26/21 0815  BP: 120/66 133/83 121/83 119/76  Pulse: 99 66 70 84  Resp: 18 18 18 19   Temp: 98.3 F (36.8 C) 98 F (36.7 C) 98.2 F (36.8 C) 98 F (36.7 C)  TempSrc: Oral Oral  Oral Oral  SpO2:   100% 99%  Weight:      Height:       General: alert, cooperative, and no distress Lochia: appropriate Uterine Fundus: firm DVT Evaluation: No evidence of DVT seen on physical exam. Negative Homan's sign. Labs: Lab Results  Component Value Date   WBC 8.3 04/25/2021   HGB 11.4 (L) 04/25/2021   HCT 34.5 (L) 04/25/2021   MCV 88.0 04/25/2021   PLT 181 04/25/2021   CMP Latest Ref Rng & Units 04/24/2021  Glucose 70 - 99 mg/dL 94  BUN 6 - 20 mg/dL 8  Creatinine 14/06/2020 - 7.06 mg/dL 2.37  Sodium 6.28 - 315 mmol/L 136  Potassium 3.5 - 5.1 mmol/L 4.0  Chloride 98 - 111 mmol/L 107  CO2 22 - 32 mmol/L 24  Calcium 8.9 - 10.3 mg/dL 8.3(L)  Total Protein 6.5 - 8.1 g/dL 6.2(L)  Total Bilirubin 0.3 - 1.2 mg/dL 0.7  Alkaline Phos 38 - 126 U/L 150(H)  AST 15 - 41 U/L 14(L)  ALT 0 - 44 U/L 12    Discharge instruction: per After Visit Summary and "Baby and Me Booklet".  After visit meds:  Allergies as of 04/26/2021  No Known Allergies      Medication List     STOP taking these medications    aspirin 81 MG chewable tablet   cetirizine 10 MG tablet Commonly known as: ZYRTEC   ranitidine 150 MG capsule Commonly known as: ZANTAC       TAKE these medications    PRENATAL VITAMINS PO Take by mouth.        Diet: routine diet  Activity: Advance as tolerated. Pelvic rest for 6 weeks.   Outpatient follow up:1 week Follow up Appt:No future appointments. Follow up Visit:No follow-ups on file.  Postpartum contraception: Vasectomy  Newborn Data: Live born female  Birth Weight:   APGAR: 9, 9  Newborn Delivery   Birth date/time: 04/25/2021 00:55:00 Delivery type: Vaginal, Spontaneous      Baby Feeding: Breast Disposition:home with mother   04/26/2021 Mirna Mires, CNM

## 2021-04-26 NOTE — Progress Notes (Signed)
Patient discharged with infant. Discharge instructions, prescriptions, and follow up appointments given to and reviewed with patient. Patient verbalized understanding. Will be escorted out by axillary.  °

## 2021-04-27 ENCOUNTER — Encounter: Payer: Self-pay | Admitting: Obstetrics and Gynecology

## 2021-04-27 ENCOUNTER — Ambulatory Visit (INDEPENDENT_AMBULATORY_CARE_PROVIDER_SITE_OTHER): Payer: Commercial Managed Care - PPO | Admitting: Obstetrics and Gynecology

## 2021-04-27 ENCOUNTER — Other Ambulatory Visit: Payer: Self-pay

## 2021-04-27 VITALS — BP 128/76 | Ht 60.0 in | Wt 175.0 lb

## 2021-04-27 DIAGNOSIS — Z0130 Encounter for examination of blood pressure without abnormal findings: Secondary | ICD-10-CM

## 2021-04-27 NOTE — Addendum Note (Signed)
Addended by: Novella Olive on: 04/27/2021 05:20 PM   Modules accepted: Level of Service

## 2021-04-28 ENCOUNTER — Ambulatory Visit: Payer: Commercial Managed Care - PPO

## 2021-05-02 ENCOUNTER — Ambulatory Visit
Admission: EM | Admit: 2021-05-02 | Discharge: 2021-05-02 | Disposition: A | Discharge status: Home / Self Care | Payer: Commercial Managed Care - PPO

## 2021-05-02 ENCOUNTER — Other Ambulatory Visit: Payer: Self-pay

## 2021-05-02 ENCOUNTER — Encounter: Payer: Self-pay | Admitting: Emergency Medicine

## 2021-05-02 DIAGNOSIS — R030 Elevated blood-pressure reading, without diagnosis of hypertension: Secondary | ICD-10-CM

## 2021-05-02 NOTE — ED Provider Notes (Signed)
[facility]    CSN: [id number] Arrival date & time: 05/02/21  1324      History   Chief Complaint Chief Complaint  Patient presents with   Hypertension    HPI Debra Hinton is a 25 y.o. female.   HPI Patient presents today for evaluation of elevated blood pressure.  Patient has a history of Graves' disease and a history of gestational hypertension.  She has been monitoring her blood pressure at home and this morning she awakened with a headache and checked her blood pressure and her readings were in the 150/90's.  She took ibuprofen for a headache and reports that eased her headache off however she still has what is described as like a tension type headache. She reports intermittently seeing spots but has not had any dizziness, leg swelling, chest pain, visual changes, or any pressure behind her eyes.  She was seen by her PCP 5 days ago and had a normal blood pressure reading.  She is here for evaluation.   Past Medical History:  Diagnosis Date   Anxiety    Anxiety and depression    Depressive disorder    GERD (gastroesophageal reflux disease)    Hyperthyroidism 2019   Possible Graves disease   IBS (irritable bowel syndrome)    Irritable bowel syndrome (IBS)    Thyroid disease     Patient Active Problem List   Diagnosis Date Noted   Labor and delivery indication for care or intervention 04/25/2021   Gestational hypertension, third trimester    Oligohydramnios in third trimester    Non-reassuring fetal status, delivered, current hospitalization    Encounter for elective induction of labor 04/24/2021   History of prior pregnancy with IUGR 02/09/2021   Supervision of high risk pregnancy in second trimester 11/03/2020   Graves disease 07/26/2018   GERD (gastroesophageal reflux disease) 05/31/2018   Hyperthyroidism 04/10/2018   Irritable bowel syndrome with constipation 02/22/2018   Generalized anxiety disorder 02/22/2018   Vitamin D deficiency  02/22/2018    Past Surgical History:  Procedure Laterality Date   NO PAST SURGERIES      OB History     Gravida  3   Para  3   Term  3   Preterm  0   AB  0   Living  3      SAB  0   IAB  0   Ectopic  0   Multiple  0   Live Births  3            Home Medications    Prior to Admission medications   Medication Sig Start Date End Date Taking? Authorizing Provider  Prenatal Vit-Fe Fumarate-FA (PRENATAL VITAMINS PO) Take by mouth.   Yes [provider]  ibuprofen (ADVIL) 200 MG tablet Take 200 mg by mouth every 6 (six) hours as needed.    [provider]    Family History Family History  Problem Relation Age of Onset   Breast cancer Other         mggm   Hypertension Mother    Hyperlipidemia Father    Thyroid disease Maternal Grandmother    Dementia Maternal Grandmother    Heart attack Maternal Grandfather    Diabetes Paternal Grandmother    Alcoholism Paternal Grandmother    Cancer Paternal Grandfather        pancreatic   Healthy Brother    Hypothyroidism Maternal Aunt    Hypothyroidism Maternal Aunt    Breast  cancer Maternal Aunt        30s    Social History Social History   Tobacco Use   Smoking status: Former    Types: Cigarettes    Quit date: 07/03/2018    Years since quitting: 2.8   Smokeless tobacco: Never  Vaping Use   Vaping Use: Former  Substance Use Topics   Alcohol use: Not Currently    Comment: 2 drinks/month   Drug use: Never    Types: Marijuana     Allergies   Patient has no known allergies.   Review of Systems Review of Systems Pertinent negatives listed in HPI  Physical Exam Triage Vital Signs ED Triage Vitals  Enc Vitals Group     BP 05/02/21 1413 111/79     Pulse Rate 05/02/21 1413 98     Resp 05/02/21 1413 18     Temp 05/02/21 1413 98.2 F (36.8 C)     Temp Source 05/02/21 1413 Oral     SpO2 05/02/21 1413 98 %     Weight --      Height --      Head Circumference --      Peak Flow  --      Pain Score 05/02/21 1424 2     Pain Loc --      Pain Edu? --      Excl. in [place]? --    No data found.  Updated Vital Signs BP 111/79 (BP Location: Left Arm)   Pulse 98   Temp 98.2 F (36.8 C) (Oral)   Resp 18   SpO2 98%   Breastfeeding Yes   Visual Acuity Right Eye Distance:   Left Eye Distance:   Bilateral Distance:    Right Eye Near:   Left Eye Near:    Bilateral Near:     Physical Exam Constitutional:      Appearance: Normal appearance.  HENT:     Head: Normocephalic.     Nose: Nose normal.  Eyes:     Extraocular Movements: Extraocular movements intact.     Pupils: Pupils are equal, round, and reactive to light.  Cardiovascular:     Rate and Rhythm: Normal rate and regular rhythm.  Pulmonary:     Effort: Pulmonary effort is normal.     Breath sounds: Normal breath sounds.  Musculoskeletal:     Cervical back: Normal range of motion. No rigidity or tenderness.     Right lower leg: No edema.     Left lower leg: No edema.  Skin:    General: Skin is warm and dry.     Capillary Refill: Capillary refill takes less than 2 seconds.  Neurological:     Mental Status: She is alert.  Psychiatric:        Mood and Affect: Mood normal.     UC Treatments / Results  Labs (all labs ordered are listed, but only abnormal results are displayed) Labs Reviewed - No data to display  EKG   Radiology No results found.  Procedures Procedures (including critical care time)  Medications Ordered in UC Medications - No data to display  Initial Impression / Assessment and Plan / UC Course  I have reviewed the triage vital signs and the nursing notes.  Pertinent labs & imaging results that were available during my care of the patient were reviewed by me and considered in my medical decision making (see chart for details).     Elevated blood pressure reading without a diagnosis  of hypertension.  Advised patient to follow-up with her PCP and she is also followed by  endocrinology and has a visit scheduled for 6 weeks.  Advised her to contact their office to see if she could obtain a sooner appointment.  In the meantime I will provide her with a blood pressure log and advised her to check her blood pressure twice daily once in the morning and once at bedtime around the same time. We discussed red flag symptoms which warrant immediate evaluation in the setting of the ER. Blood pressure checked here twice once manually all readings are less than <120/90 (see vitals). Recommended discontinuing ibuprofen as its not resolving headache and tried naproxen.  Advised to avoid ASA as this is contradicted in breast-feeding.  Patient is accompanied  by mother both verbalized understanding and agreement with plan. Final Clinical Impressions(s) / UC Diagnoses   Final diagnoses:  Elevated blood pressure reading without diagnosis of hypertension     Discharge Instructions      Contact your primary care provider for follow-up on Graves' disease and elevated blood pressure readings today.  Naproxen is okay to take with breastfeeding.  Aspirin is not recommended  with breastfeeding.  Keep a long of blood pressure readings, check around the same time every morning and every night.    ED Prescriptions   None    PDMP not reviewed this encounter.   Scot Jun, FNP 05/02/21 [phone]

## 2021-05-02 NOTE — Discharge Instructions (Addendum)
Contact your primary care provider for follow-up on Graves' disease and elevated blood pressure readings today.  Naproxen is okay to take with breastfeeding.  Aspirin is not recommended  with breastfeeding.  Keep a long of blood pressure readings, check around the same time every morning and every night.

## 2021-05-02 NOTE — ED Triage Notes (Signed)
Pt took BP today and it was 130s/90's. She called EM and her BP was 155/92. She has a HA and seeing floating spots.

## 2021-05-04 ENCOUNTER — Ambulatory Visit: Payer: Commercial Managed Care - PPO | Admitting: Obstetrics & Gynecology

## 2021-05-04 ENCOUNTER — Telehealth: Payer: Self-pay

## 2021-05-04 ENCOUNTER — Encounter: Payer: Self-pay | Admitting: Obstetrics & Gynecology

## 2021-05-04 ENCOUNTER — Other Ambulatory Visit: Payer: Self-pay

## 2021-05-04 ENCOUNTER — Ambulatory Visit (INDEPENDENT_AMBULATORY_CARE_PROVIDER_SITE_OTHER): Payer: Commercial Managed Care - PPO | Admitting: Obstetrics & Gynecology

## 2021-05-04 VITALS — BP 120/70 | Ht 60.0 in | Wt 174.0 lb

## 2021-05-04 DIAGNOSIS — O165 Unspecified maternal hypertension, complicating the puerperium: Secondary | ICD-10-CM | POA: Diagnosis not present

## 2021-05-04 MED ORDER — NIFEDIPINE ER OSMOTIC RELEASE 30 MG PO TB24: 30.0000 mg | ORAL_TABLET | Freq: Every day | ORAL | 2 refills | Status: DC

## 2021-05-04 NOTE — Telephone Encounter (Signed)
Pt calling; is a little over 1wk pp; AMS told her to let us know if problems with blood pressure; blood pressure has been spiking all weekend and has had a headache; called EMT Sat and went to UC who wouldn't tx; told to call us thi am.  [phone]

## 2021-05-04 NOTE — Progress Notes (Signed)
Obstetrics & Gynecology Office Visit   Chief Complaint  Patient presents with   Blood Pressure Check    History of Present Illness: 25 y.o. G3P3003 being seen for follow up blood pressure check today.  The patient is  postpartum, and she had gest HTN though never on meds. The established diagnosis for the patient is gestational hypertension.  She is currently on no antihypertensives.  She reports headaches.  Medication list reviewed medications which may contribute to BP elevation were not noted and no medications contraindicated for use in patient with current hypertension were noted.  Denies edema, CP, SOB.  Past Medical History:  Past Medical History:  Diagnosis Date   Anxiety    Anxiety and depression    Depressive disorder    GERD (gastroesophageal reflux disease)    Hyperthyroidism 2019   Possible Graves disease   IBS (irritable bowel syndrome)    Irritable bowel syndrome (IBS)    Thyroid disease     Past Surgical History:  Past Surgical History:  Procedure Laterality Date   NO PAST SURGERIES      Gynecologic History: No LMP recorded.  Obstetric History: [id number]  Family History:  Family History  Problem Relation Age of Onset   Breast cancer Other         mggm   Hypertension Mother    Hyperlipidemia Father    Thyroid disease Maternal Grandmother    Dementia Maternal Grandmother    Heart attack Maternal Grandfather    Diabetes Paternal Grandmother    Alcoholism Paternal Grandmother    Cancer Paternal Grandfather        pancreatic   Healthy Brother    Hypothyroidism Maternal Aunt    Hypothyroidism Maternal Aunt    Breast cancer Maternal Aunt        30s    Social History:  Social History   Socioeconomic History   Marital status: Married    Spouse name: Larkin Ina   Number of children: 1   Years of education: Not on file   Highest education level: Not on file  Occupational History   Not on file  Tobacco Use   Smoking status: Former    Types:  Cigarettes    Quit date: 07/03/2018    Years since quitting: 2.8   Smokeless tobacco: Never  Vaping Use   Vaping Use: Former  Substance and Sexual Activity   Alcohol use: Not Currently    Comment: 2 drinks/month   Drug use: Never    Types: Marijuana   Sexual activity: Yes    Birth control/protection: None  Other Topics Concern   Not on file  Social History Narrative   ** Merged History Encounter **       Social Determinants of Health   Financial Resource Strain: Not on file  Food Insecurity: Not on file  Transportation Needs: Not on file  Physical Activity: Not on file  Stress: Not on file  Social Connections: Not on file  Intimate Partner Violence: Not on file    Allergies:  No Known Allergies  Medications: Prior to Admission medications   Medication Sig Start Date End Date Taking? Authorizing Provider  ibuprofen (ADVIL) 200 MG tablet Take 200 mg by mouth every 6 (six) hours as needed.   Yes [provider]  NIFEdipine (PROCARDIA-XL/NIFEDICAL-XL) 30 MG 24 hr tablet Take 1 tablet (30 mg total) by mouth daily. Can increase to twice a day as needed for symptomatic contractions 05/04/21  Yes Gae Dry,  MD  Prenatal Vit-Fe Fumarate-FA (PRENATAL VITAMINS PO) Take by mouth.   Yes [provider]    Review of Systems  Constitutional:  Negative for chills, fever and malaise/fatigue.  HENT:  Negative for congestion, sinus pain and sore throat.   Eyes:  Negative for blurred vision and pain.  Respiratory:  Negative for cough and wheezing.   Cardiovascular:  Negative for chest pain and leg swelling.  Gastrointestinal:  Negative for abdominal pain, constipation, diarrhea, heartburn, nausea and vomiting.  Genitourinary:  Negative for dysuria, frequency, hematuria and urgency.  Musculoskeletal:  Negative for back pain, joint pain, myalgias and neck pain.  Skin:  Negative for itching and rash.  Neurological:  Positive for headaches. Negative for dizziness,  tremors and weakness.  Endo/Heme/Allergies:  Does not bruise/bleed easily.  Psychiatric/Behavioral:  Negative for depression. The patient is not nervous/anxious and does not have insomnia.    Physical Exam Blood pressure 120/70, height 5' (1.524 m), weight 174 lb (78.9 kg), currently breastfeeding.  General: NAD HEENT: normocephalic, anicteric Pulmonary: No increased work of breathing Cardiovascular: RRR, distal pulses 2+ Extremities: noedema, no erythema, no tenderness Neurologic: Grossly intact Psychiatric: mood appropriate, affect full  Assessment: 25 y.o. [id number] presenting for blood pressure evaluation today  Plan: Problem List Items Addressed This Visit     Hypertension, postpartum condition or complication    -  Primary   Relevant Medications   NIFEdipine (PROCARDIA-XL/NIFEDICAL-XL) 30 MG 24 hr tablet   Other Relevant Orders   Protein / creatinine ratio, urine   CBC   Comp Met (CMET)     1) Blood pressure - blood pressure at today's visit is normotensive. However, she has home BP checks w elevations, sx's such as headaches, and h/o gest HTN. Will treat w Procardia to assist sx's and prevent BPs from spiking. - additional blood work was obtained including:, CBC, CMP, and P/C ratio  Barnett Applebaum, MD, [place], [facility] 05/04/2021  4:03 PM

## 2021-05-04 NOTE — Telephone Encounter (Signed)
Work in BP check today

## 2021-05-04 NOTE — Patient Instructions (Signed)
Nifedipine Extended-Release Oral Tablets What is this medication? NIFEDIPINE (nye FED i peen) is a calcium channel blocker. It relaxes your blood vessels and decreases the amount of work the heart has to do. It treats high blood pressure and/or prevents chest pain (also called angina). This medicine may be used for other purposes; ask your health care provider or pharmacist if you have questions. COMMON BRAND NAME(S): Adalat CC, Afeditab CR, Nifediac CC, Nifedical XL, Procardia XL What should I tell my care team before I take this medication? They need to know if you have any of these conditions: blockage in your bowels constipation heart attack heart disease heart failure liver disease low blood pressure an unusual or allergic reaction to nifedipine, other drugs, foods, dyes or preservatives pregnant or trying to get pregnant breast-feeding How should I use this medication? Take this drug by mouth. Take it as directed on the prescription label at the same time every day. Do not cut, crush or chew this drug. Swallow the tablets whole. Some tablets need to be taken on an empty stomach. Ask your pharmacist or health care provider if you have any questions. Keep taking it unless your health care provider tells you to stop. Do not take this drug with grapefruit juice. Talk to your health care provider about the use of this drug in children. Special care may be needed. Overdosage: If you think you have taken too much of this medicine contact a poison control center or emergency room at once. NOTE: This medicine is only for you. Do not share this medicine with others. What if I miss a dose? If you miss a dose, take it as soon as you can. If it is almost time for your next dose, take only that dose. Do not take double or extra doses. What may interact with this medication? Do not take this medicine with any of the following medications: certain medicines for seizures like carbamazepine,  phenobarbital, phenytoin lumacaftor; ivacaftor rifabutin rifampin rifapentine St. John's Wort This medicine may also interact with the following medications: antiviral medicines for HIV or AIDS certain medicines for blood pressure certain medicines for diabetes certain medicines for erectile dysfunction certain medicines for fungal infections like ketoconazole, fluconazole, and itraconazole certain medicines for irregular heart beat like flecainide and quinidine certain medicines that treat or prevent blood clots like warfarin clarithromycin digoxin dolasetron erythromycin fluoxetine grapefruit juice local or general anesthetics nefazodone orlistat quinupristin; dalfopristin sirolimus stomach acid blockers like cimetidine, ranitidine, omeprazole, or pantoprazole tacrolimus valproic acid This list may not describe all possible interactions. Give your health care provider a list of all the medicines, herbs, non-prescription drugs, or dietary supplements you use. Also tell them if you smoke, drink alcohol, or use illegal drugs. Some items may interact with your medicine. What should I watch for while using this medication? Visit your health care provider for regular checks on your progress. Check your blood pressure as directed. Ask your health care provider what your blood pressure should be. Also, find out when you should contact him or her. Do not treat yourself for coughs, colds, or pain while you are using this drug without asking your health care provider for advice. Some drugs may increase your blood pressure. You may get drowsy or dizzy. Do not drive, use machinery, or do anything that needs mental alertness until you know how this drug affects you. Do not stand up or sit up quickly, especially if you are an older patient. This reduces the risk of dizzy  or fainting spells. The tablet shell for some brands of this drug does not dissolve. This is normal. The tablet shell may  appear whole in the stool. This is not a cause for concern. What side effects may I notice from receiving this medication? Side effects that you should report to your doctor or health care provider as soon as possible: allergic reactions (skin rash, itching or hives; swelling of the face, lips, or tongue) heart attack (trouble breathing; pain or tightness in the chest, neck, back or arms; unusually weak or tired) heart failure (trouble breathing; fast, irregular heartbeat; sudden weight gain; swelling of the ankles, feet, hands; unusually weak or tired) low blood pressure (dizziness; feeling faint or lightheaded, falls; unusually weak or tired) Side effects that usually do not require medical attention (report to your doctor or health care provider if they continue or are bothersome): bloating changes in emotions or moods constipation facial flushing headache nasal congestion (like runny or stuffy nose) nausea stomach pain This list may not describe all possible side effects. Call your doctor for medical advice about side effects. You may report side effects to FDA at 1-800-FDA-1088. Where should I keep my medication? Keep out of the reach of children and pets. Store at room temperature between 20 and 25 degrees C (68 and 77 degrees F). Protect from light and moisture. Keep the container tightly closed. Throw away any unused drug after the expiration date. NOTE: This sheet is a summary. It may not cover all possible information. If you have questions about this medicine, talk to your doctor, pharmacist, or health care provider.  2022 Elsevier/Gold Standard (2021-01-27 00:00:00)

## 2021-05-05 LAB — CBC
Hematocrit: 45.6 % (ref 34.0–46.6)
Hemoglobin: 15.1 g/dL (ref 11.1–15.9)
MCH: 29.2 pg (ref 26.6–33.0)
MCHC: 33.1 g/dL (ref 31.5–35.7)
MCV: 88 fL (ref 79–97)
Platelets: 225 10*3/uL (ref 150–450)
RBC: 5.17 x10E6/uL (ref 3.77–5.28)
RDW: 12.8 % (ref 11.7–15.4)
WBC: 7.4 10*3/uL (ref 3.4–10.8)

## 2021-05-05 LAB — COMPREHENSIVE METABOLIC PANEL
ALT: 11 IU/L (ref 0–32)
AST: 14 IU/L (ref 0–40)
Albumin/Globulin Ratio: 1.7 (ref 1.2–2.2)
Albumin: 4 g/dL (ref 3.9–5.0)
Alkaline Phosphatase: 107 IU/L (ref 44–121)
BUN/Creatinine Ratio: 26 — ABNORMAL HIGH (ref 9–23)
BUN: 19 mg/dL (ref 6–20)
Bilirubin Total: 0.9 mg/dL (ref 0.0–1.2)
CO2: 21 mmol/L (ref 20–29)
Calcium: 8.8 mg/dL (ref 8.7–10.2)
Chloride: 106 mmol/L (ref 96–106)
Creatinine, Ser: 0.72 mg/dL (ref 0.57–1.00)
Globulin, Total: 2.3 g/dL (ref 1.5–4.5)
Glucose: 82 mg/dL (ref 70–99)
Potassium: 4.6 mmol/L (ref 3.5–5.2)
Sodium: 140 mmol/L (ref 134–144)
Total Protein: 6.3 g/dL (ref 6.0–8.5)
eGFR: 119 mL/min/{1.73_m2} (ref >59)

## 2021-05-05 LAB — PROTEIN / CREATININE RATIO, URINE
Creatinine, Urine: 131.1 mg/dL
Protein, Ur: 15.8 mg/dL
Protein/Creat Ratio: 121 mg/g creat (ref 0–200)

## 2021-05-05 NOTE — Progress Notes (Signed)
Obstetrics & Gynecology Office Visit   Chief Complaint:  Chief Complaint  Patient presents with   Post-op Follow-up    BP check - RM 4    History of Present Illness: 25 y.o. G3P3003 being seen for follow up blood pressure check today.  The patient is postpartum.  The established diagnosis for the patient is gestational hypertension.  She is currently on nifedipine ER 30mg .  She reports no current symptoms attributable to her blood pressure.  Medication list reviewed medications which may contribute to BP elevation were not noted and no medications contraindicated for use in patient with current hypertension were noted.  Review of Systems: review of systems negative unless noted in HPI  Past Medical History:  Past Medical History:  Diagnosis Date   Anxiety    Anxiety and depression    Depressive disorder    GERD (gastroesophageal reflux disease)    Hyperthyroidism 2019   Possible Graves disease   IBS (irritable bowel syndrome)    Irritable bowel syndrome (IBS)    Thyroid disease     Past Surgical History:  Past Surgical History:  Procedure Laterality Date   NO PAST SURGERIES      Gynecologic History: No LMP recorded.  Obstetric History: 2020  Family History:  Family History  Problem Relation Age of Onset   Breast cancer Other         mggm   Hypertension Mother    Hyperlipidemia Father    Thyroid disease Maternal Grandmother    Dementia Maternal Grandmother    Heart attack Maternal Grandfather    Diabetes Paternal Grandmother    Alcoholism Paternal Grandmother    Cancer Paternal Grandfather        pancreatic   Healthy Brother    Hypothyroidism Maternal Aunt    Hypothyroidism Maternal Aunt    Breast cancer Maternal Aunt        30s    Social History:  Social History   Socioeconomic History   Marital status: Married    Spouse name: [id number]   Number of children: 1   Years of education: Not on file   Highest education level: Not on file   Occupational History   Not on file  Tobacco Use   Smoking status: Former    Types: Cigarettes    Quit date: 07/03/2018    Years since quitting: 2.8   Smokeless tobacco: Never  Vaping Use   Vaping Use: Former  Substance and Sexual Activity   Alcohol use: Not Currently    Comment: 2 drinks/month   Drug use: Never    Types: Marijuana   Sexual activity: Yes    Birth control/protection: None  Other Topics Concern   Not on file  Social History Narrative   ** Merged History Encounter **       Social Determinants of Health   Financial Resource Strain: Not on file  Food Insecurity: Not on file  Transportation Needs: Not on file  Physical Activity: Not on file  Stress: Not on file  Social Connections: Not on file  Intimate Partner Violence: Not on file    Allergies:  No Known Allergies  Medications: Prior to Admission medications   Medication Sig Start Date End Date Taking? Authorizing Provider  ibuprofen (ADVIL) 200 MG tablet Take 200 mg by mouth every 6 (six) hours as needed.   Yes [provider]  Prenatal Vit-Fe Fumarate-FA (PRENATAL VITAMINS PO) Take by mouth.   Yes [provider]  NIFEdipine (  PROCARDIA-XL/NIFEDICAL-XL) 30 MG 24 hr tablet Take 1 tablet (30 mg total) by mouth daily. Can increase to twice a day as needed for symptomatic contractions 05/04/21   Nadara Mustard, MD    Physical Exam Blood pressure 128/76, height 5' (1.524 m), weight 175 lb (79.4 kg), currently breastfeeding.  No LMP recorded.  General: NAD HEENT: normocephalic, anicteric Pulmonary: No increased work of breathing Extremities: noedema, no erythema, no tenderness Neurologic: Grossly intact Psychiatric: mood appropriate, affect full  Assessment: 25 y.o. [id number] presenting for blood pressure evaluation today  Plan: Problem List Items Addressed This Visit   None Visit Diagnoses     BP check    -  Primary       1) Blood pressure - blood pressure at today's  visit is normotensive.  As a result  can probablydiscontinue nifedipine . - additional blood work was not obtained   Vena Austria, MD, [facility], [facility] 05/05/2021, 8:00 AM

## 2021-05-05 NOTE — Telephone Encounter (Signed)
Patient is scheduled 05/04/21 with PH

## 2021-05-12 ENCOUNTER — Other Ambulatory Visit: Payer: Self-pay

## 2021-06-01 ENCOUNTER — Ambulatory Visit
Admission: EM | Admit: 2021-06-01 | Discharge: 2021-06-01 | Disposition: A | Discharge status: Home / Self Care | Payer: Commercial Managed Care - PPO | Attending: Emergency Medicine | Admitting: Emergency Medicine

## 2021-06-01 ENCOUNTER — Encounter: Payer: Self-pay | Admitting: Emergency Medicine

## 2021-06-01 DIAGNOSIS — J029 Acute pharyngitis, unspecified: Secondary | ICD-10-CM

## 2021-06-01 DIAGNOSIS — H9201 Otalgia, right ear: Secondary | ICD-10-CM

## 2021-06-01 LAB — POCT RAPID STREP A (OFFICE): Rapid Strep A Screen: NEGATIVE

## 2021-06-01 NOTE — ED Provider Notes (Signed)
Roderic Palau    CSN: [id number] Arrival date & time: 06/01/21  1818      History   Chief Complaint Chief Complaint  Patient presents with   Otalgia   Sore Throat    HPI Debra Hinton is a 26 y.o. female.  Patient presents with right ear pain and sore throat since last night.  No fever, rash, cough, shortness of breath, vomiting, diarrhea, or other symptoms.  Treatment at home with ibuprofen.  Her medical history includes IBS, GERD, hyperthyroidism, Graves disease, depression, anxiety.  The history is provided by the patient and medical records.   Past Medical History:  Diagnosis Date   Anxiety    Anxiety and depression    Depressive disorder    GERD (gastroesophageal reflux disease)    Hyperthyroidism 2019   Possible Graves disease   IBS (irritable bowel syndrome)    Irritable bowel syndrome (IBS)    Thyroid disease     Patient Active Problem List   Diagnosis Date Noted   Labor and delivery indication for care or intervention 04/25/2021   Gestational hypertension, third trimester    Oligohydramnios in third trimester    Non-reassuring fetal status, delivered, current hospitalization    Encounter for elective induction of labor 04/24/2021   History of prior pregnancy with IUGR 02/09/2021   Supervision of high risk pregnancy in second trimester 11/03/2020   Graves disease 07/26/2018   GERD (gastroesophageal reflux disease) 05/31/2018   Hyperthyroidism 04/10/2018   Irritable bowel syndrome with constipation 02/22/2018   Generalized anxiety disorder 02/22/2018   Vitamin D deficiency 02/22/2018    Past Surgical History:  Procedure Laterality Date   NO PAST SURGERIES      OB History     Gravida  3   Para  3   Term  3   Preterm  0   AB  0   Living  3      SAB  0   IAB  0   Ectopic  0   Multiple  0   Live Births  3            Home Medications    Prior to Admission medications   Medication Sig Start Date End Date Taking?  Authorizing Provider  NIFEdipine (PROCARDIA-XL/NIFEDICAL-XL) 30 MG 24 hr tablet Take 1 tablet (30 mg total) by mouth daily. Can increase to twice a day as needed for symptomatic contractions 05/04/21  Yes Gae Dry, MD  ibuprofen (ADVIL) 200 MG tablet Take 200 mg by mouth every 6 (six) hours as needed.    [provider]  Prenatal Vit-Fe Fumarate-FA (PRENATAL VITAMINS PO) Take by mouth.    [provider]    Family History Family History  Problem Relation Age of Onset   Breast cancer Other         mggm   Hypertension Mother    Hyperlipidemia Father    Thyroid disease Maternal Grandmother    Dementia Maternal Grandmother    Heart attack Maternal Grandfather    Diabetes Paternal Grandmother    Alcoholism Paternal Grandmother    Cancer Paternal Grandfather        pancreatic   Healthy Brother    Hypothyroidism Maternal Aunt    Hypothyroidism Maternal Aunt    Breast cancer Maternal Aunt        30s    Social History Social History   Tobacco Use   Smoking status: Former    Types: Cigarettes  Quit date: 07/03/2018    Years since quitting: 2.9   Smokeless tobacco: Never  Vaping Use   Vaping Use: Former  Substance Use Topics   Alcohol use: Not Currently    Comment: 2 drinks/month   Drug use: Never    Types: Marijuana     Allergies   Patient has no known allergies.   Review of Systems Review of Systems  Constitutional:  Negative for chills and fever.  HENT:  Positive for ear pain and sore throat. Negative for ear discharge and hearing loss.   Respiratory:  Negative for cough and shortness of breath.   Cardiovascular:  Negative for chest pain and palpitations.  Gastrointestinal:  Negative for diarrhea and vomiting.  Skin:  Negative for color change and rash.  All other systems reviewed and are negative.   Physical Exam Triage Vital Signs ED Triage Vitals  Enc Vitals Group     BP 06/01/21 1828 128/90     Pulse Rate 06/01/21 1828 87      Resp 06/01/21 1828 18     Temp 06/01/21 1828 98.6 F (37 C)     Temp src --      SpO2 06/01/21 1828 97 %     Weight --      Height --      Head Circumference --      Peak Flow --      Pain Score 06/01/21 1849 5     Pain Loc --      Pain Edu? --      Excl. in [place]? --    No data found.  Updated Vital Signs BP 128/90 (BP Location: Left Arm)    Pulse 87    Temp 98.6 F (37 C)    Resp 18    SpO2 97%    Breastfeeding Yes   Visual Acuity Right Eye Distance:   Left Eye Distance:   Bilateral Distance:    Right Eye Near:   Left Eye Near:    Bilateral Near:     Physical Exam Vitals and nursing note reviewed.  Constitutional:      General: She is not in acute distress.    Appearance: She is well-developed. She is not ill-appearing.  HENT:     Right Ear: Tympanic membrane and ear canal normal.     Left Ear: Tympanic membrane and ear canal normal.     Nose: Nose normal.     Mouth/Throat:     Mouth: Mucous membranes are moist.     Pharynx: Oropharynx is clear.  Eyes:     Conjunctiva/sclera: Conjunctivae normal.  Cardiovascular:     Rate and Rhythm: Normal rate and regular rhythm.     Heart sounds: Normal heart sounds.  Pulmonary:     Effort: Pulmonary effort is normal. No respiratory distress.     Breath sounds: Normal breath sounds.  Musculoskeletal:     Cervical back: Neck supple.  Skin:    General: Skin is warm and dry.  Neurological:     Mental Status: She is alert.  Psychiatric:        Mood and Affect: Mood normal.        Behavior: Behavior normal.     UC Treatments / Results  Labs (all labs ordered are listed, but only abnormal results are displayed) Labs Reviewed  COVID-19, FLU A+B NAA  POCT RAPID STREP A (OFFICE)    EKG   Radiology No results found.  Procedures Procedures (including critical care time)  Medications Ordered in UC Medications - No data to display  Initial Impression / Assessment and Plan / UC Course  I have reviewed the triage  vital signs and the nursing notes.  Pertinent labs & imaging results that were available during my care of the patient were reviewed by me and considered in my medical decision making (see chart for details).    Sore throat, right otalgia.  Rapid strep negative.  COVID and flu pending.  Instructed patient to self quarantine per CDC guidelines.  Discussed symptomatic treatment including Tylenol or ibuprofen, rest, hydration.  Instructed patient to follow up with PCP if symptoms are not improving.  Patient agrees to plan of care.   Final Clinical Impressions(s) / UC Diagnoses   Final diagnoses:  Sore throat  Right ear pain     Discharge Instructions      Your rapid strep test is negative.   Your COVID and Flu tests are pending.  You should self quarantine until the test results are back.    Take Tylenol or ibuprofen as needed for fever or discomfort.  Rest and keep yourself hydrated.    Follow-up with your primary care provider if your symptoms are not improving.            ED Prescriptions   None    PDMP not reviewed this encounter.   Sharion Balloon, NP 06/01/21 [phone]

## 2021-06-01 NOTE — Discharge Instructions (Addendum)
Your rapid strep test is negative.  Your COVID and Flu tests are pending.  You should self quarantine until the test results are back.   ° °Take Tylenol or ibuprofen as needed for fever or discomfort.  Rest and keep yourself hydrated.   ° °Follow-up with your primary care provider if your symptoms are not improving.   ° ° °

## 2021-06-01 NOTE — ED Triage Notes (Signed)
Pt presents with right ear pain and right side throat pain started last night.

## 2021-06-04 LAB — COVID-19, FLU A+B NAA
Influenza A, NAA: NOT DETECTED
Influenza B, NAA: NOT DETECTED
SARS-CoV-2, NAA: NOT DETECTED

## 2021-06-09 ENCOUNTER — Other Ambulatory Visit: Payer: Self-pay

## 2021-06-09 ENCOUNTER — Encounter: Payer: Self-pay | Admitting: Obstetrics and Gynecology

## 2021-06-09 ENCOUNTER — Other Ambulatory Visit (HOSPITAL_COMMUNITY)
Admission: RE | Admit: 2021-06-09 | Discharge: 2021-06-09 | Disposition: A | Discharge status: Home / Self Care | Payer: Commercial Managed Care - PPO | Source: Ambulatory Visit | Attending: Obstetrics and Gynecology | Admitting: Obstetrics and Gynecology

## 2021-06-09 ENCOUNTER — Ambulatory Visit (INDEPENDENT_AMBULATORY_CARE_PROVIDER_SITE_OTHER): Payer: Commercial Managed Care - PPO | Admitting: Obstetrics and Gynecology

## 2021-06-09 VITALS — BP 120/80 | Ht 60.0 in | Wt 180.0 lb

## 2021-06-09 DIAGNOSIS — Z124 Encounter for screening for malignant neoplasm of cervix: Secondary | ICD-10-CM | POA: Insufficient documentation

## 2021-06-09 DIAGNOSIS — Z1332 Encounter for screening for maternal depression: Secondary | ICD-10-CM

## 2021-06-09 NOTE — Progress Notes (Signed)
Postpartum Visit  Chief Complaint: No chief complaint on file.   History of Present Illness: Patient is a 26 y.o. [id number] presents for postpartum visit.   Date of delivery: .04/25/2021 Vaginal delivery - Vacuum or forceps assisted  no Episiotomy No.  Laceration: no  Pregnancy or labor problems:  yes GHTN Any problems since the delivery:  no  Newborn Details:  SINGLETON :  1. BabyGender female. Birth weight:   Maternal Details:  Breast or formula feeding: plans to breastfeed Contraception after delivery: No  Any bowel or bladder issues: No  Post partum depression/anxiety noted:  no Date of last PAP: 05/31/2018 no abnormalities   Review of Systems: Review of Systems  Constitutional: Negative.   Gastrointestinal: Negative.   Genitourinary: Negative.   Psychiatric/Behavioral: Negative.     The following portions of the patient's history were reviewed and updated as appropriate: allergies, current medications, past family history, past medical history, past social history, past surgical history, and problem list.  Past Medical History:  Past Medical History:  Diagnosis Date   Anxiety    Anxiety and depression    Depressive disorder    GERD (gastroesophageal reflux disease)    Hyperthyroidism 2019   Possible Graves disease   IBS (irritable bowel syndrome)    Irritable bowel syndrome (IBS)    Thyroid disease     Past Surgical History:  Past Surgical History:  Procedure Laterality Date   NO PAST SURGERIES      Family History:  Family History  Problem Relation Age of Onset   Breast cancer Other         mggm   Hypertension Mother    Hyperlipidemia Father    Thyroid disease Maternal Grandmother    Dementia Maternal Grandmother    Heart attack Maternal Grandfather    Diabetes Paternal Grandmother    Alcoholism Paternal Grandmother    Cancer Paternal Grandfather        pancreatic   Healthy Brother    Hypothyroidism Maternal Aunt    Hypothyroidism Maternal  Aunt    Breast cancer Maternal Aunt        30s    Social History:  Social History   Socioeconomic History   Marital status: Married    Spouse name: Jill Alexanders   Number of children: 1   Years of education: Not on file   Highest education level: Not on file  Occupational History   Not on file  Tobacco Use   Smoking status: Former    Types: Cigarettes    Quit date: 07/03/2018    Years since quitting: 2.9   Smokeless tobacco: Never  Vaping Use   Vaping Use: Former  Substance and Sexual Activity   Alcohol use: Not Currently    Comment: 2 drinks/month   Drug use: Never    Types: Marijuana   Sexual activity: Yes    Birth control/protection: None  Other Topics Concern   Not on file  Social History Narrative   ** Merged History Encounter **       Social Determinants of Health   Financial Resource Strain: Not on file  Food Insecurity: Not on file  Transportation Needs: Not on file  Physical Activity: Not on file  Stress: Not on file  Social Connections: Not on file  Intimate Partner Violence: Not on file    Allergies:  No Known Allergies  Medications: Prior to Admission medications   Medication Sig Start Date End Date Taking? Authorizing Provider  ibuprofen (ADVIL)  200 MG tablet Take 200 mg by mouth every 6 (six) hours as needed.    [provider]  NIFEdipine (PROCARDIA-XL/NIFEDICAL-XL) 30 MG 24 hr tablet Take 1 tablet (30 mg total) by mouth daily. Can increase to twice a day as needed for symptomatic contractions 05/04/21   Nadara Mustard, MD  Prenatal Vit-Fe Fumarate-FA (PRENATAL VITAMINS PO) Take by mouth.    [provider]    Physical Exam Blood pressure 120/80, height 5' (1.524 m), weight 180 lb (81.6 kg), last menstrual period 06/03/2021, currently breastfeeding.      General: NAD HEENT: normocephalic, anicteric Pulmonary: No increased work of breathing Abdomen: NABS, soft, non-tender, non-distended.  Umbilicus without lesions.  No  hepatomegaly, splenomegaly or masses palpable. No evidence of hernia. Genitourinary:  External: Normal external female genitalia.  Normal urethral meatus, normal  Bartholin's and Skene's glands.    Vagina: Normal vaginal mucosa, no evidence of prolapse.    Cervix: Grossly normal in appearance, no bleeding  Uterus: Non-enlarged, mobile, normal contour.  No CMT  Adnexa: ovaries non-enlarged, no adnexal masses  Rectal: deferred Extremities: no edema, erythema, or tenderness Neurologic: Grossly intact Psychiatric: mood appropriate, affect full    Assessment: 26 y.o. [id number] presenting for 6 week postpartum visit  Plan: Problem List Items Addressed This Visit   None    1) Contraception - Education given regarding options for contraception, as well as compatibility with breast feeding if applicable.  Patient plans on vasectomy for contraception.  2)  Pap - ASCCP guidelines and rational discussed.  ASCCP guidelines and rational discussed.  Patient opts for every 3 years screening interval  3) Patient underwent screening for postpartum depression with no signs of depression  4) No follow-ups on file.   Vena Austria, MD, [facility], [facility] 06/09/2021, 1:22 PM

## 2021-06-15 LAB — CYTOLOGY - PAP
Comment: NEGATIVE
Diagnosis: NEGATIVE
High risk HPV: NEGATIVE

## 2022-03-17 ENCOUNTER — Encounter: Payer: Self-pay | Admitting: Internal Medicine

## 2022-03-17 LAB — HEPATITIS C ANTIBODY: HCV Ab: NEGATIVE

## 2022-03-17 LAB — OB RESULTS CONSOLE HEPATITIS B SURFACE ANTIGEN: Hepatitis B Surface Ag: NEGATIVE

## 2022-03-17 LAB — OB RESULTS CONSOLE HIV ANTIBODY (ROUTINE TESTING): HIV: NONREACTIVE

## 2022-03-18 LAB — OB RESULTS CONSOLE RUBELLA ANTIBODY, IGM: Rubella: IMMUNE

## 2022-03-22 ENCOUNTER — Encounter: Payer: Self-pay | Admitting: Internal Medicine

## 2022-03-22 ENCOUNTER — Ambulatory Visit (INDEPENDENT_AMBULATORY_CARE_PROVIDER_SITE_OTHER): Payer: Medicaid Other | Admitting: Internal Medicine

## 2022-03-22 VITALS — BP 112/68 | HR 98 | Ht 60.0 in | Wt 176.0 lb

## 2022-03-22 DIAGNOSIS — E0590 Thyrotoxicosis, unspecified without thyrotoxic crisis or storm: Secondary | ICD-10-CM

## 2022-03-22 DIAGNOSIS — E0500 Thyrotoxicosis with diffuse goiter without thyrotoxic crisis or storm: Secondary | ICD-10-CM

## 2022-03-22 NOTE — Progress Notes (Unsigned)
Name: Debra Hinton  MRN/ DOB: [id number], [birth year]    Age/ Sex: 26 y.o., female     PCP: Ronnell Freshwater, NP   Reason for Endocrinology Evaluation: Hyperthyroidism     Initial Endocrinology Clinic Visit:  04/10/2018    PATIENT IDENTIFIER: Debra Hinton is a 26 y.o., female with a past medical history of IBS, GAD, and Graves' disease. She has followed with [facility] since 04/10/2018 for consultative assistance with management of her Hyperthyroidism .   HISTORICAL SUMMARY:  [name], 2019 with c/o constipation alternating with diarrhea as well as anxiety and weight gain. Her Labs revealed a suppressed  TSH at < 0.006 uIU/mL and elevated FT4 and T3. A thyroid ultrasound was unrevealing.    On her initial visit to our office in November, 2020 her TSH continued to be low but with normalization of her FT4. We decided to withhold therapy due to improvement in her TFT's    By 2020 she became pregnant and was started on PTU but was lost to follow-up until her return to our office in 10/2020 when she was [redacted] weeks pregnant at the time with a suppressed TSH 0.006 uIU/mL     She was lost to follow-up by November 2022 when she was 36.6 weeks of gestation the time until her return to our clinic in October 2023 when she was in her first trimester   She is S/P vaginal delivery 04/2021   FH with thyroid disease. SUBJECTIVE:     Today (03/22/2022):  Debra Hinton is here for a follow-up on Graves' disease during pregnancy. She is G4 P3  She is currently at 13 weeks of gestation . She is accompanied by her two children.   Weight is stable  Denies palpitations  She denies diarrhea /loose stools Denies tremors  Has noted some heat intolerance   Has mild nausea in the morning , denies vomiting  She follows at the Gyn with  [facility]    PTU caused chemical taste     HISTORY:  Past Medical  History:  Past Medical History:  Diagnosis Date   Anxiety    Anxiety and depression    Depressive disorder    GERD (gastroesophageal reflux disease)    Hyperthyroidism 2019   Possible Graves disease   IBS (irritable bowel syndrome)    Irritable bowel syndrome (IBS)    Thyroid disease    Past Surgical History:  Past Surgical History:  Procedure Laterality Date   NO PAST SURGERIES     Social History:  reports that she quit smoking about 3 years ago. Her smoking use included cigarettes. She has never used smokeless tobacco. She reports that she does not currently use alcohol. She reports that she does not use drugs. Family History:  Family History  Problem Relation Age of Onset   Breast cancer Other         mggm   Hypertension Mother    Hyperlipidemia Father    Thyroid disease Maternal Grandmother    Dementia Maternal Grandmother    Heart attack Maternal Grandfather    Diabetes Paternal Grandmother    Alcoholism Paternal Grandmother    Cancer Paternal Grandfather        pancreatic   Healthy Brother    Hypothyroidism Maternal Aunt    Hypothyroidism Maternal Aunt    Breast cancer Maternal Aunt        88s  HOME MEDICATIONS: Allergies as of 03/22/2022   No Known Allergies      Medication List        Accurate as of March 22, 2022 12:29 PM. If you have any questions, ask your nurse or doctor.          STOP taking these medications    NIFEdipine 30 MG 24 hr tablet Commonly known as: PROCARDIA-XL/NIFEDICAL-XL Stopped by: Scarlette Shorts, MD       TAKE these medications    ibuprofen 200 MG tablet Commonly known as: ADVIL Take 200 mg by mouth every 6 (six) hours as needed.   PRENATAL VITAMINS PO Take by mouth.          OBJECTIVE:   PHYSICAL EXAM: VS: BP 112/68 (BP Location: Left Arm, Patient Position: Sitting, Cuff Size: Large)   Pulse 98   Ht 5' (1.524 m)   Wt 176 lb (79.8 kg)   SpO2 100%   BMI 34.37 kg/m    EXAM: General: Pt  appears well and is in NAD  Neck: General: Supple without adenopathy. Thyroid: Thyroid size normal.  No goiter or nodules appreciated. No thyroid bruit.  Lungs: Clear with good BS bilat with no rales, rhonchi, or wheezes  Heart: Auscultation: RRR.  Abdomen: Normoactive bowel sounds, soft, nontender, without masses or organomegaly palpable  Extremities:  BL LE: No pretibial edema normal ROM and strength.  Neuro: Cranial nerves: II - XII grossly intact  Motor: Normal strength throughout DTRs: 2+ and symmetric in UE without delay in relaxation phase  Mental Status: Judgment, insight: Intact Memory: Intact for recent and remote events Mood and affect: No depression, anxiety, or agitation     DATA REVIEWED: 03/17/2022 TSH <0.001 Uiu/mL  T3 ( total ) 215 ( 87-178) FT4 1.62 (066-1.14 )   Results for ZAMARA, COZAD (MRN [id number]) as of 12/22/2020 15:20  Ref. Range 04/10/2018 09:43  Thyrotropin Receptor Ab Latest Ref Range: <=16.0 % 30.4 (H)    Old records , labs and images have been reviewed.    ASSESSMENT / PLAN / RECOMMENDATIONS:   Hyperthyroidism During First Trimester:   - She is clinically euthyroid  - No local neck symptoms  - TFT's  show hyperthyroidism but NOT at a level that require treatment yet - The goal of treatment is to maintain persistent but mild hyperthyroidism in the mother in an attempt to prevent fetal hypothyroidism since the fetal thyroid is more sensitive to the action of thionamide therapy. Overtreatment of maternal hyperthyroidism can cause fetal goiter and primary hypothyroidism.  - PTU caused metallic taste int he past -Patient will have TSH, total T3, total T4 checked at her GYN office in a month and we will determine if methimazole is needed at the time   F/U in 3 months   Signed electronically by: Lyndle Herrlich, MD  [facility]  [facility] [address] [place] [place], [place] [address] Phone:  [phone] FAX: [phone]      CC: Carlean Jews, NP [address] [place] [address] Phone: [phone]  Fax: [phone]   Return to Endocrinology clinic as below: No future appointments.

## 2022-03-22 NOTE — Patient Instructions (Signed)
Please have labs monthly through November and December to include ( TSH, Total T3 and total T4)

## 2022-04-12 ENCOUNTER — Ambulatory Visit (INDEPENDENT_AMBULATORY_CARE_PROVIDER_SITE_OTHER): Payer: Medicaid Other | Admitting: Family Medicine

## 2022-04-12 ENCOUNTER — Encounter: Payer: Self-pay | Admitting: Family Medicine

## 2022-04-12 VITALS — BP 121/86 | HR 77 | Wt 182.0 lb

## 2022-04-12 DIAGNOSIS — O99282 Endocrine, nutritional and metabolic diseases complicating pregnancy, second trimester: Secondary | ICD-10-CM

## 2022-04-12 DIAGNOSIS — E0590 Thyrotoxicosis, unspecified without thyrotoxic crisis or storm: Secondary | ICD-10-CM | POA: Diagnosis not present

## 2022-04-12 DIAGNOSIS — O0990 Supervision of high risk pregnancy, unspecified, unspecified trimester: Secondary | ICD-10-CM | POA: Insufficient documentation

## 2022-04-12 DIAGNOSIS — Z3480 Encounter for supervision of other normal pregnancy, unspecified trimester: Secondary | ICD-10-CM

## 2022-04-12 DIAGNOSIS — Z3482 Encounter for supervision of other normal pregnancy, second trimester: Secondary | ICD-10-CM | POA: Diagnosis not present

## 2022-04-12 DIAGNOSIS — O0992 Supervision of high risk pregnancy, unspecified, second trimester: Secondary | ICD-10-CM | POA: Diagnosis not present

## 2022-04-12 DIAGNOSIS — Z3A16 16 weeks gestation of pregnancy: Secondary | ICD-10-CM

## 2022-04-12 DIAGNOSIS — O0993 Supervision of high risk pregnancy, unspecified, third trimester: Secondary | ICD-10-CM | POA: Insufficient documentation

## 2022-04-12 NOTE — Progress Notes (Signed)
INITIAL PRENATAL VISIT  Subjective:   Debra Hinton is being seen today for her first obstetrical visit.  This is a planned pregnancy. This is a desired pregnancy.  She is at [redacted]w[redacted]d gestation by LMP. Her obstetrical history is significant for  hyperthyroidism . Relationship with FOB: spouse, living together. Patient does intend to breast feed. Pregnancy history fully reviewed.  Patient reports no complaints.  Indications for ASA therapy (per uptodate) One of the following: Previous pregnancy with preeclampsia, especially early onset and with an adverse outcome No Multifetal gestation No Chronic hypertension No Type 1 or 2 diabetes mellitus No Chronic kidney disease No Autoimmune disease (antiphospholipid syndrome, systemic lupus erythematosus) No  Two or more of the following: Nulliparity No Obesity (body mass index >30 kg/m2) Yes Family history of preeclampsia in mother or sister No Age ?35 years No Sociodemographic characteristics (African American race, low socioeconomic level) No Personal risk factors (eg, previous pregnancy with low birth weight or small for gestational age infant, previous adverse pregnancy outcome [eg, stillbirth], interval >10 years between pregnancies) No  Indications for early GDM screening  First-degree relative with diabetes Yes BMI >30kg/m2 Yes Age > 25 Yes Previous birth of an infant weighing ?4000 g No Gestational diabetes mellitus in a previous pregnancy No Glycated hemoglobin ?5.7 percent (39 mmol/mol), impaired glucose tolerance, or impaired fasting glucose on previous testing No High-risk race/ethnicity (eg, African American, Latino, Native American, Asian American, Pacific Islander) No Previous stillbirth of unknown cause No Maternal birthweight > 9 lbs No History of cardiovascular disease No Hypertension or on therapy for hypertension No High-density lipoprotein cholesterol level <35 mg/dL (3.38 mmol/L) and/or a triglyceride level  >250 mg/dL (3.29 mmol/L) No Polycystic ovary syndrome No Physical inactivity No Other clinical condition associated with insulin resistance (eg, severe obesity, acanthosis nigricans) No Current use of glucocorticoids No   Early screening tests: FBS, A1C, Random CBG, glucose challenge   Review of Systems:   Review of Systems  Objective:    Obstetric History OB History  Gravida Para Term Preterm AB Living  4 3 3  0 0 3  SAB IAB Ectopic Multiple Live Births  0 0 0 0 3    # Outcome Date GA Lbr Len/2nd Weight Sex Delivery Anes PTL Lv  4 Current           3 Term 04/25/21 [redacted]w[redacted]d / 00:12  F Vag-Spont EPI  LIV  2 Term 02/25/19 106w4d 06:02 / 00:13 5 lb 4 oz (2.38 kg) F Vag-Spont EPI  LIV  1 Term 02/11/15 [redacted]w[redacted]d / 01:04 7 lb 8 oz (3.402 kg) M Vag-Spont EPI  LIV    Past Medical History:  Diagnosis Date   Anxiety    Anxiety and depression    Depressive disorder    GERD (gastroesophageal reflux disease)    Gestational hypertension, third trimester    Hyperthyroidism 2019   Possible Graves disease   IBS (irritable bowel syndrome)    Irritable bowel syndrome (IBS)    Thyroid disease     Past Surgical History:  Procedure Laterality Date   NO PAST SURGERIES      Current Outpatient Medications on File Prior to Visit  Medication Sig Dispense Refill   Prenatal Vit-Fe Fumarate-FA (PRENATAL VITAMINS PO) Take by mouth.     ibuprofen (ADVIL) 200 MG tablet Take 200 mg by mouth every 6 (six) hours as needed. (Patient not taking: Reported on 06/09/2021)     No current facility-administered medications on file prior to visit.  No Known Allergies  Social History:  reports that she quit smoking about 3 years ago. Her smoking use included cigarettes. She has never used smokeless tobacco. She reports that she does not currently use alcohol. She reports that she does not use drugs.  Family History  Problem Relation Age of Onset   Breast cancer Other         mggm   Hypertension Mother     Hyperlipidemia Father    Thyroid disease Maternal Grandmother    Dementia Maternal Grandmother    Heart attack Maternal Grandfather    Diabetes Paternal Grandmother    Alcoholism Paternal Grandmother    Cancer Paternal Grandfather        pancreatic   Healthy Brother    Hypothyroidism Maternal Aunt    Hypothyroidism Maternal Aunt    Breast cancer Maternal Aunt        30s    The following portions of the patient's history were reviewed and updated as appropriate: allergies, current medications, past family history, past medical history, past social history, past surgical history and problem list.  Review of Systems Review of Systems  Constitutional:  Negative for chills and fever.  HENT:  Negative for congestion and sore throat.   Eyes:  Negative for pain and visual disturbance.  Respiratory:  Negative for cough, chest tightness and shortness of breath.   Cardiovascular:  Negative for chest pain.  Gastrointestinal:  Negative for abdominal pain, diarrhea, nausea and vomiting.  Endocrine: Negative for cold intolerance and heat intolerance.  Genitourinary:  Negative for dysuria and flank pain.  Musculoskeletal:  Negative for back pain.  Skin:  Negative for rash.  Allergic/Immunologic: Negative for food allergies.  Neurological:  Negative for dizziness and light-headedness.  Psychiatric/Behavioral:  Negative for agitation.       Physical Exam:  BP 121/86   Pulse 77   Wt 182 lb (82.6 kg)   LMP 12/20/2021   BMI 35.54 kg/m  CONSTITUTIONAL: Well-developed, well-nourished female in no acute distress.  HENT:  Normocephalic, atraumatic, External right and left ear normal. Oropharynx is clear and moist EYES: Conjunctivae normal. No scleral icterus.  NECK: Normal range of motion, supple, no masses.  Normal thyroid.  SKIN: Skin is warm and dry. No rash noted. Not diaphoretic. No erythema. No pallor. MUSCULOSKELETAL: Normal range of motion. No tenderness.  No cyanosis, clubbing, or  edema.   NEUROLOGIC: Alert and oriented to person, place, and time. Normal muscle tone coordination.  PSYCHIATRIC: Normal mood and affect. Normal behavior. Normal judgment and thought content. CARDIOVASCULAR: Normal heart rate noted, regular rhythm RESPIRATORY: Clear to auscultation bilaterally. Effort and breath sounds normal, no problems with respiration noted. BREASTS: Symmetric in size. No masses, skin changes, nipple drainage, or lymphadenopathy. ABDOMEN: Soft, normal bowel sounds, no distention noted.  No tenderness, rebound or guarding. Fundal ht: 17 PELVIC: deferred FHR: WNL   Assessment:    Pregnancy: G1P0000 1. Supervision of high risk pregnancy in second trimester Transfer from [facility] - [place] MFM OB DETAIL +14 WK; Future - AFP, Serum, Open Spina Bifida  2.  Hyperthyroidism affecting pregnancy in second trimester - [place] MFM OB DETAIL +14 WK; Future - TSH - T3 - T4      Plan:     Initial labs drawn. Prenatal vitamins. Problem list reviewed and updated. Reviewed in detail the nature of the practice with collaborative care between  Genetic screening discussed: NIPS- Low risk Role of ultrasound in pregnancy discussed; Anatomy US: ordered. Amniocentesis discussed:  not indicated. Follow up in 4 weeks. Discussed clinic routines, schedule of care and testing, genetic screening options, involvement of students and residents under the direct supervision of APPs and doctors and presence of female providers. Pt verbalized understanding.  Future Appointments  Date Time Provider Department Center  05/10/2022  2:30 PM Federico Flake, MD [facility]  06/07/2022  2:30 PM [place] Bing, MD [facility]  06/10/2022 12:10 PM Shamleffer, Konrad Dolores, MD [facility] None     Federico Flake, MD 04/12/2022 3:45 PM

## 2022-04-13 ENCOUNTER — Encounter: Payer: Self-pay | Admitting: Family Medicine

## 2022-04-13 LAB — TSH: TSH: <0.005 u[IU]/mL — ABNORMAL LOW (ref 0.450–4.500)

## 2022-04-13 LAB — T3: T3, Total: 261 ng/dL — ABNORMAL HIGH (ref 71–180)

## 2022-04-13 LAB — T4: T4, Total: 17.5 ug/dL — ABNORMAL HIGH (ref 4.5–12.0)

## 2022-04-13 MED ORDER — METHIMAZOLE 5 MG PO TABS: 5.0000 mg | ORAL_TABLET | Freq: Every day | ORAL | 1 refills | Status: DC

## 2022-04-14 LAB — AFP, SERUM, OPEN SPINA BIFIDA
AFP MoM: 0.88
AFP Value: 26.2 ng/mL
Gest. Age on Collection Date: 16 weeks
Maternal Age At EDD: 27 yr
OSBR Risk 1 IN: 10000
Test Results:: NEGATIVE
Weight: 182 [lb_av]

## 2022-05-10 ENCOUNTER — Encounter: Payer: Self-pay | Admitting: Family Medicine

## 2022-05-10 ENCOUNTER — Ambulatory Visit (INDEPENDENT_AMBULATORY_CARE_PROVIDER_SITE_OTHER): Payer: Medicaid Other | Admitting: Family Medicine

## 2022-05-10 VITALS — BP 124/83 | HR 92 | Wt 187.0 lb

## 2022-05-10 DIAGNOSIS — O99282 Endocrine, nutritional and metabolic diseases complicating pregnancy, second trimester: Secondary | ICD-10-CM

## 2022-05-10 DIAGNOSIS — E0590 Thyrotoxicosis, unspecified without thyrotoxic crisis or storm: Secondary | ICD-10-CM

## 2022-05-10 DIAGNOSIS — O0992 Supervision of high risk pregnancy, unspecified, second trimester: Secondary | ICD-10-CM

## 2022-05-10 DIAGNOSIS — Z3A20 20 weeks gestation of pregnancy: Secondary | ICD-10-CM

## 2022-05-10 DIAGNOSIS — Z8759 Personal history of other complications of pregnancy, childbirth and the puerperium: Secondary | ICD-10-CM

## 2022-05-10 NOTE — Progress Notes (Signed)
ROB [redacted]w[redacted]d  Swelling in both hands.

## 2022-05-10 NOTE — Progress Notes (Signed)
   PRENATAL VISIT NOTE  Subjective:  Debra Hinton is a 26 y.o. [phone] at [redacted]w[redacted]d being seen today for ongoing prenatal care.  She is currently monitored for the following issues for this high-risk pregnancy and has Irritable bowel syndrome with constipation; Generalized anxiety disorder; Vitamin D deficiency; Hyperthyroidism; GERD (gastroesophageal reflux disease); Graves disease; History of prior pregnancy with IUGR; Pregnancy, supervision, high-risk; and Hyperthyroidism complicating pregnancy on their problem list.  Patient reports no complaints.  Contractions: Not present. Vag. Bleeding: None.  Movement: Present. Denies leaking of fluid.   The following portions of the patient's history were reviewed and updated as appropriate: allergies, current medications, past family history, past medical history, past social history, past surgical history and problem list.   Objective:   Vitals:   05/10/22 1453  BP: 124/83  Pulse: 92  Weight: 187 lb (84.8 kg)    Fetal Status: Fetal Heart Rate (bpm): 150 Fundal Height: 20 cm Movement: Present     General:  Alert, oriented and cooperative. Patient is in no acute distress.  Skin: Skin is warm and dry. No rash noted.   Cardiovascular: Normal heart rate noted  Respiratory: Normal respiratory effort, no problems with respiration noted  Abdomen: Soft, gravid, appropriate for gestational age.  Pain/Pressure: Absent     Pelvic: Cervical exam deferred        Extremities: Normal range of motion.     Mental Status: Normal mood and affect. Normal behavior. Normal judgment and thought content.   Assessment and Plan:  Pregnancy: G4P3003 at [redacted]w[redacted]d 1. Supervision of high risk pregnancy in second trimester Up to date FH appropriate Having swelling in hand, discussed elevation and use of braces  2. Hyperthyroidism affecting pregnancy in second trimester Started on meds since last visit-- Methimazole Has Endo follow up in Jan No sx currently  3.  History of prior pregnancy with IUGR Has growth [place] with MFM   Preterm labor symptoms and general obstetric precautions including but not limited to vaginal bleeding, contractions, leaking of fluid and fetal movement were reviewed in detail with the patient. Please refer to After Visit Summary for other counseling recommendations.   Return in about 4 weeks (around 06/07/2022) for MD only, Routine prenatal care.  Future Appointments  Date Time Provider Department Center  05/20/2022  8:00 AM [facility]  06/07/2022  2:30 PM [place] Bing, MD [facility]  06/10/2022 12:10 PM Shamleffer, Konrad Dolores, MD [facility] None  07/06/2022  9:35 AM [place] Bing, MD [facility]    Federico Flake, MD

## 2022-05-20 ENCOUNTER — Other Ambulatory Visit: Payer: Self-pay

## 2022-05-20 ENCOUNTER — Ambulatory Visit (HOSPITAL_BASED_OUTPATIENT_CLINIC_OR_DEPARTMENT_OTHER): Payer: Medicaid Other | Admitting: Obstetrics and Gynecology

## 2022-05-20 ENCOUNTER — Ambulatory Visit: Payer: Medicaid Other | Attending: Obstetrics and Gynecology

## 2022-05-20 DIAGNOSIS — O99342 Other mental disorders complicating pregnancy, second trimester: Secondary | ICD-10-CM | POA: Diagnosis not present

## 2022-05-20 DIAGNOSIS — O99212 Obesity complicating pregnancy, second trimester: Secondary | ICD-10-CM | POA: Insufficient documentation

## 2022-05-20 DIAGNOSIS — O0992 Supervision of high risk pregnancy, unspecified, second trimester: Secondary | ICD-10-CM

## 2022-05-20 DIAGNOSIS — E0590 Thyrotoxicosis, unspecified without thyrotoxic crisis or storm: Secondary | ICD-10-CM | POA: Diagnosis not present

## 2022-05-20 DIAGNOSIS — Z0374 Encounter for suspected problem with fetal growth ruled out: Secondary | ICD-10-CM | POA: Diagnosis present

## 2022-05-20 DIAGNOSIS — E668 Other obesity: Secondary | ICD-10-CM | POA: Diagnosis not present

## 2022-05-20 DIAGNOSIS — Z3A21 21 weeks gestation of pregnancy: Secondary | ICD-10-CM | POA: Diagnosis not present

## 2022-05-20 DIAGNOSIS — O99282 Endocrine, nutritional and metabolic diseases complicating pregnancy, second trimester: Secondary | ICD-10-CM | POA: Diagnosis not present

## 2022-05-20 DIAGNOSIS — E669 Obesity, unspecified: Secondary | ICD-10-CM

## 2022-05-20 DIAGNOSIS — O09292 Supervision of pregnancy with other poor reproductive or obstetric history, second trimester: Secondary | ICD-10-CM | POA: Diagnosis not present

## 2022-05-20 DIAGNOSIS — Z79890 Hormone replacement therapy (postmenopausal): Secondary | ICD-10-CM | POA: Insufficient documentation

## 2022-05-20 DIAGNOSIS — F419 Anxiety disorder, unspecified: Secondary | ICD-10-CM | POA: Insufficient documentation

## 2022-05-20 NOTE — Progress Notes (Signed)
Maternal-Fetal Medicine   Name: Debra Hinton DOB: Dec 24, 1995 MRN: [id number] Referring Provider: Federico Flake, MD  I had the pleasure of seeing [name] today at [facility], [facility]. She is G4 P3003 at 21w 4d gestation and is here for fetal anatomy scan. She has a diagnosis of hyperthyroidism.  Hypothyroidism was diagnosed in 2019 during her routine blood work.  She is being followed by her endocrinologist, Dr. Lonzo Cloud. Patient has been taking methimazole 5 mg daily since October 2023.  In her previous pregnancy, patient reports she did not take antithyroid medications.  No evidence of fetal hyperthyroidism in her previous pregnancy.  She does not have palpitations or weight loss or hair loss.  No ophthalmopathy.  She does not have hypertension or diabetes or any other chronic medical conditions.  Past surgical history: Nil of note. Medications: Prenatal vitamins, methimazole 5 mg daily. Allergies: No known drug allergies. Social history: Denies tobacco or drug or alcohol use.  She is married and her husband is in good health.  He is a father of all her children. Obstetrical history is significant for 3 term vaginal deliveries.  Her most recent pregnancy was complicated by gestational hypertension and she had induction of labor at [redacted] weeks gestation.  Her second pregnancy was complicated by fetal growth restriction. Prenatal course: On cell-free fetal DNA screening, the risks of fetal aneuploidies are not increased.  MSAFP screening showed low risk for open neural tube defects.  Early screening ruled out gestational diabetes.  Ultrasound We performed a fetal anatomical survey.  Amniotic fluid is normal and good fetal activity seen.  No markers of aneuploidies or fetal structural defects are seen.  Fetal biometry is consistent with the previously established dates.  Fetal heart rate and rhythm appear normal.  No evidence of fetal goiter.  Patient understands the  limitations of ultrasound in detecting fetal anomalies. Hyperthyroidism  Hyperthyroidism in pregnancy -Patient has hyperthyroidism, which is more-likely Graves' disease. 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 -Incidence of hyperthyroidism is about 6 per 1,000 pregnancies.  -Poorly treated hyperthyroidism can lead to fetal growth restriction or preterm delivery.  Fetal/neonatal hyperthyroidism complicates about 5% of pregnancies and it is increased to up to 15 to 30% if thyroid stimulating immunoglobulin (TSI) is increased. Patient has not had TSI estimation. Fetal hyperthyroidism can lead to fetal congestive heart failure, hydrops, and preterm delivery.  -I counseled her that it is important to treat hyperthyroidism in pregnancy.  Antithyroid drugs (ATD) form the first-line treatment.  In the second trimester PTU or methimazole can be given.    -PTU can be associated with liver toxicity in some patients.  Antithyroid drugs dosage should be adjusted to keep free thyroxine levels in the upper limits of normal.  It can be discontinued at [redacted] weeks gestation if T3 and FT4 levels are not increased.  -I recommend checking TSI levels every trimester.  -Beta-blockers should be added if patient has symptoms of tachycardia.  -We recommend serial fetal growth assessments every 4 weeks and weekly BPP from [redacted] weeks gestation till delivery.  If TSI is increased, ultrasound can detect fetal goiter in some cases.  -I recommend continued follow-up with her endocrinologist. I will contact Dr. Lonzo Cloud via EPIC message to discuss TSI/TRAb.  Patient's previous pregnancy was complicated by gestational hypertension.  I discussed the benefit of low-dose aspirin prophylaxis to delay or prevent preeclampsia.  Patient does not have allergy to aspirin.  I recommended that she take  aspirin 81 mg daily from now till delivery.  Recommendations -An appointment was  made for her to return in 4 weeks for fetal growth assessment. -Fetal growth assessments every 4 weeks till delivery. -Weekly BPP from [redacted] weeks gestation till delivery. -Timing of delivery will be based on control of hypothyroidism, development of gestational hypertension or growth restriction. -Continue follow-up with her endocrinologist.  Patient has her next appointment on 06/10/2022. -Continue methimazole. -I will discuss with Dr. Lonzo Cloud (EPIC) for about TSI/TRAb levels. -Aspirin 81 mg p.o. daily till delivery.  Thank you for consultation.  If you have any questions or concerns, please contact me the [facility].  Consultation including face-to-face counseling (more than 50% of time spent) is 45 minutes.

## 2022-05-24 NOTE — L&D Delivery Note (Addendum)
OB/GYN Faculty Practice Delivery Note  Debra Hinton is a 27 y.o. [phone] s/p VD at [redacted]w[redacted]d. She was admitted for IOL for gHTN.   ROM: 2h 43m with clear fluid GBS Status:  Negative/-- (04/09 1000) Maximum Maternal Temperature: 99.3  Labor Progress: Initial SVE: 1/50/-3. She then progressed to complete.   Delivery Date/Time: 09/06/22 0955 Delivery: Called to room and patient was complete and pushing. Head delivered ROA. Loose cord present around body x1, delivered through without difficulty. Shoulder and body delivered in usual fashion. Infant with spontaneous cry, placed on mother's abdomen, dried and stimulated. Cord clamped x 2 after 1-minute delay, and cut by FOB. Cord blood unable to be drawn. Placenta delivered spontaneously with gentle cord traction. Fundus firm with massage and Pitocin. Labia, perineum, vagina, and cervix inspected with 1st degree abrasion, no repair.  Baby Weight: pending  Placenta: 3 vessel, intact. Sent to L&D Complications: None Lacerations: 1st degree abrasion, no repair EBL: 100 mL Analgesia: Epidural   Infant:  APGAR (1 MIN):  8 APGAR (5 MINS):  9  Vonna Drafts, MD PGY1, Faculty Practice [facility] [employer], [facility] 09/06/2022, 10:11 AM  Attestation of Supervision of Resident:  I confirm that I have verified the information documented in the resident's note and that I have also personally performed the history, physical exam and all medical decision making activities.  I have verified that all services and findings are accurately documented in this note; and I agree with management and plan as outlined in the documentation. I have also made any necessary editorial changes.  I was gowned and gloved for duration of delivery. Of note patient had several severe range BP charted prior to shift start, which resolved without medication and adjustment of BP cuff. She denies HA, vision changes, RUQ pain, chest pain, SOB. Will start  Lasix x 5 days PP for gestational HTN. Continue to monitor UOP and BP closely, initial pre-E labs negative.   Billey Co, MD [facility] for [facility], [facility] 09/06/2022 10:27 AM

## 2022-06-07 ENCOUNTER — Ambulatory Visit (INDEPENDENT_AMBULATORY_CARE_PROVIDER_SITE_OTHER): Payer: Medicaid Other | Admitting: Obstetrics and Gynecology

## 2022-06-07 VITALS — BP 130/81 | HR 89 | Wt 187.0 lb

## 2022-06-07 DIAGNOSIS — Z3A24 24 weeks gestation of pregnancy: Secondary | ICD-10-CM

## 2022-06-07 DIAGNOSIS — O99282 Endocrine, nutritional and metabolic diseases complicating pregnancy, second trimester: Secondary | ICD-10-CM

## 2022-06-07 DIAGNOSIS — E0590 Thyrotoxicosis, unspecified without thyrotoxic crisis or storm: Secondary | ICD-10-CM

## 2022-06-07 DIAGNOSIS — O0992 Supervision of high risk pregnancy, unspecified, second trimester: Secondary | ICD-10-CM

## 2022-06-07 DIAGNOSIS — Z8759 Personal history of other complications of pregnancy, childbirth and the puerperium: Secondary | ICD-10-CM

## 2022-06-07 NOTE — Progress Notes (Signed)
ROB [redacted]w[redacted]d  CC: None

## 2022-06-08 NOTE — Progress Notes (Signed)
   PRENATAL VISIT NOTE  Subjective:  Debra Hinton is a 27 y.o. [phone] at [redacted]w[redacted]d being seen today for ongoing prenatal care.  She is currently monitored for the following issues for this high-risk pregnancy and has Irritable bowel syndrome with constipation; Generalized anxiety disorder; Vitamin D deficiency; Hyperthyroidism; GERD (gastroesophageal reflux disease); Graves disease; History of prior pregnancy with IUGR; Pregnancy, supervision, high-risk; and Hyperthyroidism complicating pregnancy on their problem list.  Patient reports no complaints.  Contractions: Irritability. Vag. Bleeding: None.  Movement: Present. Denies leaking of fluid.   The following portions of the patient's history were reviewed and updated as appropriate: allergies, current medications, past family history, past medical history, past social history, past surgical history and problem list.   Objective:   Vitals:   06/07/22 1443  BP: 130/81  Pulse: 89  Weight: 187 lb (84.8 kg)    Fetal Status: Fetal Heart Rate (bpm): 146   Movement: Present     General:  Alert, oriented and cooperative. Patient is in no acute distress.  Skin: Skin is warm and dry. No rash noted.   Cardiovascular: Normal heart rate noted  Respiratory: Normal respiratory effort, no problems with respiration noted  Abdomen: Soft, gravid, appropriate for gestational age.  Pain/Pressure: Absent     Pelvic: Cervical exam deferred        Extremities: Normal range of motion.  Edema: None  Mental Status: Normal mood and affect. Normal behavior. Normal judgment and thought content.   Assessment and Plan:  Pregnancy: G4P3003 at [redacted]w[redacted]d 1. [redacted] weeks gestation of pregnancy 28wk labs next visit; check TSI next visit if not check by endo  Continue low dose ASA.   2. Hyperthyroidism affecting pregnancy in second trimester Followed by endocrine; patient currently on MMZ 5mg  qday. She has endo appointment later this week. Ft4 1.62 in October  Followed  by MFM and has repeat u/s next week. Recommendations:  -check TSI qtrimester to see if at risk for fetal goiter -qmonth growth u/s -qwk bpp at 32wks 12/28: 56%, 451gm, ac 56%, AFI wnl  3. History of prior pregnancy with IUGR See above  4. Supervision of high risk pregnancy in second trimester  Preterm labor symptoms and general obstetric precautions including but not limited to vaginal bleeding, contractions, leaking of fluid and fetal movement were reviewed in detail with the patient. Please refer to After Visit Summary for other counseling recommendations.   Return today (on 06/07/2022) for fasting 2hr GTT.  Future Appointments  Date Time Provider [place]  06/10/2022 12:10 PM Shamleffer, Melanie Crazier, MD [facility] None  06/17/2022  8:00 AM [facility] US[facility]  07/06/2022  9:35 AM Aletha Halim, MD [facility]  07/20/2022  9:35 AM Anyanwu, Sallyanne Havers, MD [facility]  08/03/2022  9:35 AM Aletha Halim, MD [facility]  08/17/2022  9:35 AM Anyanwu, Sallyanne Havers, MD [facility]    Aletha Halim, MD

## 2022-06-09 ENCOUNTER — Emergency Department: Payer: Medicaid Other

## 2022-06-09 ENCOUNTER — Other Ambulatory Visit: Payer: Self-pay

## 2022-06-09 ENCOUNTER — Emergency Department
Admission: EM | Admit: 2022-06-09 | Discharge: 2022-06-10 | Disposition: A | Discharge status: Home / Self Care | Payer: Medicaid Other | Source: Home / Self Care | Attending: Emergency Medicine | Admitting: Emergency Medicine

## 2022-06-09 ENCOUNTER — Telehealth: Payer: Self-pay | Admitting: *Deleted

## 2022-06-09 ENCOUNTER — Encounter: Payer: Self-pay | Admitting: Emergency Medicine

## 2022-06-09 DIAGNOSIS — Z87891 Personal history of nicotine dependence: Secondary | ICD-10-CM | POA: Diagnosis not present

## 2022-06-09 DIAGNOSIS — R0789 Other chest pain: Secondary | ICD-10-CM

## 2022-06-09 DIAGNOSIS — R1011 Right upper quadrant pain: Secondary | ICD-10-CM | POA: Insufficient documentation

## 2022-06-09 DIAGNOSIS — Z3A24 24 weeks gestation of pregnancy: Secondary | ICD-10-CM | POA: Diagnosis not present

## 2022-06-09 DIAGNOSIS — O26852 Spotting complicating pregnancy, second trimester: Secondary | ICD-10-CM | POA: Diagnosis present

## 2022-06-09 DIAGNOSIS — Z79899 Other long term (current) drug therapy: Secondary | ICD-10-CM | POA: Diagnosis not present

## 2022-06-09 DIAGNOSIS — J189 Pneumonia, unspecified organism: Secondary | ICD-10-CM

## 2022-06-09 DIAGNOSIS — N939 Abnormal uterine and vaginal bleeding, unspecified: Secondary | ICD-10-CM

## 2022-06-09 DIAGNOSIS — Z1152 Encounter for screening for COVID-19: Secondary | ICD-10-CM | POA: Insufficient documentation

## 2022-06-09 DIAGNOSIS — O209 Hemorrhage in early pregnancy, unspecified: Secondary | ICD-10-CM | POA: Insufficient documentation

## 2022-06-09 DIAGNOSIS — O0992 Supervision of high risk pregnancy, unspecified, second trimester: Secondary | ICD-10-CM | POA: Diagnosis not present

## 2022-06-09 DIAGNOSIS — R0602 Shortness of breath: Secondary | ICD-10-CM | POA: Insufficient documentation

## 2022-06-09 DIAGNOSIS — Z3A23 23 weeks gestation of pregnancy: Secondary | ICD-10-CM | POA: Insufficient documentation

## 2022-06-09 DIAGNOSIS — Z7982 Long term (current) use of aspirin: Secondary | ICD-10-CM | POA: Diagnosis not present

## 2022-06-09 DIAGNOSIS — O26892 Other specified pregnancy related conditions, second trimester: Secondary | ICD-10-CM | POA: Insufficient documentation

## 2022-06-09 DIAGNOSIS — O4692 Antepartum hemorrhage, unspecified, second trimester: Secondary | ICD-10-CM | POA: Diagnosis not present

## 2022-06-09 LAB — HEPATIC FUNCTION PANEL
ALT: 14 U/L (ref 0–44)
AST: 15 U/L (ref 15–41)
Albumin: 3.1 g/dL — ABNORMAL LOW (ref 3.5–5.0)
Alkaline Phosphatase: 71 U/L (ref 38–126)
Bilirubin, Direct: <0.1 mg/dL (ref 0.0–0.2)
Total Bilirubin: 0.6 mg/dL (ref 0.3–1.2)
Total Protein: 6.6 g/dL (ref 6.5–8.1)

## 2022-06-09 LAB — CBC
HCT: 38.5 % (ref 36.0–46.0)
Hemoglobin: 13.3 g/dL (ref 12.0–15.0)
MCH: 29.5 pg (ref 26.0–34.0)
MCHC: 34.5 g/dL (ref 30.0–36.0)
MCV: 85.4 fL (ref 80.0–100.0)
Platelets: 227 10*3/uL (ref 150–400)
RBC: 4.51 MIL/uL (ref 3.87–5.11)
RDW: 13.2 % (ref 11.5–15.5)
WBC: 7.9 10*3/uL (ref 4.0–10.5)
nRBC: 0 % (ref 0.0–0.2)

## 2022-06-09 LAB — BASIC METABOLIC PANEL
Anion gap: 8 (ref 5–15)
BUN: 7 mg/dL (ref 6–20)
CO2: 22 mmol/L (ref 22–32)
Calcium: 8.5 mg/dL — ABNORMAL LOW (ref 8.9–10.3)
Chloride: 106 mmol/L (ref 98–111)
Creatinine, Ser: 0.48 mg/dL (ref 0.44–1.00)
GFR, Estimated: >60 mL/min (ref >60)
Glucose, Bld: 92 mg/dL (ref 70–99)
Potassium: 3.7 mmol/L (ref 3.5–5.1)
Sodium: 136 mmol/L (ref 135–145)

## 2022-06-09 LAB — ABO/RH: ABO/RH(D): O POS

## 2022-06-09 LAB — URINALYSIS, ROUTINE W REFLEX MICROSCOPIC
Bilirubin Urine: NEGATIVE
Glucose, UA: NEGATIVE mg/dL
Hgb urine dipstick: NEGATIVE
Ketones, ur: NEGATIVE mg/dL
Leukocytes,Ua: NEGATIVE
Nitrite: NEGATIVE
Protein, ur: NEGATIVE mg/dL
Specific Gravity, Urine: 1.009 (ref 1.005–1.030)
pH: 7 (ref 5.0–8.0)

## 2022-06-09 LAB — RESP PANEL BY RT-PCR (RSV, FLU A&B, COVID)  RVPGX2
Influenza A by PCR: NEGATIVE
Influenza B by PCR: NEGATIVE
Resp Syncytial Virus by PCR: NEGATIVE
SARS Coronavirus 2 by RT PCR: NEGATIVE

## 2022-06-09 LAB — TROPONIN I (HIGH SENSITIVITY)
Troponin I (High Sensitivity): 3 ng/L (ref <18)
Troponin I (High Sensitivity): 3 ng/L (ref <18)
Troponin I (High Sensitivity): 3 ng/L (ref <18)

## 2022-06-09 LAB — LIPASE, BLOOD: Lipase: 28 U/L (ref 11–51)

## 2022-06-09 LAB — POC URINE PREG, ED: Preg Test, Ur: POSITIVE — AB

## 2022-06-09 MED ORDER — ACETAMINOPHEN 500 MG PO TABS
1000.0000 mg | ORAL_TABLET | Freq: Once | ORAL | Status: AC
  Administered 2022-06-09: 1000 mg via ORAL
  Filled 2022-06-09: qty 2

## 2022-06-09 MED ORDER — LIDOCAINE 5 % EX PTCH
1.0000 | MEDICATED_PATCH | CUTANEOUS | Status: DC
  Administered 2022-06-09: 1 via TRANSDERMAL
  Filled 2022-06-09: qty 1

## 2022-06-09 MED ORDER — IOHEXOL 350 MG/ML SOLN
75.0000 mL | Freq: Once | INTRAVENOUS | Status: AC | PRN
  Administered 2022-06-09: 75 mL via INTRAVENOUS

## 2022-06-09 MED ORDER — SODIUM CHLORIDE 0.9 % IV SOLN
2.0000 g | Freq: Once | INTRAVENOUS | Status: AC
  Administered 2022-06-10: 2 g via INTRAVENOUS
  Filled 2022-06-09: qty 20

## 2022-06-09 NOTE — ED Notes (Signed)
Pt taken to ct 

## 2022-06-09 NOTE — Telephone Encounter (Signed)
Returned pt call from voice mail. Pt states she has had right shoulder pain that is wrapping around to her chest since around 1pm today. Pt also took her BP and it was 140/96. Pt states her mom is coming over to take it manually. Pt also has not taken anything yet for the pain. Denies and HA. Advised pt to go ahead and take some extra strength tylenol and a gas X as well incase the pain could be gas related. Also advised that if BP remains around the 140/90, to let us know and we will bring her in the office tomorrow for a BP check and possible labs, she since she does not have a HA. Also advised that if pain gets worse and she gets any SOB to go ahead and go to [facility]. But if it gets better to let us know and to let us know what her BP was. Pt verbalizes and understands

## 2022-06-09 NOTE — ED Provider Notes (Addendum)
[facility] Provider Note    Event Date/Time   First MD Initiated Contact with Patient 06/09/22 1857     (approximate)   History   Chest Pain   HPI  Debra Hinton is a 27 y.o. female who comes in for chest pain on the right side is worse with taking a deep breath.  Patient reports being [redacted] weeks pregnant.  Patient reports there was sudden onset today of pain in her right chest with some pain with taking a deep breath which was causing her to feel little bit more short of breath.  She denies ever having this previously.  Denies doing any heavy lifting.  Denies any abdominal pain.  She denies any issues with the pregnancy itself.  No vaginal bleeding gush of fluid or other concerns.  She reports baby has been moving normally.   Physical Exam   Triage Vital Signs: ED Triage Vitals  Enc Vitals Group     BP 06/09/22 1807 121/81     Pulse Rate 06/09/22 1807 100     Resp 06/09/22 1807 18     Temp 06/09/22 1807 97.8 F (36.6 C)     Temp Source 06/09/22 1807 Oral     SpO2 06/09/22 1807 97 %     Weight 06/09/22 1808 187 lb (84.8 kg)     Height 06/09/22 1808 5' (1.524 m)     Head Circumference --      Peak Flow --      Pain Score 06/09/22 1808 7     Pain Loc --      Pain Edu? --      Excl. in [place]? --     Most recent vital signs: Vitals:   06/09/22 1807  BP: 121/81  Pulse: 100  Resp: 18  Temp: 97.8 F (36.6 C)  SpO2: 97%     General: Awake, no distress.  CV:  Good peripheral perfusion.  Resp:  Normal effort.  Clear lungs Abd:  No distention.  Soft nontender Other:  No swelling in legs.  No calf tenderness   ED Results / Procedures / Treatments   Labs (all labs ordered are listed, but only abnormal results are displayed) Labs Reviewed  BASIC METABOLIC PANEL - Abnormal; Notable for the following components:      Result Value   Calcium 8.5 (*)    All other components within normal limits  POC URINE PREG, ED - Abnormal; Notable for  the following components:   Preg Test, Ur POSITIVE (*)    All other components within normal limits  RESP PANEL BY RT-PCR (RSV, FLU A&B, COVID)  RVPGX2  CBC  TROPONIN I (HIGH SENSITIVITY)     EKG  My interpretation of EKG:  Sinus tachycardia rate of 105 without any ST elevation, T wave version lead III, normal intervals  Repeat EKG is sinus rate of 80 without any ST elevation, T wave version lead III and V3, normal intervals  RADIOLOGY I have reviewed the ultrasound personally interpreted no evidence of gallstones   IMPRESSION: Unremarkable right upper quadrant ultrasound.    PROCEDURES:  Critical Care performed: No  Procedures   MEDICATIONS ORDERED IN ED: Medications  lidocaine (LIDODERM) 5 % 1 patch (1 patch Transdermal Patch Applied 06/09/22 2000)  acetaminophen (TYLENOL) tablet 1,000 mg (1,000 mg Oral Given 06/09/22 2003)  iohexol (OMNIPAQUE) 350 MG/ML injection 75 mL (75 mLs Intravenous Contrast Given 06/09/22 2214)     IMPRESSION / MDM / ASSESSMENT AND  PLAN / ED COURSE  I reviewed the triage vital signs and the nursing notes.   Patient's presentation is most consistent with acute presentation with potential threat to life or bodily function.   Patient comes in pregnant with concerns for right-sided chest pain.  Differential COVID, flu, PE, pneumonia, ACS.  Patient denies any pneumonia symptoms.  We discussed x-ray and CT.  We discussed D-dimer but most likely to be elevated in pregnancy no evidence of DVT to suggest needing ultrasounds.  We discussed pros and cons of the CT imaging and she is going to think about it.  She was worried about it being her gallbladder but explained that usually that would cause some pain in her abdomen we can get ultrasound just to ensure.  Patient does report that she had some elevated blood pressure at home but rechecks here have been normotensive.  Her urine is without evidence of any protein in her urine.  No signs of preeclampsia at  this time.  Pregnancy test was positive.  COVID, flu are negative.  Her BMP overall reassuring.  CBC normal.  Troponin negative  Repeat troponin is negative.  On repeat evaluation patient reports feeling better after the medications but only slightly.  We again rediscussed CT imaging given otherwise reassuring workup and she would like to proceed she understands the risks to fetus.  Patient did report a tiny bit of spotting.  She reports that it was from her vagina and not her urine.  Given patient is viable will discuss with OB after CT  Discussed with Dr. Feliberto Gottron given the vaginal spotting that patient had given patient is viable they would recommend the patient be discharged up to [facility] for monitoring.  Was told by the nurse that the fetal heart rate was 155.  I have called over to CT multiple times try to get the results given I am unable to see the images.  If this is negative patient will be discharged to go up to OB/GYN  They were able to print the results for me that showed no evidence of PE but bilateral peribronchial vascular groundglass opacities in tree-in-bud pattern mostly in the right consistent with pneumonia.  No pleural effusion or pneumothorax, small hiatal hernia.   Given unable to see images- I did discuss with radiology and no dissection and no aneurysm. Dr Ladona Ridgel.    Discussed these findings with patient.  Will give a dose of ceftriaxone here and treat patient with azithromycin on Augmentin at home.  Her oxygen levels have been reassuring she has not really had significant shortness of breath.  Patient feels comfortable with discharge and will follow-up outpatient and return to the ER if she develops worsening shortness of breath.  On repeat evaluation her pain is much better.  We discussed the benefits and risk of lidocaine patch and she will discuss further with OB whether or not she should take that off given I did explain to her that there was not a lot of testing  done in pregnancy.  However she does report that her pain seems to be much better.  The patient is on the cardiac monitor to evaluate for evidence of arrhythmia and/or significant heart rate changes.      FINAL CLINICAL IMPRESSION(S) / ED DIAGNOSES   Final diagnoses:  Atypical chest pain  Vaginal spotting     Rx / DC Orders   ED Discharge Orders     None        Note:  This  document was prepared using Systems analyst and may include unintentional dictation errors.   Vanessa Madeira, MD 06/09/22 2348    Vanessa [place], MD 06/10/22 0011    Vanessa [place], MD 06/10/22 [phone]

## 2022-06-09 NOTE — ED Triage Notes (Signed)
Patient to ED via [facility] from home for chest pain. Patient states pain is right sided and goes into back. Pain is worse when taking a deep breath. Patient is [redacted] weeks pregnant- normal pregnancy.    98 cbg 133/82 100% 106 HR

## 2022-06-09 NOTE — Discharge Instructions (Addendum)
We are starting you 2 different antibiotics to help with pneumonia.  You can take Tylenol 1 g every 8 hours to help with any pain.  Return to the ER if you develop worsening shortness of breath.  You be discharged to go up to OB/GYN for fetal monitoring given you had some vaginal spotting.

## 2022-06-09 NOTE — ED Notes (Addendum)
Pt complaining of having small amount of spotting when she went to use bathroom. Funke MD made aware

## 2022-06-10 ENCOUNTER — Encounter: Payer: Self-pay | Admitting: Obstetrics and Gynecology

## 2022-06-10 ENCOUNTER — Other Ambulatory Visit: Payer: Self-pay

## 2022-06-10 ENCOUNTER — Ambulatory Visit (INDEPENDENT_AMBULATORY_CARE_PROVIDER_SITE_OTHER): Payer: Medicaid Other | Admitting: Internal Medicine

## 2022-06-10 ENCOUNTER — Observation Stay
Admission: EM | Admit: 2022-06-10 | Discharge: 2022-06-10 | Disposition: A | Discharge status: Home / Self Care | Payer: Medicaid Other | Attending: Certified Nurse Midwife | Admitting: Certified Nurse Midwife

## 2022-06-10 ENCOUNTER — Encounter: Payer: Self-pay | Admitting: Internal Medicine

## 2022-06-10 VITALS — BP 114/68 | HR 90 | Ht 60.0 in | Wt 188.0 lb

## 2022-06-10 DIAGNOSIS — Z79899 Other long term (current) drug therapy: Secondary | ICD-10-CM | POA: Insufficient documentation

## 2022-06-10 DIAGNOSIS — Z1152 Encounter for screening for COVID-19: Secondary | ICD-10-CM | POA: Insufficient documentation

## 2022-06-10 DIAGNOSIS — Z3A24 24 weeks gestation of pregnancy: Secondary | ICD-10-CM | POA: Insufficient documentation

## 2022-06-10 DIAGNOSIS — Z7982 Long term (current) use of aspirin: Secondary | ICD-10-CM | POA: Insufficient documentation

## 2022-06-10 DIAGNOSIS — E0590 Thyrotoxicosis, unspecified without thyrotoxic crisis or storm: Secondary | ICD-10-CM | POA: Diagnosis not present

## 2022-06-10 DIAGNOSIS — E0500 Thyrotoxicosis with diffuse goiter without thyrotoxic crisis or storm: Secondary | ICD-10-CM | POA: Diagnosis not present

## 2022-06-10 DIAGNOSIS — O4692 Antepartum hemorrhage, unspecified, second trimester: Principal | ICD-10-CM | POA: Insufficient documentation

## 2022-06-10 DIAGNOSIS — O0992 Supervision of high risk pregnancy, unspecified, second trimester: Secondary | ICD-10-CM | POA: Insufficient documentation

## 2022-06-10 DIAGNOSIS — N898 Other specified noninflammatory disorders of vagina: Secondary | ICD-10-CM | POA: Diagnosis present

## 2022-06-10 DIAGNOSIS — Z87891 Personal history of nicotine dependence: Secondary | ICD-10-CM | POA: Insufficient documentation

## 2022-06-10 DIAGNOSIS — O26892 Other specified pregnancy related conditions, second trimester: Secondary | ICD-10-CM | POA: Diagnosis present

## 2022-06-10 LAB — WET PREP, GENITAL
Clue Cells Wet Prep HPF POC: NONE SEEN
Sperm: NONE SEEN
Trich, Wet Prep: NONE SEEN
WBC, Wet Prep HPF POC: <10 (ref <10)
Yeast Wet Prep HPF POC: NONE SEEN

## 2022-06-10 LAB — CHLAMYDIA/NGC RT PCR (ARMC ONLY)
Chlamydia Tr: NOT DETECTED
N gonorrhoeae: NOT DETECTED

## 2022-06-10 LAB — TSH: TSH: 0.02 u[IU]/mL — ABNORMAL LOW (ref 0.35–5.50)

## 2022-06-10 MED ORDER — DOCUSATE SODIUM 100 MG PO CAPS: 100.0000 mg | ORAL_CAPSULE | Freq: Every day | ORAL | Status: DC

## 2022-06-10 MED ORDER — LACTATED RINGERS IV SOLN: 125.0000 mL/h | INTRAVENOUS | Status: DC

## 2022-06-10 MED ORDER — PRENATAL MULTIVITAMIN CH: 1.0000 | ORAL_TABLET | Freq: Every day | ORAL | Status: DC

## 2022-06-10 MED ORDER — FLUCONAZOLE 150 MG PO TABS: 150.0000 mg | ORAL_TABLET | Freq: Every day | ORAL | 0 refills | Status: DC

## 2022-06-10 MED ORDER — AMOXICILLIN 500 MG PO CAPS: 1000.0000 mg | ORAL_CAPSULE | Freq: Three times a day (TID) | ORAL | 0 refills | Status: AC

## 2022-06-10 MED ORDER — ACETAMINOPHEN 325 MG PO TABS: 650.0000 mg | ORAL_TABLET | ORAL | Status: DC | PRN

## 2022-06-10 MED ORDER — ALBUTEROL SULFATE HFA 108 (90 BASE) MCG/ACT IN AERS: 2.0000 | INHALATION_SPRAY | Freq: Four times a day (QID) | RESPIRATORY_TRACT | 2 refills | Status: DC | PRN

## 2022-06-10 MED ORDER — ZOLPIDEM TARTRATE 5 MG PO TABS: 5.0000 mg | ORAL_TABLET | Freq: Every evening | ORAL | Status: DC | PRN

## 2022-06-10 MED ORDER — CALCIUM CARBONATE ANTACID 500 MG PO CHEW: 2.0000 | CHEWABLE_TABLET | ORAL | Status: DC | PRN

## 2022-06-10 MED ORDER — AZITHROMYCIN 250 MG PO TABS: ORAL_TABLET | ORAL | 0 refills | Status: AC

## 2022-06-10 NOTE — ED Notes (Addendum)
Pt discharged from ED at this time and wheeled to L&D to be registered for fetal monitoring. Report made to L&D by Liane Comber, RN to Joellen Jersey, RN.   IV to stay in place until discharge from L&D.

## 2022-06-10 NOTE — Patient Instructions (Addendum)
Will need labs in a month in February  and the month of March   TSH, Total T4 and Total 3rd

## 2022-06-10 NOTE — Discharge Summary (Addendum)
Debra Hinton is a 27 y.o. female. She is at [redacted]w[redacted]d gestation. Patient's last menstrual period was 12/20/2021. Estimated Date of Delivery: 09/26/22  Prenatal care site: [facility]  Current pregnancy complicated by:  - Hyperthyroid - History of IUGR - close interval pregnancies - obesity - anxiety  Chief complaint: vaginal spotting  She was sent up from the ER for one episode of vaginal spotting. She was diagnosed with pneumonia in the ER. While in the ER she wiped and noted one drop of pink-tinged discharge on the tissue. Denies cramping, leaking fluid, pain. She denies any further vaginal bleeding. Reports good fetal movement. She denies recent intercourse.  S: Resting comfortably. no CTX, no LOF,  Active fetal movement.  Denies: HA, visual changes, SOB, or RUQ/epigastric pain  Maternal Medical History:   Past Medical History:  Diagnosis Date   Anxiety    Anxiety and depression    Depressive disorder    GERD (gastroesophageal reflux disease)    Gestational hypertension, third trimester    Hyperthyroidism 2019   Possible Graves disease   IBS (irritable bowel syndrome)    Irritable bowel syndrome (IBS)    Thyroid disease     Past Surgical History:  Procedure Laterality Date   NO PAST SURGERIES      No Known Allergies  Prior to Admission medications   Medication Sig Start Date End Date Taking? Authorizing Provider  fluconazole (DIFLUCAN) 150 MG tablet Take 1 tablet (150 mg total) by mouth daily. Take if developing a yeast infection after antibiotic use 06/10/22  Yes Willean Schurman, Geronimo Running, CNM  albuterol (VENTOLIN HFA) 108 (90 Base) MCG/ACT inhaler Inhale 2 puffs into the lungs every 6 (six) hours as needed for wheezing or shortness of breath. 06/10/22   Vanessa [place], MD  amoxicillin (AMOXIL) 500 MG capsule Take 2 capsules (1,000 mg total) by mouth 3 (three) times daily for 7 days. 06/10/22 06/17/22  Vanessa [place], MD  aspirin EC 81 MG tablet Take 81 mg by mouth daily.  Swallow whole.    [provider]  azithromycin (ZITHROMAX Z-PAK) 250 MG tablet Take 2 tablets (500 mg) on  Day 1,  followed by 1 tablet (250 mg) once daily on Days 2 through 5. 06/10/22 06/15/22  Vanessa [place], MD  methimazole (TAPAZOLE) 5 MG tablet Take 1 tablet (5 mg total) by mouth daily. 04/13/22   Shamleffer, Melanie Crazier, MD  Prenatal Vit-Fe Fumarate-FA (PRENATAL VITAMINS PO) Take by mouth.    [provider]      Social History: She  reports that she quit smoking about 3 years ago. Her smoking use included cigarettes. She has never used smokeless tobacco. She reports that she does not currently use alcohol. She reports that she does not use drugs.  Family History: family history includes Alcoholism in her paternal grandmother; Breast cancer in her maternal aunt and another family member; Cancer in her paternal grandfather; Dementia in her maternal grandmother; Diabetes in her paternal grandmother; Healthy in her brother; Heart attack in her maternal grandfather; Hyperlipidemia in her father; Hypertension in her mother; Hypothyroidism in her maternal aunt and maternal aunt; Thyroid disease in her maternal grandmother.  no history of gyn cancers  Review of Systems: A full review of systems was performed and negative except as noted in the HPI.     O:  BP 116/70 (BP Location: Right Arm)   Pulse 82   Temp 98.1 F (36.7 C) (Oral)   Resp 17   LMP  12/20/2021   SpO2 100%  Results for orders placed or performed during the hospital encounter of 06/10/22 (from the past 48 hour(s))  Wet prep, genital   Collection Time: 06/10/22  1:54 AM  Result Value Ref Range   Yeast Wet Prep HPF POC NONE SEEN NONE SEEN   Trich, Wet Prep NONE SEEN NONE SEEN   Clue Cells Wet Prep HPF POC NONE SEEN NONE SEEN   WBC, Wet Prep HPF POC <10 <10   Sperm NONE SEEN   Chlamydia/NGC rt PCR ([facility] only)   Collection Time: 06/10/22  1:55 AM  Result Value Ref Range   Specimen source GC/Chlam SWAB     Chlamydia Tr NOT DETECTED NOT DETECTED   N gonorrhoeae NOT DETECTED NOT DETECTED  Results for orders placed or performed during the hospital encounter of 06/09/22 (from the past 48 hour(s))  Basic metabolic panel   Collection Time: 06/09/22  6:10 PM  Result Value Ref Range   Sodium 136 135 - 145 mmol/L   Potassium 3.7 3.5 - 5.1 mmol/L   Chloride 106 98 - 111 mmol/L   CO2 22 22 - 32 mmol/L   Glucose, Bld 92 70 - 99 mg/dL   BUN 7 6 - 20 mg/dL   Creatinine, Ser 1.61 0.44 - 1.00 mg/dL   Calcium 8.5 (L) 8.9 - 10.3 mg/dL   GFR, Estimated >09 >60 mL/min   Anion gap 8 5 - 15  CBC   Collection Time: 06/09/22  6:10 PM  Result Value Ref Range   WBC 7.9 4.0 - 10.5 K/uL   RBC 4.51 3.87 - 5.11 MIL/uL   Hemoglobin 13.3 12.0 - 15.0 g/dL   HCT 45.4 09.8 - 11.9 %   MCV 85.4 80.0 - 100.0 fL   MCH 29.5 26.0 - 34.0 pg   MCHC 34.5 30.0 - 36.0 g/dL   RDW 14.7 82.9 - 56.2 %   Platelets 227 150 - 400 K/uL   nRBC 0.0 0.0 - 0.2 %  Hepatic function panel   Collection Time: 06/09/22  6:10 PM  Result Value Ref Range   Total Protein 6.6 6.5 - 8.1 g/dL   Albumin 3.1 (L) 3.5 - 5.0 g/dL   AST 15 15 - 41 U/L   ALT 14 0 - 44 U/L   Alkaline Phosphatase 71 38 - 126 U/L   Total Bilirubin 0.6 0.3 - 1.2 mg/dL   Bilirubin, Direct <1.3 0.0 - 0.2 mg/dL   Indirect Bilirubin NOT CALCULATED 0.3 - 0.9 mg/dL  Lipase, blood   Collection Time: 06/09/22  6:10 PM  Result Value Ref Range   Lipase 28 11 - 51 U/L  Troponin I (High Sensitivity)   Collection Time: 06/09/22  6:10 PM  Result Value Ref Range   Troponin I (High Sensitivity) 3 <18 ng/L  Troponin I (High Sensitivity)   Collection Time: 06/09/22  6:10 PM  Result Value Ref Range   Troponin I (High Sensitivity) 3 <18 ng/L  Resp panel by RT-PCR (RSV, Flu A&B, Covid) Anterior Nasal Swab   Collection Time: 06/09/22  6:16 PM   Specimen: Anterior Nasal Swab  Result Value Ref Range   SARS Coronavirus 2 by RT PCR NEGATIVE NEGATIVE   Influenza A by PCR NEGATIVE  NEGATIVE   Influenza B by PCR NEGATIVE NEGATIVE   Resp Syncytial Virus by PCR NEGATIVE NEGATIVE  Urinalysis, Routine w reflex microscopic   Collection Time: 06/09/22  6:20 PM  Result Value Ref Range   Color, Urine STRAW (A)  YELLOW   APPearance CLEAR (A) CLEAR   Specific Gravity, Urine 1.009 1.005 - 1.030   pH 7.0 5.0 - 8.0   Glucose, UA NEGATIVE NEGATIVE mg/dL   Hgb urine dipstick NEGATIVE NEGATIVE   Bilirubin Urine NEGATIVE NEGATIVE   Ketones, ur NEGATIVE NEGATIVE mg/dL   Protein, ur NEGATIVE NEGATIVE mg/dL   Nitrite NEGATIVE NEGATIVE   Leukocytes,Ua NEGATIVE NEGATIVE  POC urine preg, ED   Collection Time: 06/09/22  6:23 PM  Result Value Ref Range   Preg Test, Ur POSITIVE (A) NEGATIVE  ABO/Rh   Collection Time: 06/09/22  9:10 PM  Result Value Ref Range   ABO/RH(D)      O POS Performed at [facility], [address] [place], [place] [address]   Troponin I (High Sensitivity)   Collection Time: 06/09/22  9:10 PM  Result Value Ref Range   Troponin I (High Sensitivity) 3 <18 ng/L     Constitutional: NAD, AAOx3  HE/ENT: extraocular movements grossly intact, moist mucous membranes CV: RRR PULM: nl respiratory effort, CTABL     Abd: gravid, non-tender, non-distended, soft      Ext: Non-tender, Nonedematous   Psych: mood appropriate, speech normal Pelvic: swabs collected, declined speculum exam, no blood noted on swabs or in urine.  Fetal  monitoring: 145bpm, too early for NST  A/P: 27 y.o. [redacted]w[redacted]d here for antenatal surveillance for vaginal bleeding in pregnancy  Principle Diagnosis:  High risk pregnancy in second trimester, vaginal bleeding in second trimester  Vaginal bleeding: wet prep and GC/CL negative, UA WNL, only noticed one drop of blood, speculum exam deferred, no contractions or abdominal/pelvic pain or cramping or pressure. Fetal Wellbeing: Reassuring fetal doppler. Reviewed warning signs and precautions. If she experiences any more bleeding or  pressure or abdominal pain, return for evaluation. She will reach out to her doctor in the morning for evaluation and follow-up. Yeast infection prophylaxis: she gets yeast infections easily after antibiotic use. Rx for Diflucan sent to her pharmacy d/t pneumonia and antibiotic use. D/c home stable, precautions reviewed, follow-up as scheduled.    Gertie Fey, CNM 06/10/2022 4:06 AM

## 2022-06-10 NOTE — OB Triage Note (Signed)
Patient is coming into the unit today for spotting and is primarily seen at [facility] , and is G4P3 and is 24weeks. Patient denies any pain with spotting.  Pt has hx of subchorionic hemorrhage with 3rd pregnancy.

## 2022-06-10 NOTE — Progress Notes (Signed)
Name: Debra Hinton  MRN/ DOB: [id number], 01-01-1996    Age/ Sex: 27 y.o., female     PCP: Ronnell Freshwater, NP   Reason for Endocrinology Evaluation: Hyperthyroidism     Initial Endocrinology Clinic Visit:  04/10/2018    PATIENT IDENTIFIER: Debra Hinton is a 27 y.o., female with a past medical history of IBS, GAD, and Graves' disease. She has followed with [facility] since 04/10/2018 for consultative assistance with management of her Hyperthyroidism .   HISTORICAL SUMMARY:  [name], 2019 with c/o constipation alternating with diarrhea as well as anxiety and weight gain. Her Labs revealed a suppressed  TSH at < 0.006 uIU/mL and elevated FT4 and T3. A thyroid ultrasound was unrevealing.    On her initial visit to our office in November, 2020 her TSH continued to be low but with normalization of her FT4. We decided to withhold therapy due to improvement in her TFT's    By 2020 she became pregnant and was started on PTU but was lost to follow-up until her return to our office in 10/2020 when she was [redacted] weeks pregnant at the time with a suppressed TSH 0.006 uIU/mL     She was lost to follow-up by November 2022 when she was 36.6 weeks of gestation the time until her return to our clinic in October 2023 when she was in her first trimester   She is S/P vaginal delivery 04/2021  She was lost to follow-up until her return 02/2022 with fourth pregnancy at 13 weeks of gestation  She was started on methimazole during her second trimester 03/2022   FH with thyroid disease. SUBJECTIVE:     Today (06/10/2022):  [name] is here for a follow-up on Graves' disease during pregnancy. She is G4 P3  She is currently at 24.4 weeks of gestation .  She ended up in the hospital for atypical chest pain, was diagnosed with aspiration pneumonia due to GERD Denies palpitations  She denies diarrhea /loose  stools Denies tremors  Denies nausea, or vomiting   EDD 5/5th, 2024  PTU caused chemical taste   Methimazole 5 mg daily    HISTORY:  Past Medical History:  Past Medical History:  Diagnosis Date   Anxiety    Anxiety and depression    Depressive disorder    GERD (gastroesophageal reflux disease)    Gestational hypertension, third trimester    Hyperthyroidism 2019   Possible Graves disease   IBS (irritable bowel syndrome)    Irritable bowel syndrome (IBS)    Thyroid disease    Past Surgical History:  Past Surgical History:  Procedure Laterality Date   NO PAST SURGERIES     Social History:  reports that she quit smoking about 3 years ago. Her smoking use included cigarettes. She has never used smokeless tobacco. She reports that she does not currently use alcohol. She reports that she does not use drugs. Family History:  Family History  Problem Relation Age of Onset   Breast cancer Other         mggm   Hypertension Mother    Hyperlipidemia Father    Thyroid disease Maternal Grandmother    Dementia Maternal Grandmother    Heart attack Maternal Grandfather    Diabetes Paternal Grandmother    Alcoholism Paternal Grandmother    Cancer Paternal Grandfather        pancreatic   Healthy Brother  Hypothyroidism Maternal Aunt    Hypothyroidism Maternal Aunt    Breast cancer Maternal Aunt        30s     HOME MEDICATIONS: Allergies as of 06/10/2022   No Known Allergies      Medication List        Accurate as of June 10, 2022  6:58 AM. If you have any questions, ask your nurse or doctor.          albuterol 108 (90 Base) MCG/ACT inhaler Commonly known as: VENTOLIN HFA Inhale 2 puffs into the lungs every 6 (six) hours as needed for wheezing or shortness of breath.   amoxicillin 500 MG capsule Commonly known as: AMOXIL Take 2 capsules (1,000 mg total) by mouth 3 (three) times daily for 7 days.   aspirin EC 81 MG tablet Take 81 mg by mouth daily. Swallow  whole.   azithromycin 250 MG tablet Commonly known as: Zithromax Z-Pak Take 2 tablets (500 mg) on  Day 1,  followed by 1 tablet (250 mg) once daily on Days 2 through 5.   fluconazole 150 MG tablet Commonly known as: Diflucan Take 1 tablet (150 mg total) by mouth daily. Take if developing a yeast infection after antibiotic use   methimazole 5 MG tablet Commonly known as: TAPAZOLE Take 1 tablet (5 mg total) by mouth daily.   PRENATAL VITAMINS PO Take by mouth.          OBJECTIVE:   PHYSICAL EXAM: VS: BP 114/68 (BP Location: Left Arm, Patient Position: Sitting, Cuff Size: Large)   Pulse 90   Ht 5' (1.524 m)   Wt 188 lb (85.3 kg)   LMP 12/20/2021   SpO2 99%   BMI 36.72 kg/m    EXAM: General: Pt appears well and is in NAD  Neck: General: Supple without adenopathy. Thyroid: Thyroid size normal.  No goiter or nodules appreciated.   Lungs: Clear with good BS bilat with no rales, rhonchi, or wheezes  Heart: Auscultation: RRR.  Abdomen: Gravid Uterus   Extremities:  BL LE: No pretibial edema normal ROM and strength.  Mental Status: Judgment, insight: Intact Memory: Intact for recent and remote events Mood and affect: No depression, anxiety, or agitation     DATA REVIEWED:   Latest Reference Range & Units 06/10/22 12:48  TSH 0.35 - 5.50 uIU/mL 0.02 (L)  Triiodothyronine (T3) 76 - 181 ng/dL 161 (H)  Thyroxine (T4) 5.1 - 11.9 mcg/dL 09.6 (H)      Results for KAHLEA, COBERT (MRN [id number]) as of 12/22/2020 15:20  Ref. Range 04/10/2018 09:43  Thyrotropin Receptor Ab Latest Ref Range: <=16.0 % 30.4 (H)      ASSESSMENT / PLAN / RECOMMENDATIONS:   Hyperthyroidism During First Trimester:   - She is clinically euthyroid  - No local neck symptoms  -She has been on methimazole since November 2023, her TFTs are trending down, no changes are needed at this time - The goal of treatment is to maintain persistent but mild hyperthyroidism in the mother in an  attempt to prevent fetal hypothyroidism since the fetal thyroid is more sensitive to the action of thionamide therapy. Overtreatment of maternal hyperthyroidism can cause fetal goiter and primary hypothyroidism.  - PTU caused metallic taste int he past   Medication Continue methimazole 5 mg daily  Patient will have labs drawn through GYN in February and again in March, she is expected to be induced in April and I will see her ~6 weeks postop  Signed electronically by: Mack Guise, MD  [facility]  [facility] [address] [place], [place] [address] Phone: [phone] FAX: [phone]      CC: Ronnell Freshwater, NP [place] [address] Phone: [phone]  Fax: [phone]   Return to Endocrinology clinic as below: Future Appointments  Date Time Provider [place]  06/10/2022 12:10 PM Alizee Maple, Melanie Crazier, MD [facility] None  06/17/2022  8:00 AM [facility]  07/06/2022  8:30 AM [facility]  07/06/2022  9:35 AM Aletha Halim, MD [facility]  07/20/2022  9:35 AM Anyanwu, Sallyanne Havers, MD [facility]  08/03/2022  9:35 AM Aletha Halim, MD [facility]  08/17/2022  9:35 AM Anyanwu, Sallyanne Havers, MD [facility]

## 2022-06-11 MED ORDER — METHIMAZOLE 5 MG PO TABS: 5.0000 mg | ORAL_TABLET | Freq: Every day | ORAL | 1 refills | Status: DC

## 2022-06-13 LAB — TRAB (TSH RECEPTOR BINDING ANTIBODY): TRAB: 1.85 IU/L (ref <=2.00)

## 2022-06-13 LAB — T3: T3, Total: 183 ng/dL — ABNORMAL HIGH (ref 76–181)

## 2022-06-13 LAB — T4: T4, Total: 15.9 ug/dL — ABNORMAL HIGH (ref 5.1–11.9)

## 2022-06-17 ENCOUNTER — Other Ambulatory Visit: Payer: Self-pay

## 2022-06-17 ENCOUNTER — Other Ambulatory Visit: Payer: Self-pay | Admitting: Obstetrics

## 2022-06-17 ENCOUNTER — Ambulatory Visit: Payer: Medicaid Other | Attending: Obstetrics

## 2022-06-17 VITALS — BP 117/77 | HR 83 | Temp 97.9°F | Ht 60.0 in | Wt 188.5 lb

## 2022-06-17 DIAGNOSIS — E0590 Thyrotoxicosis, unspecified without thyrotoxic crisis or storm: Secondary | ICD-10-CM

## 2022-06-17 DIAGNOSIS — O09292 Supervision of pregnancy with other poor reproductive or obstetric history, second trimester: Secondary | ICD-10-CM | POA: Diagnosis not present

## 2022-06-17 DIAGNOSIS — O99212 Obesity complicating pregnancy, second trimester: Secondary | ICD-10-CM | POA: Diagnosis not present

## 2022-06-17 DIAGNOSIS — F419 Anxiety disorder, unspecified: Secondary | ICD-10-CM | POA: Diagnosis not present

## 2022-06-17 DIAGNOSIS — O99282 Endocrine, nutritional and metabolic diseases complicating pregnancy, second trimester: Secondary | ICD-10-CM

## 2022-06-17 DIAGNOSIS — Z3A25 25 weeks gestation of pregnancy: Secondary | ICD-10-CM | POA: Diagnosis not present

## 2022-06-17 DIAGNOSIS — Z362 Encounter for other antenatal screening follow-up: Secondary | ICD-10-CM

## 2022-06-17 DIAGNOSIS — O99342 Other mental disorders complicating pregnancy, second trimester: Secondary | ICD-10-CM | POA: Diagnosis not present

## 2022-06-17 DIAGNOSIS — O0992 Supervision of high risk pregnancy, unspecified, second trimester: Secondary | ICD-10-CM

## 2022-06-25 ENCOUNTER — Other Ambulatory Visit (HOSPITAL_COMMUNITY): Payer: Self-pay

## 2022-07-06 ENCOUNTER — Other Ambulatory Visit: Payer: Medicaid Other

## 2022-07-06 ENCOUNTER — Ambulatory Visit (INDEPENDENT_AMBULATORY_CARE_PROVIDER_SITE_OTHER): Payer: Medicaid Other | Admitting: Obstetrics and Gynecology

## 2022-07-06 VITALS — BP 136/82 | HR 84 | Wt 189.6 lb

## 2022-07-06 DIAGNOSIS — E0500 Thyrotoxicosis with diffuse goiter without thyrotoxic crisis or storm: Secondary | ICD-10-CM

## 2022-07-06 DIAGNOSIS — Z3A28 28 weeks gestation of pregnancy: Secondary | ICD-10-CM

## 2022-07-06 DIAGNOSIS — O99282 Endocrine, nutritional and metabolic diseases complicating pregnancy, second trimester: Secondary | ICD-10-CM

## 2022-07-06 DIAGNOSIS — Z8759 Personal history of other complications of pregnancy, childbirth and the puerperium: Secondary | ICD-10-CM

## 2022-07-06 DIAGNOSIS — O0992 Supervision of high risk pregnancy, unspecified, second trimester: Secondary | ICD-10-CM

## 2022-07-06 LAB — OB RESULTS CONSOLE RPR: RPR: NONREACTIVE

## 2022-07-06 NOTE — Addendum Note (Signed)
Addended by: Aletha Halim on: 07/06/2022 09:49 AM   Modules accepted: Orders

## 2022-07-06 NOTE — Progress Notes (Signed)
   PRENATAL VISIT NOTE  Subjective:  Debra Hinton is a 27 y.o. [phone] at [redacted]w[redacted]d being seen today for ongoing prenatal care.  She is currently monitored for the following issues for this high-risk pregnancy and has Irritable bowel syndrome with constipation; Generalized anxiety disorder; Vitamin D deficiency; Hyperthyroidism; GERD (gastroesophageal reflux disease); Graves disease; History of prior pregnancy with IUGR; Pregnancy, supervision, high-risk; Hyperthyroidism complicating pregnancy; and Vaginal discharge during pregnancy in second trimester on their problem list.  Patient reports no complaints.  Contractions: Irregular. Vag. Bleeding: None.  Movement: Present. Denies leaking of fluid.   The following portions of the patient's history were reviewed and updated as appropriate: allergies, current medications, past family history, past medical history, past social history, past surgical history and problem list.   Objective:   Vitals:   07/06/22 0922  BP: 136/82  Pulse: 84  Weight: 189 lb 9.6 oz (86 kg)    Fetal Status: Fetal Heart Rate (bpm): 139   Movement: Present     General:  Alert, oriented and cooperative. Patient is in no acute distress.  Skin: Skin is warm and dry. No rash noted.   Cardiovascular: Normal heart rate noted  Respiratory: Normal respiratory effort, no problems with respiration noted  Abdomen: Soft, gravid, appropriate for gestational age.  Pain/Pressure: Absent     Pelvic: Cervical exam deferred        Extremities: Normal range of motion.  Edema: None  Mental Status: Normal mood and affect. Normal behavior. Normal judgment and thought content.   Assessment and Plan:  Pregnancy: G4P3003 at [redacted]w[redacted]d 1. Supervision of high risk pregnancy in second trimester 28wk labs today 1/25: 59%, 881g, ac 65%, afi wnl To start qwk testing at 32wks 2. Hyperthyroidism affecting pregnancy in second trimester Stable on mmz 5 qday. Followed by endo with last visit 1/18  and recommended to repeat TFTs today and qmonth.  - CBC - RPR - HIV Antibody (routine testing w rflx) - Glucose Tolerance, 2 Hours w/1 Hour - T4, free - T4 - T3  3. [redacted] weeks gestation of pregnancy  4. Graves disease  5. History of prior pregnancy with IUGR  Preterm labor symptoms and general obstetric precautions including but not limited to vaginal bleeding, contractions, leaking of fluid and fetal movement were reviewed in detail with the patient. Please refer to After Visit Summary for other counseling recommendations.   Return in about 2 weeks (around 07/20/2022) for in person, md visit, high risk ob.  Future Appointments  Date Time Provider [place]  07/20/2022  9:35 AM Anyanwu, Sallyanne Havers, MD [facility]  07/22/2022  9:00 AM [facility]  08/03/2022  9:35 AM Aletha Halim, MD [facility]  08/05/2022  9:00 AM [facility] NST [facility] None  08/12/2022  9:00 AM [facility] NST [facility] None  08/17/2022  9:35 AM Anyanwu, Sallyanne Havers, MD [facility]  10/25/2022  3:00 PM Shamleffer, Melanie Crazier, MD [facility] None    Aletha Halim, MD

## 2022-07-06 NOTE — Progress Notes (Signed)
CC: Routine OB, denies any concerns

## 2022-07-07 ENCOUNTER — Encounter: Payer: Self-pay | Admitting: Internal Medicine

## 2022-07-07 LAB — CBC
Hematocrit: 39.9 % (ref 34.0–46.6)
Hemoglobin: 12.8 g/dL (ref 11.1–15.9)
MCH: 28.1 pg (ref 26.6–33.0)
MCHC: 32.1 g/dL (ref 31.5–35.7)
MCV: 88 fL (ref 79–97)
Platelets: 202 10*3/uL (ref 150–450)
RBC: 4.56 x10E6/uL (ref 3.77–5.28)
RDW: 12.9 % (ref 11.7–15.4)
WBC: 6.7 10*3/uL (ref 3.4–10.8)

## 2022-07-07 LAB — T4, FREE: Free T4: 1.13 ng/dL (ref 0.82–1.77)

## 2022-07-07 LAB — GLUCOSE TOLERANCE, 2 HOURS W/ 1HR
Glucose, 1 hour: 98 mg/dL (ref 70–179)
Glucose, 2 hour: 106 mg/dL (ref 70–152)
Glucose, Fasting: 77 mg/dL (ref 70–91)

## 2022-07-07 LAB — HIV ANTIBODY (ROUTINE TESTING W REFLEX): HIV Screen 4th Generation wRfx: NONREACTIVE

## 2022-07-07 LAB — T4: T4, Total: 13.3 ug/dL — ABNORMAL HIGH (ref 4.5–12.0)

## 2022-07-07 LAB — T3: T3, Total: 221 ng/dL — ABNORMAL HIGH (ref 71–180)

## 2022-07-07 LAB — TSH: TSH: 0.178 u[IU]/mL — ABNORMAL LOW (ref 0.450–4.500)

## 2022-07-07 LAB — RPR: RPR Ser Ql: NONREACTIVE

## 2022-07-20 ENCOUNTER — Ambulatory Visit (INDEPENDENT_AMBULATORY_CARE_PROVIDER_SITE_OTHER): Payer: Medicaid Other | Admitting: Obstetrics & Gynecology

## 2022-07-20 ENCOUNTER — Other Ambulatory Visit: Payer: Self-pay

## 2022-07-20 VITALS — BP 110/57 | HR 92 | Wt 190.2 lb

## 2022-07-20 DIAGNOSIS — F411 Generalized anxiety disorder: Secondary | ICD-10-CM

## 2022-07-20 DIAGNOSIS — Z8759 Personal history of other complications of pregnancy, childbirth and the puerperium: Secondary | ICD-10-CM

## 2022-07-20 DIAGNOSIS — Z23 Encounter for immunization: Secondary | ICD-10-CM | POA: Diagnosis not present

## 2022-07-20 DIAGNOSIS — O0993 Supervision of high risk pregnancy, unspecified, third trimester: Secondary | ICD-10-CM

## 2022-07-20 DIAGNOSIS — E0500 Thyrotoxicosis with diffuse goiter without thyrotoxic crisis or storm: Secondary | ICD-10-CM

## 2022-07-20 DIAGNOSIS — E0590 Thyrotoxicosis, unspecified without thyrotoxic crisis or storm: Secondary | ICD-10-CM

## 2022-07-20 DIAGNOSIS — O09293 Supervision of pregnancy with other poor reproductive or obstetric history, third trimester: Secondary | ICD-10-CM

## 2022-07-20 DIAGNOSIS — O99283 Endocrine, nutritional and metabolic diseases complicating pregnancy, third trimester: Secondary | ICD-10-CM

## 2022-07-20 DIAGNOSIS — Z3A30 30 weeks gestation of pregnancy: Secondary | ICD-10-CM

## 2022-07-20 NOTE — Progress Notes (Signed)
   PRENATAL VISIT NOTE  Subjective:  Debra Hinton is a 28 y.o. [phone] at 61w2dbeing seen today for ongoing prenatal care.  She is currently monitored for the following issues for this high-risk pregnancy and has Irritable bowel syndrome with constipation; Generalized anxiety disorder; Vitamin D deficiency; Hyperthyroidism; GERD (gastroesophageal reflux disease); Graves disease; History of prior pregnancy with IUGR; Pregnancy, supervision, high-risk; Hyperthyroidism complicating pregnancy; and Vaginal discharge during pregnancy in second trimester on their problem list.  Patient reports no complaints.  Contractions: Not present. Vag. Bleeding: None.  Movement: Present. Denies leaking of fluid.   The following portions of the patient's history were reviewed and updated as appropriate: allergies, current medications, past family history, past medical history, past social history, past surgical history and problem list.   Objective:   Vitals:   07/20/22 0940  BP: (!) 110/57  Pulse: 92  Weight: 190 lb 3.2 oz (86.3 kg)    Fetal Status: Fetal Heart Rate (bpm): 145   Movement: Present     General:  Alert, oriented and cooperative. Patient is in no acute distress.  Skin: Skin is warm and dry. No rash noted.   Cardiovascular: Normal heart rate noted  Respiratory: Normal respiratory effort, no problems with respiration noted  Abdomen: Soft, gravid, appropriate for gestational age.  Pain/Pressure: Present     Pelvic: Cervical exam deferred        Extremities: Normal range of motion.  Edema: Trace  Mental Status: Normal mood and affect. Normal behavior. Normal judgment and thought content.   Assessment and Plan:  Pregnancy: G4P3003 at 365w2d. Hyperthyroidism affecting pregnancy in third trimester Followed by Endocrinologist.  Already scheduled for weekly scans as per MFM starting at 32 weeks. Growth scan scheduled this week.   2. History of prior pregnancy with IUGR Growth scan  scheduled this week.   3. Need for Tdap vaccination - Tdap vaccine greater than or equal to 7yo IM given today  4. [redacted] weeks gestation of pregnancy 5. Supervision of high risk pregnancy in third trimester No other concerns.  Preterm labor symptoms and general obstetric precautions including but not limited to vaginal bleeding, contractions, leaking of fluid and fetal movement were reviewed in detail with the patient. Please refer to After Visit Summary for other counseling recommendations.   Return for OFFICE OB VISIT (MD only).  Future Appointments  Date Time Provider [place]2/29/2024  9:00 AM [facility]/04/2023  9:35 AM PiAletha HalimMD [facility]  08/05/2022  9:00 AM [facility] NST [facility] None  08/12/2022  9:00 AM [facility] NST [facility] None  08/17/2022  9:35 AM Angeleigh Chiasson, UgSallyanne HaversMD [facility]  08/31/2022  9:35 AM Mykah Bellomo, UgSallyanne HaversMD [facility]  09/07/2022  9:35 AM Thiago Ragsdale, UgSallyanne HaversMD [facility]  09/14/2022  9:35 AM Constant, PeVickii ChafeMD [facility]  09/21/2022  9:35 AM AnHarolyn RutherfordUgSallyanne HaversMD [facility]  10/25/2022  3:00 PM Shamleffer, IbMelanie CrazierMD [facility] None    UgVerita SchneidersMD

## 2022-07-20 NOTE — Patient Instructions (Addendum)
Return to office for any scheduled appointments. Call the office or go to the MAU at [place] [facility] if: You begin to have strong, frequent contractions Your water breaks.  Sometimes it is a big gush of fluid, sometimes it is just a trickle that keeps getting your underwear wet or running down your legs You have vaginal bleeding.  It is normal to have a small amount of spotting if your cervix was checked.  You do not feel your baby moving like normal.  If you do not, get something to eat and drink and lay down and focus on feeling your baby move.   If your baby is still not moving like normal, you should call the office or go to MAU. Any other obstetric concerns.  TDaP Vaccine Pregnancy Get the Whooping Cough Vaccine While You Are Pregnant (CDC)  It is important for women to get the whooping cough vaccine in the third trimester of each pregnancy. Vaccines are the best way to prevent this disease. There are 2 different whooping cough vaccines. Both vaccines combine protection against whooping cough, tetanus and diphtheria, but they are for different age groups: Tdap: for everyone 11 years or older, including pregnant women  DTaP: for children 2 months through 69 years of age  You need the whooping cough vaccine during each of your pregnancies The recommended time to get the shot is during your 27th through 36th week of pregnancy, preferably during the earlier part of this time period. The Centers for Disease Control and Prevention (CDC) recommends that pregnant women receive the whooping cough vaccine for adolescents and adults (called Tdap vaccine) during the third trimester of each pregnancy. The recommended time to get the shot is during your 27th through 36th week of pregnancy, preferably during the earlier part of this time period. This replaces the original recommendation that pregnant women get the vaccine only if they had not previously received it. The [employer] of [employer]st[employer]ians and Gynecologists and the [employer] support this recommendation.  You should get the whooping cough vaccine while pregnant to pass protection to your baby frame support disabled and/or not supported in this browser  Learn why Debra Hinton decided to get the whooping cough vaccine in her 3rd trimester of pregnancy and how her baby girl was born with some protection against the disease. Also available on YouTube. After receiving the whooping cough vaccine, your body will create protective antibodies (proteins produced by the body to fight off diseases) and pass some of them to your baby before birth. These antibodies provide your baby some short-term protection against whooping cough in early life. These antibodies can also protect your baby from some of the more serious complications that come along with whooping cough. Your protective antibodies are at their highest about 2 weeks after getting the vaccine, but it takes time to pass them to your baby. So the preferred time to get the whooping cough vaccine is early in your third trimester. The amount of whooping cough antibodies in your body decreases over time. That is why CDC recommends you get a whooping cough vaccine during each pregnancy. Doing so allows each of your babies to get the greatest number of protective antibodies from you. This means each of your babies will get the best protection possible against this disease.  Getting the whooping cough vaccine while pregnant is better than getting the vaccine after you give birth Whooping cough vaccination during pregnancy is ideal so your  baby will have short-term protection as soon as he is born. This early protection is important because your baby will not start getting his whooping cough vaccines until he is 2 months old. These first few months of life are when your baby is at greatest risk for catching whooping cough. This is also when he's at greatest  risk for having severe, potentially life-threating complications from the infection. To avoid that gap in protection, it is best to get a whooping cough vaccine during pregnancy. You will then pass protection to your baby before he is born. To continue protecting your baby, he should get whooping cough vaccines starting at 2 months old. You may never have gotten the Tdap vaccine before and did not get it during this pregnancy. If so, you should make sure to get the vaccine immediately after you give birth, before leaving the hospital or birthing center. It will take about 2 weeks before your body develops protection (antibodies) in response to the vaccine. Once you have protection from the vaccine, you are less likely to give whooping cough to your newborn while caring for him. But remember, your baby will still be at risk for catching whooping cough from others. A recent study looked to see how effective Tdap was at preventing whooping cough in babies whose mothers got the vaccine while pregnant or in the hospital after giving birth. The study found that getting Tdap between 27 through 36 weeks of pregnancy is 85% more effective at preventing whooping cough in babies younger than 2 months old. Blood tests cannot tell if you need a whooping cough vaccine There are no blood tests that can tell you if you have enough antibodies in your body to protect yourself or your baby against whooping cough. Even if you have been sick with whooping cough in the past or previously received the vaccine, you still should get the vaccine during each pregnancy. Breastfeeding may pass some protective antibodies onto your baby By breastfeeding, you may pass some antibodies you have made in response to the vaccine to your baby. When you get a whooping cough vaccine during your pregnancy, you will have antibodies in your breast milk that you can share with your baby as soon as your milk comes in. However, your baby will not get  protective antibodies immediately if you wait to get the whooping cough vaccine until after delivering your baby. This is because it takes about 2 weeks for your body to create antibodies. Learn more about the health benefits of breastfeeding.

## 2022-07-22 ENCOUNTER — Ambulatory Visit: Payer: Medicaid Other | Attending: Obstetrics

## 2022-07-22 ENCOUNTER — Other Ambulatory Visit: Payer: Self-pay

## 2022-07-22 VITALS — BP 114/86 | HR 79 | Temp 97.6°F | Ht 60.0 in | Wt 191.0 lb

## 2022-07-22 DIAGNOSIS — O99343 Other mental disorders complicating pregnancy, third trimester: Secondary | ICD-10-CM | POA: Insufficient documentation

## 2022-07-22 DIAGNOSIS — F411 Generalized anxiety disorder: Secondary | ICD-10-CM

## 2022-07-22 DIAGNOSIS — O09293 Supervision of pregnancy with other poor reproductive or obstetric history, third trimester: Secondary | ICD-10-CM | POA: Insufficient documentation

## 2022-07-22 DIAGNOSIS — O99283 Endocrine, nutritional and metabolic diseases complicating pregnancy, third trimester: Secondary | ICD-10-CM | POA: Insufficient documentation

## 2022-07-22 DIAGNOSIS — O99213 Obesity complicating pregnancy, third trimester: Secondary | ICD-10-CM | POA: Insufficient documentation

## 2022-07-22 DIAGNOSIS — E669 Obesity, unspecified: Secondary | ICD-10-CM

## 2022-07-22 DIAGNOSIS — E0590 Thyrotoxicosis, unspecified without thyrotoxic crisis or storm: Secondary | ICD-10-CM | POA: Diagnosis not present

## 2022-07-22 DIAGNOSIS — Z3A30 30 weeks gestation of pregnancy: Secondary | ICD-10-CM | POA: Insufficient documentation

## 2022-07-22 DIAGNOSIS — O0993 Supervision of high risk pregnancy, unspecified, third trimester: Secondary | ICD-10-CM | POA: Diagnosis not present

## 2022-07-22 DIAGNOSIS — E0500 Thyrotoxicosis with diffuse goiter without thyrotoxic crisis or storm: Secondary | ICD-10-CM | POA: Diagnosis not present

## 2022-07-22 DIAGNOSIS — F419 Anxiety disorder, unspecified: Secondary | ICD-10-CM | POA: Diagnosis not present

## 2022-08-03 ENCOUNTER — Ambulatory Visit (INDEPENDENT_AMBULATORY_CARE_PROVIDER_SITE_OTHER): Payer: Medicaid Other | Admitting: Obstetrics and Gynecology

## 2022-08-03 VITALS — BP 125/84 | HR 86 | Wt 192.6 lb

## 2022-08-03 DIAGNOSIS — O99283 Endocrine, nutritional and metabolic diseases complicating pregnancy, third trimester: Secondary | ICD-10-CM

## 2022-08-03 DIAGNOSIS — Z3A32 32 weeks gestation of pregnancy: Secondary | ICD-10-CM

## 2022-08-03 DIAGNOSIS — Z8759 Personal history of other complications of pregnancy, childbirth and the puerperium: Secondary | ICD-10-CM

## 2022-08-03 NOTE — Progress Notes (Signed)
CC: routine ob, denies any concerns

## 2022-08-03 NOTE — Progress Notes (Signed)
   PRENATAL VISIT NOTE  Subjective:  Debra Hinton is a 27 y.o. [phone] at [redacted]w[redacted]d being seen today for ongoing prenatal care.  She is currently monitored for the following issues for this high-risk pregnancy and has Irritable bowel syndrome with constipation; Generalized anxiety disorder; Vitamin D deficiency; Hyperthyroidism; GERD (gastroesophageal reflux disease); Graves disease; History of prior pregnancy with IUGR; Pregnancy, supervision, high-risk; Hyperthyroidism complicating pregnancy; and Vaginal discharge during pregnancy in second trimester on their problem list.  Patient reports no complaints.  Contractions: Not present. Vag. Bleeding: None.  Movement: Present. Denies leaking of fluid.   The following portions of the patient's history were reviewed and updated as appropriate: allergies, current medications, past family history, past medical history, past social history, past surgical history and problem list.   Objective:   Vitals:   08/03/22 0937  BP: 125/84  Pulse: 86  Weight: 192 lb 9.6 oz (87.4 kg)    Fetal Status: Fetal Heart Rate (bpm): 153   Movement: Present     General:  Alert, oriented and cooperative. Patient is in no acute distress.  Skin: Skin is warm and dry. No rash noted.   Cardiovascular: Normal heart rate noted  Respiratory: Normal respiratory effort, no problems with respiration noted  Abdomen: Soft, gravid, appropriate for gestational age.  Pain/Pressure: Absent     Pelvic: Cervical exam deferred        Extremities: Normal range of motion.  Edema: Trace  Mental Status: Normal mood and affect. Normal behavior. Normal judgment and thought content.   Assessment and Plan:  Pregnancy: [id number] at [redacted]w[redacted]d 1. [redacted] weeks gestation of pregnancy D/w pt re: birth control  - T4, free - TSH - T3 - T4  2. Hyperthyroidism affecting pregnancy in third trimester On mmz 5 qday. Surveillance labs today. To start qwk testing tomorrow with bpp per MFM. Will touch  base with them to make sure.  2/29: 54%, 1689g, ac 71%, afi 16 - T4, free - TSH - T3 - T4  3. History of prior pregnancy with IUGR See above  Preterm labor symptoms and general obstetric precautions including but not limited to vaginal bleeding, contractions, leaking of fluid and fetal movement were reviewed in detail with the patient. Please refer to After Visit Summary for other counseling recommendations.   Return in about 1 day (around 08/04/2022) for bpp with Gerald Stabs tomorrow.  Future Appointments  Date Time Provider [place]  08/04/2022  8:00 AM [facility] NST [facility] None  08/11/2022  8:30 AM [facility] NST [facility] None  08/17/2022  9:35 AM Anyanwu, Sallyanne Havers, MD [facility]  08/23/2022  8:30 AM [facility] US1 [facility]  08/30/2022  8:00 AM [facility] US1 [facility]  08/31/2022  9:35 AM Anyanwu, Sallyanne Havers, MD [facility]  09/07/2022  9:35 AM Anyanwu, Sallyanne Havers, MD [facility]  09/14/2022  9:35 AM Constant, Vickii Chafe, MD [facility]  09/21/2022  9:35 AM Anyanwu, Sallyanne Havers, MD [facility]  10/25/2022  3:00 PM Shamleffer, Melanie Crazier, MD [facility] None    Aletha Halim, MD

## 2022-08-04 ENCOUNTER — Other Ambulatory Visit: Payer: Self-pay

## 2022-08-04 ENCOUNTER — Encounter: Payer: Self-pay | Admitting: Maternal & Fetal Medicine

## 2022-08-04 ENCOUNTER — Ambulatory Visit: Payer: Medicaid Other | Attending: Maternal & Fetal Medicine | Admitting: Maternal & Fetal Medicine

## 2022-08-04 DIAGNOSIS — O0993 Supervision of high risk pregnancy, unspecified, third trimester: Secondary | ICD-10-CM

## 2022-08-04 LAB — TSH: TSH: 0.156 u[IU]/mL — ABNORMAL LOW (ref 0.450–4.500)

## 2022-08-04 LAB — T4: T4, Total: 16.5 ug/dL — ABNORMAL HIGH (ref 4.5–12.0)

## 2022-08-04 LAB — T4, FREE: Free T4: 1.29 ng/dL (ref 0.82–1.77)

## 2022-08-04 LAB — T3: T3, Total: 222 ng/dL — ABNORMAL HIGH (ref 71–180)

## 2022-08-04 NOTE — Procedures (Addendum)
Debra Hinton Aug 04, 1995 [redacted]w[redacted]d Fetus A Non-Stress Test Interpretation for 08/04/22  Indication:  Hyperthyroidism, Obesity, Hx:FGR  Fetal Heart Rate A Mode: External Baseline Rate (A): 140 bpm Variability: Moderate Accelerations: 15 x 15 Decelerations: None Scalp Stimulation: Negative Multiple birth?: No  Uterine Activity Mode: Toco  Interpretation (Fetal Testing) Nonstress Test Interpretation: Reactive (Dr. SEpimenio Sarininterpreted the NST)

## 2022-08-05 ENCOUNTER — Other Ambulatory Visit: Payer: Medicaid Other

## 2022-08-09 ENCOUNTER — Other Ambulatory Visit: Payer: Self-pay

## 2022-08-09 DIAGNOSIS — O99213 Obesity complicating pregnancy, third trimester: Secondary | ICD-10-CM

## 2022-08-09 DIAGNOSIS — E0500 Thyrotoxicosis with diffuse goiter without thyrotoxic crisis or storm: Secondary | ICD-10-CM

## 2022-08-09 DIAGNOSIS — O0993 Supervision of high risk pregnancy, unspecified, third trimester: Secondary | ICD-10-CM

## 2022-08-09 DIAGNOSIS — E0590 Thyrotoxicosis, unspecified without thyrotoxic crisis or storm: Secondary | ICD-10-CM

## 2022-08-11 ENCOUNTER — Other Ambulatory Visit: Payer: Medicaid Other

## 2022-08-11 ENCOUNTER — Other Ambulatory Visit: Payer: Self-pay

## 2022-08-11 ENCOUNTER — Ambulatory Visit: Payer: Medicaid Other | Attending: Maternal & Fetal Medicine

## 2022-08-11 DIAGNOSIS — E0500 Thyrotoxicosis with diffuse goiter without thyrotoxic crisis or storm: Secondary | ICD-10-CM | POA: Insufficient documentation

## 2022-08-11 DIAGNOSIS — O09293 Supervision of pregnancy with other poor reproductive or obstetric history, third trimester: Secondary | ICD-10-CM | POA: Insufficient documentation

## 2022-08-11 DIAGNOSIS — E669 Obesity, unspecified: Secondary | ICD-10-CM

## 2022-08-11 DIAGNOSIS — O99283 Endocrine, nutritional and metabolic diseases complicating pregnancy, third trimester: Secondary | ICD-10-CM | POA: Diagnosis present

## 2022-08-11 DIAGNOSIS — Z3A33 33 weeks gestation of pregnancy: Secondary | ICD-10-CM | POA: Insufficient documentation

## 2022-08-11 DIAGNOSIS — O0993 Supervision of high risk pregnancy, unspecified, third trimester: Secondary | ICD-10-CM

## 2022-08-11 DIAGNOSIS — E0590 Thyrotoxicosis, unspecified without thyrotoxic crisis or storm: Secondary | ICD-10-CM

## 2022-08-11 DIAGNOSIS — O99343 Other mental disorders complicating pregnancy, third trimester: Secondary | ICD-10-CM

## 2022-08-11 DIAGNOSIS — F419 Anxiety disorder, unspecified: Secondary | ICD-10-CM

## 2022-08-11 DIAGNOSIS — O99213 Obesity complicating pregnancy, third trimester: Secondary | ICD-10-CM | POA: Diagnosis not present

## 2022-08-12 ENCOUNTER — Other Ambulatory Visit: Payer: Medicaid Other

## 2022-08-14 NOTE — Progress Notes (Signed)
Opened in error

## 2022-08-16 ENCOUNTER — Other Ambulatory Visit: Payer: Self-pay

## 2022-08-16 DIAGNOSIS — O99213 Obesity complicating pregnancy, third trimester: Secondary | ICD-10-CM

## 2022-08-16 DIAGNOSIS — E0500 Thyrotoxicosis with diffuse goiter without thyrotoxic crisis or storm: Secondary | ICD-10-CM

## 2022-08-16 DIAGNOSIS — O0993 Supervision of high risk pregnancy, unspecified, third trimester: Secondary | ICD-10-CM

## 2022-08-16 DIAGNOSIS — F411 Generalized anxiety disorder: Secondary | ICD-10-CM

## 2022-08-16 DIAGNOSIS — E0590 Thyrotoxicosis, unspecified without thyrotoxic crisis or storm: Secondary | ICD-10-CM

## 2022-08-17 ENCOUNTER — Encounter: Payer: Self-pay | Admitting: Obstetrics & Gynecology

## 2022-08-17 ENCOUNTER — Ambulatory Visit (INDEPENDENT_AMBULATORY_CARE_PROVIDER_SITE_OTHER): Payer: Medicaid Other | Admitting: Obstetrics & Gynecology

## 2022-08-17 VITALS — BP 111/80 | HR 96 | Wt 190.0 lb

## 2022-08-17 DIAGNOSIS — O99213 Obesity complicating pregnancy, third trimester: Secondary | ICD-10-CM

## 2022-08-17 DIAGNOSIS — E0590 Thyrotoxicosis, unspecified without thyrotoxic crisis or storm: Secondary | ICD-10-CM

## 2022-08-17 DIAGNOSIS — O99283 Endocrine, nutritional and metabolic diseases complicating pregnancy, third trimester: Secondary | ICD-10-CM

## 2022-08-17 DIAGNOSIS — O0993 Supervision of high risk pregnancy, unspecified, third trimester: Secondary | ICD-10-CM

## 2022-08-17 DIAGNOSIS — O99210 Obesity complicating pregnancy, unspecified trimester: Secondary | ICD-10-CM | POA: Insufficient documentation

## 2022-08-17 DIAGNOSIS — E0500 Thyrotoxicosis with diffuse goiter without thyrotoxic crisis or storm: Secondary | ICD-10-CM

## 2022-08-17 DIAGNOSIS — Z3A34 34 weeks gestation of pregnancy: Secondary | ICD-10-CM

## 2022-08-17 NOTE — Progress Notes (Signed)
PRENATAL VISIT NOTE  Subjective:  Debra Hinton is a 27 y.o. [phone] at [redacted]w[redacted]d being seen today for ongoing prenatal care.  She is currently monitored for the following issues for this high-risk pregnancy and has Irritable bowel syndrome with constipation; Generalized anxiety disorder; Vitamin D deficiency; Hyperthyroidism; GERD (gastroesophageal reflux disease); Graves disease; History of prior pregnancy with IUGR; Supervision of high risk pregnancy in third trimester; Hyperthyroidism complicating pregnancy; and Obesity in pregnancy, antepartum on their problem list.  Patient reports no complaints.  Contractions: Irregular. Vag. Bleeding: None.  Movement: Present. Denies leaking of fluid.   The following portions of the patient's history were reviewed and updated as appropriate: allergies, current medications, past family history, past medical history, past social history, past surgical history and problem list.   Objective:   Vitals:   08/17/22 1002  BP: 111/80  Pulse: 96  Weight: 190 lb (86.2 kg)    Fetal Status: Fetal Heart Rate (bpm): 141   Movement: Present     General:  Alert, oriented and cooperative. Patient is in no acute distress.  Skin: Skin is warm and dry. No rash noted.   Cardiovascular: Normal heart rate noted  Respiratory: Normal respiratory effort, no problems with respiration noted  Abdomen: Soft, gravid, appropriate for gestational age.  Pain/Pressure: Present     Pelvic: Cervical exam deferred        Extremities: Normal range of motion.  Edema: None  Mental Status: Normal mood and affect. Normal behavior. Normal judgment and thought content.   Imaging: [place] MFM FETAL BPP WO NON STRESS  Result Date: 08/11/2022 ----------------------------------------------------------------------  OBSTETRICS REPORT                       (Signed Final 08/11/2022 10:15 am) ---------------------------------------------------------------------- Patient Info  ID #:        [id number]                          D.O.B.:  28-Feb-1996 (26 yrs)  Name:       Debra Hinton            Visit Date: 08/11/2022 08:57 am ---------------------------------------------------------------------- Performed By  Attending:        Sander Nephew      Secondary Phy.:   Center                    MD  Performed By:     Rolm Bookbinder RDMS     Address:          [place]  Referred By:      Juanita Craver         Location[facility]         [facility]                                [facility]  Ref. Address:     [address]                    [place], [place] ---------------------------------------------------------------------- Orders  #  Description                           Code        Ordered By  1  [place] MFM FETAL BPP WO NON               76819.01    [place] ----------------------------------------------------------------------  #  Order #                     Accession #                Episode #  1  [id number]                   [id number]                 [id number] ---------------------------------------------------------------------- Indications  [redacted] weeks gestation of pregnancy                Z3A.33  Hyperthyroid                                   O99.280 0000000  Obesity complicating pregnancy, third          O99.213  trimester  Anxiety during pregnancy, third trimester      O99.343, F41.9  Poor obstetric history: Previous gestational   O09.299  HTN  AFP neg/LR NIPS  Poor obstetric history: Previous fetal growth  O09.299  restriction (FGR)  Encounter for other antenatal screening        Z36.2  follow-up  [redacted] weeks gestation of pregnancy                Z3A.30 ---------------------------------------------------------------------- Vital  Signs                                                 Height:        5'0" ---------------------------------------------------------------------- Fetal Evaluation  Num Of Fetuses:         1  Fetal Heart Rate(bpm):  142  Cardiac Activity:       Observed  Presentation:           Cephalic  Placenta:               Posterior  P. Cord Insertion:      Previously visualized  Amniotic Fluid  AFI FV:      Within normal limits  AFI Sum(cm)     %Tile       Largest Pocket(cm)  13.97           48  5.53  RUQ(cm)       RLQ(cm)       LUQ(cm)        LLQ(cm)  4.39          5.53          1.85           2.2 ---------------------------------------------------------------------- Biophysical Evaluation  Amniotic F.V:   Within normal limits       F. Tone:        Observed  F. Movement:    Observed                   Score:          8/8  F. Breathing:   Observed ---------------------------------------------------------------------- OB History  Gravidity:    4         Term:   3  Living:       3 ---------------------------------------------------------------------- Gestational Age  LMP:           33w 3d        Date:  12/20/21                  EDD:   09/26/22  Best:          33w 3d     Det. By:  LMP  (12/20/21)          EDD:   09/26/22 ---------------------------------------------------------------------- Anatomy  Cranium:               Appears normal         Kidneys:                Appear normal  Heart:                 Appears normal         Bladder:                Appears normal                         (4CH, axis, and                         situs)  Stomach:               Appears normal, left                         sided  Other:  All anatomy was previously seen ---------------------------------------------------------------------- Impression  Antenatal testing performed given maternal hyperthyroidism  The biophysical profile was 8/8 with good fetal movement and  amniotic fluid volume.  Recent labs drawn on 3/12 showing sitll elevated FT 4 &  3->  with TSH 0.156. She is taking 5 mg Methimazole daily.  She saw her endocrinologist 1 week ago and she reports that  they will continue on her current therapy. ---------------------------------------------------------------------- Recommendations  Continue weekly testing and serial growth as previously  scheduled. ----------------------------------------------------------------------              Sander Nephew, MD Electronically Signed Final Report   08/11/2022 10:15 am ----------------------------------------------------------------------  [place] MFM OB FOLLOW UP  Result Date: 07/22/2022 ----------------------------------------------------------------------  OBSTETRICS REPORT                       (Signed Final 07/22/2022 09:54 am) ---------------------------------------------------------------------- Patient Info  ID #:       [id number]  D.O.B.:  1996-01-01 (26 yrs)  Name:       SHAMA STOPHEL Ganoe            Visit Date: 07/22/2022 08:28 am ---------------------------------------------------------------------- Performed By  Attending:        Johnell Comings MD         Secondary Phy.:   [place]  Performed By:     Rodrigo Ran BS      Address:          [address]                                                             [address]  Referred By:      Juanita Craver         Location:         [facility]                                [facility] at                                                             [facility]  Ref. Address:     [address]                    [place], [place] ---------------------------------------------------------------------- Orders  #  Description                           Code        Ordered By  1  [place] MFM OB FOLLOW UP                   [id number]    Peters[place] Ao ----------------------------------------------------------------------  #  Order #                      Accession #                Episode #  1  [id number]                   [id number]                 [id number] ---------------------------------------------------------------------- Indications  Hyperthyroid                                   O99.280 0000000  Obesity complicating pregnancy, third          O99.213  trimester  Anxiety during pregnancy, third trimester      O99.343, F41.9  Poor obstetric history: Previous gestational   O09.299  HTN  AFP neg/LR NIPS  Poor obstetric history: Previous fetal growth  O09.299  restriction (FGR)  Encounter for other antenatal screening        Z36.2  follow-up  [redacted] weeks gestation of pregnancy                Z3A.30 ---------------------------------------------------------------------- Vital Signs                                                 Height:        5'0" ---------------------------------------------------------------------- Fetal Evaluation  Num Of Fetuses:         1  Fetal Heart Rate(bpm):  157  Cardiac Activity:       Observed  Presentation:           Breech  Placenta:               Posterior  P. Cord Insertion:      Previously visualized  Amniotic Fluid  AFI FV:      Within normal limits  AFI Sum(cm)     %Tile       Largest Pocket(cm)  16.3            59          4.21  RUQ(cm)       RLQ(cm)       LUQ(cm)        LLQ(cm)  4.15          3.81          4.13           4.21 ---------------------------------------------------------------------- Biometry  BPD:     78.02  mm     G. Age:  31w 2d         62  %    CI:        73.05   %    70 - 86                                                          FL/HC:      19.8   %    19.3 - 21.3  HC:    290.17   mm     G. Age:  32w 0d         53  %    HC/AC:      1.06        0.96 - 1.17  AC:    273.41   mm     G. Age:  31w 3d         71  %    FL/BPD:     73.5   %    71 - 87  FL:      57.38  mm     G. Age:  30w 0d         22  %    FL/AC:      21.0   %    20 - 24  LV:          5  mm  Est. FW:    1689  gm    3 lb 12 oz      54  %  ---------------------------------------------------------------------- OB History  Gravidity:    4         Term:   3  Living:       3 ---------------------------------------------------------------------- Gestational Age  LMP:           30w 4d        Date:  12/20/21                  EDD:   09/26/22  U/S Today:     31w 1d                                        EDD:   09/22/22  Best:          30w 4d     Det. By:  LMP  (12/20/21)          EDD:   09/26/22 ---------------------------------------------------------------------- Anatomy  Cranium:               Previously seen        Aortic Arch:            Previously seen  Cavum:                 Appears normal         Ductal Arch:            Previously seen  Ventricles:            Appears normal         Diaphragm:              Appears normal  Choroid Plexus:        Previously seen        Stomach:                Appears normal, left                                                                        sided  Cerebellum:            Previously seen        Abdomen:                Appears normal  Posterior Fossa:       Previously seen        Abdominal Wall:         Previously seen  Nuchal Fold:           Previously seen        Cord Vessels:           Previously seen  Face:                  Orbits and profile     Kidneys:                Appear normal                         previously seen  Lips:  Previously seen        Bladder:                Appears normal  Thoracic:              Appears normal         Spine:                  Previously seen  Heart:                 Appears normal         Upper Extremities:      Previously seen                         (4CH, axis, and                         situs)  RVOT:                  Previously seen        Lower Extremities:      Previously seen  LVOT:                  Previously seen  Other:  Female gender previously seen. Heels/feet and open hands/5th digits          prev visualized. Nasal bone, lenses, maxilla, mandible and falx  prev          visualized VC, 3VV and 3VTV prev visualized. ---------------------------------------------------------------------- Cervix Uterus Adnexa  Cervix  Not visualized (advanced GA >24wks)  Uterus  No abnormality visualized.  Right Ovary  Not visualized.  Left Ovary  Not visualized.  Cul De Sac  No free fluid seen.  Adnexa  No abnormality visualized ---------------------------------------------------------------------- Comments  This patient was seen for a follow up growth scan due to  maternal obesity and hyperthyroidism treated with  methimazole 5 mg daily.  Her endocrinologist has stated that  the goal is to maintain persistent mild hyperthyroidism.  Her  most recent T3 and T4 levels were mildly elevated and her  TSH level was low.  She denies any problems since her last  exam.  She was informed that the fetal growth and amniotic fluid  level appears appropriate for her gestational age.  Due to hyperthyroidism, we will start weekly fetal testing at 32  weeks.  She will return in 2 weeks for a BPP. ----------------------------------------------------------------------                   Johnell Comings, MD Electronically Signed Final Report   07/22/2022 09:54 am ----------------------------------------------------------------------   Assessment and Plan:  Pregnancy: [id number] at [redacted]w[redacted]d 1. Hyperthyroidism affecting pregnancy in third trimester 2. Graves disease Continue Methimazole and Endocrinology's recommendations. Continue weekly antenatal testing and scans as per MFM, follow up their delivery recommendations.  3. Obesity in pregnancy, antepartum TWG 25 lbs, on aspirin.  4. [redacted] weeks gestation of pregnancy 5. Supervision of high risk pregnancy in third trimester No other concerns. Preterm labor symptoms and general obstetric precautions including but not limited to vaginal bleeding, contractions, leaking of fluid and fetal movement were reviewed in detail with the patient. Please refer to After  Visit Summary for other counseling recommendations.   Return in about 2 weeks (around 08/31/2022) for Pelvic cultures, OFFICE OB VISIT (MD only).  Future Appointments  Date Time Provider [place]  08/18/2022 10:00 AM [facility]  [place]  08/23/2022  8:30 AM [facility]  08/30/2022  8:00 AM [facility]  08/31/2022  9:35 AM [place] Bowlds, Sallyanne Havers, MD [facility]  09/07/2022  9:35 AM Dontre Laduca, Sallyanne Havers, MD [facility]  09/14/2022  9:35 AM Constant, Vickii Chafe, MD [facility]  09/21/2022  9:35 AM Zared Knoth, Sallyanne Havers, MD [facility]  10/25/2022  3:00 PM Shamleffer, Melanie Crazier, MD [facility] None    Verita Schneiders, MD

## 2022-08-17 NOTE — Patient Instructions (Signed)
Return to office for any scheduled appointments. Call the office or go to the MAU at [facility] if: You begin to have strong, frequent contractions Your water breaks.  Sometimes it is a big gush of fluid, sometimes it is just a trickle that keeps getting your underwear wet or running down your legs You have vaginal bleeding.  It is normal to have a small amount of spotting if your cervix was checked.  You do not feel your baby moving like normal.  If you do not, get something to eat and drink and lay down and focus on feeling your baby move.   If your baby is still not moving like normal, you should call the office or go to MAU. Any other obstetric concerns.  

## 2022-08-18 ENCOUNTER — Other Ambulatory Visit: Payer: Self-pay | Admitting: Obstetrics and Gynecology

## 2022-08-18 ENCOUNTER — Other Ambulatory Visit: Payer: Self-pay

## 2022-08-18 ENCOUNTER — Ambulatory Visit: Payer: Medicaid Other | Attending: Obstetrics and Gynecology

## 2022-08-18 DIAGNOSIS — E0500 Thyrotoxicosis with diffuse goiter without thyrotoxic crisis or storm: Secondary | ICD-10-CM

## 2022-08-18 DIAGNOSIS — O99213 Obesity complicating pregnancy, third trimester: Secondary | ICD-10-CM | POA: Diagnosis not present

## 2022-08-18 DIAGNOSIS — E0590 Thyrotoxicosis, unspecified without thyrotoxic crisis or storm: Secondary | ICD-10-CM

## 2022-08-18 DIAGNOSIS — O0993 Supervision of high risk pregnancy, unspecified, third trimester: Secondary | ICD-10-CM

## 2022-08-18 DIAGNOSIS — O10913 Unspecified pre-existing hypertension complicating pregnancy, third trimester: Secondary | ICD-10-CM | POA: Insufficient documentation

## 2022-08-18 DIAGNOSIS — F32A Depression, unspecified: Secondary | ICD-10-CM

## 2022-08-18 DIAGNOSIS — F419 Anxiety disorder, unspecified: Secondary | ICD-10-CM | POA: Insufficient documentation

## 2022-08-18 DIAGNOSIS — O09293 Supervision of pregnancy with other poor reproductive or obstetric history, third trimester: Secondary | ICD-10-CM | POA: Insufficient documentation

## 2022-08-18 DIAGNOSIS — F411 Generalized anxiety disorder: Secondary | ICD-10-CM

## 2022-08-18 DIAGNOSIS — O99343 Other mental disorders complicating pregnancy, third trimester: Secondary | ICD-10-CM | POA: Diagnosis not present

## 2022-08-18 DIAGNOSIS — E669 Obesity, unspecified: Secondary | ICD-10-CM

## 2022-08-18 DIAGNOSIS — O99283 Endocrine, nutritional and metabolic diseases complicating pregnancy, third trimester: Secondary | ICD-10-CM

## 2022-08-18 DIAGNOSIS — Z3A34 34 weeks gestation of pregnancy: Secondary | ICD-10-CM

## 2022-08-23 ENCOUNTER — Ambulatory Visit: Payer: Medicaid Other | Attending: Obstetrics and Gynecology

## 2022-08-23 ENCOUNTER — Other Ambulatory Visit: Payer: Medicaid Other

## 2022-08-23 ENCOUNTER — Other Ambulatory Visit: Payer: Self-pay

## 2022-08-23 VITALS — BP 105/74 | HR 92 | Temp 97.8°F | Ht 60.0 in | Wt 193.0 lb

## 2022-08-23 DIAGNOSIS — E0590 Thyrotoxicosis, unspecified without thyrotoxic crisis or storm: Secondary | ICD-10-CM | POA: Diagnosis not present

## 2022-08-23 DIAGNOSIS — O0993 Supervision of high risk pregnancy, unspecified, third trimester: Secondary | ICD-10-CM | POA: Insufficient documentation

## 2022-08-23 DIAGNOSIS — E669 Obesity, unspecified: Secondary | ICD-10-CM

## 2022-08-23 DIAGNOSIS — E0500 Thyrotoxicosis with diffuse goiter without thyrotoxic crisis or storm: Secondary | ICD-10-CM

## 2022-08-23 DIAGNOSIS — Z3A35 35 weeks gestation of pregnancy: Secondary | ICD-10-CM | POA: Insufficient documentation

## 2022-08-23 DIAGNOSIS — F419 Anxiety disorder, unspecified: Secondary | ICD-10-CM | POA: Diagnosis not present

## 2022-08-23 DIAGNOSIS — O99283 Endocrine, nutritional and metabolic diseases complicating pregnancy, third trimester: Secondary | ICD-10-CM | POA: Insufficient documentation

## 2022-08-23 DIAGNOSIS — O99343 Other mental disorders complicating pregnancy, third trimester: Secondary | ICD-10-CM | POA: Insufficient documentation

## 2022-08-23 DIAGNOSIS — F32A Anxiety disorder, unspecified: Secondary | ICD-10-CM

## 2022-08-23 DIAGNOSIS — O09293 Supervision of pregnancy with other poor reproductive or obstetric history, third trimester: Secondary | ICD-10-CM

## 2022-08-23 DIAGNOSIS — O99213 Obesity complicating pregnancy, third trimester: Secondary | ICD-10-CM | POA: Insufficient documentation

## 2022-08-25 ENCOUNTER — Other Ambulatory Visit: Payer: Self-pay

## 2022-08-25 DIAGNOSIS — O99213 Obesity complicating pregnancy, third trimester: Secondary | ICD-10-CM

## 2022-08-25 DIAGNOSIS — E0590 Thyrotoxicosis, unspecified without thyrotoxic crisis or storm: Secondary | ICD-10-CM

## 2022-08-25 DIAGNOSIS — O0993 Supervision of high risk pregnancy, unspecified, third trimester: Secondary | ICD-10-CM

## 2022-08-25 DIAGNOSIS — Z8759 Personal history of other complications of pregnancy, childbirth and the puerperium: Secondary | ICD-10-CM

## 2022-08-25 DIAGNOSIS — E0500 Thyrotoxicosis with diffuse goiter without thyrotoxic crisis or storm: Secondary | ICD-10-CM

## 2022-08-25 DIAGNOSIS — F32A Depression, unspecified: Secondary | ICD-10-CM

## 2022-08-30 ENCOUNTER — Ambulatory Visit: Payer: Medicaid Other | Attending: Obstetrics

## 2022-08-30 ENCOUNTER — Other Ambulatory Visit: Payer: Self-pay

## 2022-08-30 DIAGNOSIS — F419 Anxiety disorder, unspecified: Secondary | ICD-10-CM | POA: Diagnosis not present

## 2022-08-30 DIAGNOSIS — Z3A36 36 weeks gestation of pregnancy: Secondary | ICD-10-CM | POA: Insufficient documentation

## 2022-08-30 DIAGNOSIS — Z8759 Personal history of other complications of pregnancy, childbirth and the puerperium: Secondary | ICD-10-CM

## 2022-08-30 DIAGNOSIS — E669 Obesity, unspecified: Secondary | ICD-10-CM | POA: Diagnosis not present

## 2022-08-30 DIAGNOSIS — E0590 Thyrotoxicosis, unspecified without thyrotoxic crisis or storm: Secondary | ICD-10-CM | POA: Diagnosis not present

## 2022-08-30 DIAGNOSIS — O09293 Supervision of pregnancy with other poor reproductive or obstetric history, third trimester: Secondary | ICD-10-CM | POA: Diagnosis not present

## 2022-08-30 DIAGNOSIS — O99343 Other mental disorders complicating pregnancy, third trimester: Secondary | ICD-10-CM | POA: Insufficient documentation

## 2022-08-30 DIAGNOSIS — E0500 Thyrotoxicosis with diffuse goiter without thyrotoxic crisis or storm: Secondary | ICD-10-CM

## 2022-08-30 DIAGNOSIS — O0993 Supervision of high risk pregnancy, unspecified, third trimester: Secondary | ICD-10-CM

## 2022-08-30 DIAGNOSIS — O99213 Obesity complicating pregnancy, third trimester: Secondary | ICD-10-CM | POA: Insufficient documentation

## 2022-08-30 DIAGNOSIS — O99283 Endocrine, nutritional and metabolic diseases complicating pregnancy, third trimester: Secondary | ICD-10-CM | POA: Insufficient documentation

## 2022-08-31 ENCOUNTER — Ambulatory Visit (INDEPENDENT_AMBULATORY_CARE_PROVIDER_SITE_OTHER): Payer: Medicaid Other | Admitting: Obstetrics & Gynecology

## 2022-08-31 ENCOUNTER — Other Ambulatory Visit (HOSPITAL_COMMUNITY)
Admission: RE | Admit: 2022-08-31 | Discharge: 2022-08-31 | Disposition: A | Discharge status: Home / Self Care | Payer: Medicaid Other | Source: Ambulatory Visit | Attending: Obstetrics & Gynecology | Admitting: Obstetrics & Gynecology

## 2022-08-31 VITALS — BP 137/87 | HR 101 | Wt 189.0 lb

## 2022-08-31 DIAGNOSIS — Z8759 Personal history of other complications of pregnancy, childbirth and the puerperium: Secondary | ICD-10-CM

## 2022-08-31 DIAGNOSIS — Z3A36 36 weeks gestation of pregnancy: Secondary | ICD-10-CM | POA: Diagnosis not present

## 2022-08-31 DIAGNOSIS — O0993 Supervision of high risk pregnancy, unspecified, third trimester: Secondary | ICD-10-CM | POA: Insufficient documentation

## 2022-08-31 DIAGNOSIS — O169 Unspecified maternal hypertension, unspecified trimester: Secondary | ICD-10-CM

## 2022-08-31 DIAGNOSIS — O99210 Obesity complicating pregnancy, unspecified trimester: Secondary | ICD-10-CM

## 2022-08-31 DIAGNOSIS — O163 Unspecified maternal hypertension, third trimester: Secondary | ICD-10-CM

## 2022-08-31 DIAGNOSIS — E0500 Thyrotoxicosis with diffuse goiter without thyrotoxic crisis or storm: Secondary | ICD-10-CM

## 2022-08-31 DIAGNOSIS — O99283 Endocrine, nutritional and metabolic diseases complicating pregnancy, third trimester: Secondary | ICD-10-CM

## 2022-08-31 DIAGNOSIS — E0590 Thyrotoxicosis, unspecified without thyrotoxic crisis or storm: Secondary | ICD-10-CM

## 2022-08-31 DIAGNOSIS — O99213 Obesity complicating pregnancy, third trimester: Secondary | ICD-10-CM

## 2022-08-31 NOTE — Progress Notes (Signed)
PRENATAL VISIT NOTE  Subjective:  Debra Hinton is a 27 y.o. [phone] at [redacted]w[redacted]d being seen today for ongoing prenatal care.  She is currently monitored for the following issues for this high-risk pregnancy and has Irritable bowel syndrome with constipation; Generalized anxiety disorder; Vitamin D deficiency; Hyperthyroidism; GERD (gastroesophageal reflux disease); Graves disease; History of prior pregnancy with IUGR; Supervision of high risk pregnancy in third trimester; Hyperthyroidism complicating pregnancy; and Obesity in pregnancy, antepartum on their problem list.  Patient reports no complaints. Patient denies any headaches, visual symptoms, RUQ/epigastric pain or other concerning symptoms.  Contractions: Irregular. Vag. Bleeding: None.  Movement: Present. Denies leaking of fluid.   The following portions of the patient's history were reviewed and updated as appropriate: allergies, current medications, past family history, past medical history, past social history, past surgical history and problem list.   Objective:   Vitals:   08/31/22 0942 08/31/22 1027  BP: (!) 135/90 137/87  Pulse: (!) 101   Weight: 189 lb (85.7 kg)     Fetal Status: Fetal Heart Rate (bpm): 155   Movement: Present     General:  Alert, oriented and cooperative. Patient is in no acute distress.  Skin: Skin is warm and dry. No rash noted.   Cardiovascular: Normal heart rate noted  Respiratory: Normal respiratory effort, no problems with respiration noted  Abdomen: Soft, gravid, appropriate for gestational age.  Pain/Pressure: Present     Pelvic: Cultures obtained in the presence of a chaperone        Extremities: Normal range of motion.  Edema: Trace  Mental Status: Normal mood and affect. Normal behavior. Normal judgment and thought content.   Imaging: [place] MFM FETAL BPP WO NON STRESS  Result Date: 08/30/2022 ----------------------------------------------------------------------  OBSTETRICS REPORT                        (Signed Final 08/30/2022 08:46 am) ---------------------------------------------------------------------- Patient Info  ID #:       [id number]                          D.O.B.:  [birth year] (26 yrs)  Name:       Debra Hinton            Visit Date: 08/30/2022 08:19 am ---------------------------------------------------------------------- Performed By  Attending:        Ma Rings MD         Secondary Phy.:   [facility]  Performed By:     Neita Carp RDMS        Address:          [address]                                                             [address]  Referred By:      Isa Rankin         [facility]         [facility]  Fetal Care at                                                             [facility]  Ref. Address:     [address]           Visit Type:       MFM                    [place], [place]                    [address] ---------------------------------------------------------------------- Orders  #  Description                           Code        Ordered By  1  [place] MFM FETAL BPP WO NON               76819.01    YU FANG     STRESS ----------------------------------------------------------------------  #  Order #                     Accession #                Episode #  1  [id number]                   [id number]                 [id number] ---------------------------------------------------------------------- Indications  Hyperthyroid                                   O99.280 E05.90  Obesity complicating pregnancy, third          O99.213  trimester  Anxiety during pregnancy, third trimester      O99.343, F41.9  [redacted] weeks gestation of pregnancy                Z3A.36  Poor obstetric history: Previous fetal growth  O09.299  restriction (FGR)  Poor obstetric history: Previous               O09.299  preeclampsia / eclampsia/gestational HTN  LR NIPS  ---------------------------------------------------------------------- Vital Signs                                                 Height:        5'0" ---------------------------------------------------------------------- Fetal Evaluation  Num Of Fetuses:         1  Fetal Heart Rate(bpm):  143  Cardiac Activity:       Observed  Presentation:           Cephalic  Placenta:               Anterior  P. Cord Insertion:      Previously visualized  Amniotic Fluid  AFI FV:      Within normal limits  AFI Sum(cm)     %Tile       Largest Pocket(cm)  15.81  58          5.34  RUQ(cm)       RLQ(cm)       LUQ(cm)        LLQ(cm)  5.34          4.63          3.09           2.75 ---------------------------------------------------------------------- Biophysical Evaluation  Amniotic F.V:   Within normal limits       F. Tone:        Observed  F. Movement:    Observed                   Score:          8/8  F. Breathing:   Observed ---------------------------------------------------------------------- OB History  Gravidity:    4         Term:   3  Living:       3 ---------------------------------------------------------------------- Gestational Age  LMP:           36w 1d        Date:  12/20/21                  EDD:   09/26/22  Best:          36w 1d     Det. By:  LMP  (12/20/21)          EDD:   09/26/22 ---------------------------------------------------------------------- Comments  This patient was seen for a BPP due to hyperthyroidism and  maternal obesity.  She denies any problems since her last  exam.  A biophysical profile performed today was 8/8.  The AFI was 15.81 cm (within normal limits).  The patient reports that she will most likely have an induction  of labor scheduled on September 17, 2022.  She will have a BPP performed in your office next week.  She will return to our office in 2 weeks for a BPP and growth  scan. ----------------------------------------------------------------------                  Ma Rings, MD  Electronically Signed Final Report   08/30/2022 08:46 am ----------------------------------------------------------------------  [place] MFM FETAL BPP WO NON STRESS  Result Date: 08/23/2022 ----------------------------------------------------------------------  OBSTETRICS REPORT                       (Signed Final 08/23/2022 09:45 am) ---------------------------------------------------------------------- Patient Info  ID #:       [id number]                          D.O.B.:  09-Apr-1996 (26 yrs)  Name:       Debra Mutton Pavone            Visit Date: 08/23/2022 09:09 am ---------------------------------------------------------------------- Performed By  Attending:        Noralee Space MD        Secondary Phy.:   [facility]  Performed By:     Eden Lathe BS      Address:          [address]                    [address]  Referred By:      Isa Rankin         [facility]         [facility] MD                                Fetal Care at                                                             [facility]  Ref. Address:     [address]                    [place], [place]                    [address] ---------------------------------------------------------------------- Orders  #  Description                           Code        Ordered By  1  [place] MFM FETAL BPP WO NON               76819.01    RAVI [facility]     STRESS ----------------------------------------------------------------------  #  Order #                     Accession #                Episode #  1  [id number]                   [id number]                 [id number] ---------------------------------------------------------------------- Indications  Hyperthyroid                                   O99.280 E05.90  Obesity complicating pregnancy, third          O99.213  trimester  Anxiety during pregnancy, third trimester      O99.343, F41.9  Poor obstetric  history: Previous gestational   O09.299  HTN  AFP neg/LR NIPS  Poor obstetric history: Previous fetal growth  O09.299  restriction (FGR)  [redacted] weeks gestation of pregnancy                Z3A.35 ---------------------------------------------------------------------- Vital Signs                                                 Height:        5'0" ---------------------------------------------------------------------- Fetal Evaluation  Num Of Fetuses:         1  Fetal Heart Rate(bpm):  143  Cardiac Activity:       Observed  Presentation:           Cephalic  Placenta:  Posterior  P. Cord Insertion:      Previously visualized  Amniotic Fluid  AFI FV:      Within normal limits  AFI Sum(cm)     %Tile       Largest Pocket(cm)  13.89           49          4.72  RUQ(cm)       RLQ(cm)       LUQ(cm)        LLQ(cm)  4.72          3.69          2.56           2.92 ---------------------------------------------------------------------- Biophysical Evaluation  Amniotic F.V:   Within normal limits       F. Tone:        Observed  F. Movement:    Observed                   Score:          8/8  F. Breathing:   Observed ---------------------------------------------------------------------- OB History  Gravidity:    4         Term:   3  Living:       3 ---------------------------------------------------------------------- Gestational Age  LMP:           35w 1d        Date:  12/20/21                  EDD:   09/26/22  Best:          35w 1d     Det. By:  LMP  (12/20/21)          EDD:   09/26/22 ---------------------------------------------------------------------- Anatomy  Heart:                 Appears normal         Kidneys:                Appear normal                         (4CH, axis, and                         situs)  Stomach:               Appears normal, left   Bladder:                Appears normal                         sided ---------------------------------------------------------------------- Cervix Uterus Adnexa  Cervix   Not visualized (advanced GA >24wks)  Uterus  No abnormality visualized.  Right Ovary  Not visualized.  Left Ovary  Not visualized.  Cul De Sac  No free fluid seen.  Adnexa  No abnormality visualized ---------------------------------------------------------------------- Impression  Maternal hyperthyroidism.  Patient takes methimazole 5 mg  daily.  Obstetrical history significant for 3 term vaginal deliveries.  Maternal pulse 92/minute and blood pressure 105/74 mmHg.  On today's ultrasound, amniotic fluid is normal and good fetal  activity seen.  Fetal heart rate and rhythm are normal.  No  obvious evidence of fetal goiter.  Recent thyroid function test showed increased T4 T3.  Recent  TRAb levels are not increased.  I discussed timing of delivery. Patient  needs antithyroid  medications because of high T4 and T3 levels.  It is reasonable to consider delivery at 38 to 39 weeks'  gestation. Patient would like to schedule her induction of  labor on 09/17/22. ---------------------------------------------------------------------- Recommendations  -BPP next week at our office.  -Weekly NST or BPP till delivery.  -Delivery at 38 or 39 weeks. ----------------------------------------------------------------------                 Noralee Space, MD Electronically Signed Final Report   08/23/2022 09:45 am ----------------------------------------------------------------------  [place] MFM FETAL BPP WO NON STRESS  Result Date: 08/18/2022 ----------------------------------------------------------------------  OBSTETRICS REPORT                    (Corrected Final 08/18/2022 05:17 pm) ---------------------------------------------------------------------- Patient Info  ID #:       [id number]                          D.O.B.:  Aug 13, 1995 (26 yrs)  Name:       Debra Mutton Kloepfer            Visit Date: 08/18/2022 10:05 am ---------------------------------------------------------------------- Performed By  Attending:        Noralee Space MD         Secondary Phy.:   [facility]  Performed By:     Burt Knack RDMS     Address:          [address]                                                             [address]  Referred By:      Isa Rankin         Location:         [facility]                                Fetal Care at                                                             [facility]  Ref. Address:     [address]                    [place], [place]                    [address] ---------------------------------------------------------------------- Orders  #  Description                           Code        Ordered By  1  [place] MFM FETAL BPP WO NON               19147.82    RAVI SHANKAR     STRESS  2  [place] MFM OB FOLLOW UP  60454.09    [facility] ----------------------------------------------------------------------  #  Order #                     Accession #                Episode #  1  [id number]                   [id number]                 [id number]  2  [id number]                   [id number]                 [id number] ---------------------------------------------------------------------- Indications  Hyperthyroid                                   O99.280 E05.90  Obesity complicating pregnancy, third          O99.213  trimester  [redacted] weeks gestation of pregnancy                Z3A.34  Anxiety during pregnancy, third trimester      O99.343, F41.9  Poor obstetric history: Previous gestational   O09.299  HTN  AFP neg/LR NIPS  Poor obstetric history: Previous fetal growth  O09.299  restriction (FGR) ---------------------------------------------------------------------- Vital Signs                                                 Height:        5'0" ---------------------------------------------------------------------- Fetal Evaluation  Num Of Fetuses:         1  Fetal Heart Rate(bpm):  146  Cardiac Activity:       Observed  Presentation:           Cephalic  Placenta:               Posterior   P. Cord Insertion:      Previously visualized  Amniotic Fluid  AFI FV:      Within normal limits  AFI Sum(cm)     %Tile       Largest Pocket(cm)  16.11           58          7.21  RUQ(cm)       RLQ(cm)       LUQ(cm)        LLQ(cm)  7.21          4.4           1.98           2.52 ---------------------------------------------------------------------- Biophysical Evaluation  Amniotic F.V:   Within normal limits       F. Tone:        Observed  F. Movement:    Observed                   Score:          8/8  F. Breathing:   Observed ---------------------------------------------------------------------- Biometry  BPD:     85.63  mm     G. Age:  34w 4d         52  %    CI:  72.81   %    70 - 86                                                          FL/HC:      20.5   %    19.4 - 21.8  HC:    319.09   mm     G. Age:  36w 0d         53  %    HC/AC:      1.06        0.96 - 1.11  AC:    301.27   mm     G. Age:  34w 1d         45  %    FL/BPD:     76.2   %    71 - 87  FL:      65.27  mm     G. Age:  33w 5d         22  %    FL/AC:      21.7   %    20 - 24  Est. FW:    2373  gm      5 lb 4 oz     38  % ---------------------------------------------------------------------- OB History  Gravidity:    4         Term:   3  Living:       3 ---------------------------------------------------------------------- Gestational Age  LMP:           34w 3d        Date:  12/20/21                  EDD:   09/26/22  U/S Today:     34w 4d                                        EDD:   09/25/22  Best:          34w 3d     Det. By:  LMP  (12/20/21)          EDD:   09/26/22 ---------------------------------------------------------------------- Anatomy  Cranium:               Appears normal         Kidneys:                Appear normal  Heart:                 Appears normal         Bladder:                Appears normal                         (4CH, axis, and                         situs)  Stomach:               Appears normal, left  sided  Other:  All other anatomy was previously visualized ---------------------------------------------------------------------- Impression  Hyperthyroidism.  Patient takes methimazole.  Maternal pulse  rate is 83/minute and blood pressure 131/81 mmHg.  She does not have gestational diabetes.  Fetal growth is appropriate for gestational age.  Amniotic fluid  is normal and good fetal activity seen.  Cephalic presentation.  Antenatal testing is reassuring.  BPP 8/8.  This study was remotely read . ---------------------------------------------------------------------- Recommendations  -Continue weekly BPP till delivery. ----------------------------------------------------------------------                      Noralee Space, MD Electronically Signed Corrected Final Report  08/18/2022 05:17 pm ----------------------------------------------------------------------  [place] MFM OB FOLLOW UP  Result Date: 08/18/2022 ----------------------------------------------------------------------  OBSTETRICS REPORT                    (Corrected Final 08/18/2022 05:17 pm) ---------------------------------------------------------------------- Patient Info  ID #:       [id number]                          D.O.B.:  12/03/1995 (26 yrs)  Name:       Debra Mutton Hoak            Visit Date: 08/18/2022 10:05 am ---------------------------------------------------------------------- Performed By  Attending:        Noralee Space MD        Secondary Phy.:   [facility]  Performed By:     Burt Knack RDMS     Address:          [address]                                                             [address]  Referred By:      Isa Rankin         Location:         [facility]                                Fetal Care at                                                             [facility]  Ref. Address:     [address]                    [place], [place]                    [address]  ---------------------------------------------------------------------- Orders  #  Description                           Code        Ordered By  1  [place] MFM FETAL BPP WO NON  [address].60    RAVI SHANKAR     STRESS  2  [place] MFM OB FOLLOW UP                   45409.81    [facility] ----------------------------------------------------------------------  #  Order #                     Accession #                Episode #  1  [id number]                   [id number]                 [id number]  2  [id number]                   [id number]                 [id number] ---------------------------------------------------------------------- Indications  Hyperthyroid                                   O99.280 E05.90  Obesity complicating pregnancy, third          O99.213  trimester  [redacted] weeks gestation of pregnancy                Z3A.34  Anxiety during pregnancy, third trimester      O99.343, F41.9  Poor obstetric history: Previous gestational   O09.299  HTN  AFP neg/LR NIPS  Poor obstetric history: Previous fetal growth  O09.299  restriction (FGR) ---------------------------------------------------------------------- Vital Signs                                                 Height:        5'0" ---------------------------------------------------------------------- Fetal Evaluation  Num Of Fetuses:         1  Fetal Heart Rate(bpm):  146  Cardiac Activity:       Observed  Presentation:           Cephalic  Placenta:               Posterior  P. Cord Insertion:      Previously visualized  Amniotic Fluid  AFI FV:      Within normal limits  AFI Sum(cm)     %Tile       Largest Pocket(cm)  16.11           58          7.21  RUQ(cm)       RLQ(cm)       LUQ(cm)        LLQ(cm)  7.21          4.4           1.98           2.52 ---------------------------------------------------------------------- Biophysical Evaluation  Amniotic F.V:   Within normal limits       F. Tone:        Observed  F. Movement:    Observed                   Score:           8/8  F. Breathing:  Observed ---------------------------------------------------------------------- Biometry  BPD:     85.63  mm     G. Age:  34w 4d         52  %    CI:        72.81   %    70 - 86                                                          FL/HC:      20.5   %    19.4 - 21.8  HC:    319.09   mm     G. Age:  36w 0d         53  %    HC/AC:      1.06        0.96 - 1.11  AC:    301.27   mm     G. Age:  34w 1d         45  %    FL/BPD:     76.2   %    71 - 87  FL:      65.27  mm     G. Age:  33w 5d         22  %    FL/AC:      21.7   %    20 - 24  Est. FW:    2373  gm      5 lb 4 oz     38  % ---------------------------------------------------------------------- OB History  Gravidity:    4         Term:   3  Living:       3 ---------------------------------------------------------------------- Gestational Age  LMP:           34w 3d        Date:  12/20/21                  EDD:   09/26/22  U/S Today:     34w 4d                                        EDD:   09/25/22  Best:          34w 3d     Det. By:  LMP  (12/20/21)          EDD:   09/26/22 ---------------------------------------------------------------------- Anatomy  Cranium:               Appears normal         Kidneys:                Appear normal  Heart:                 Appears normal         Bladder:                Appears normal                         (4CH, axis, and  situs)  Stomach:               Appears normal, left                         sided  Other:  All other anatomy was previously visualized ---------------------------------------------------------------------- Impression  Hyperthyroidism.  Patient takes methimazole.  Maternal pulse  rate is 83/minute and blood pressure 131/81 mmHg.  She does not have gestational diabetes.  Fetal growth is appropriate for gestational age.  Amniotic fluid  is normal and good fetal activity seen.  Cephalic presentation.  Antenatal testing is reassuring.  BPP 8/8.  This study was remotely  read . ---------------------------------------------------------------------- Recommendations  -Continue weekly BPP till delivery. ----------------------------------------------------------------------                      Noralee Space, MD Electronically Signed Corrected Final Report  08/18/2022 05:17 pm ----------------------------------------------------------------------  [place] MFM FETAL BPP WO NON STRESS  Result Date: 08/11/2022 ----------------------------------------------------------------------  OBSTETRICS REPORT                       (Signed Final 08/11/2022 10:15 am) ---------------------------------------------------------------------- Patient Info  ID #:       [id number]                          D.O.B.:  1995/11/11 (26 yrs)  Name:       Debra Mutton Behrman            Visit Date: 08/11/2022 08:57 am ---------------------------------------------------------------------- Performed By  Attending:        Lin Landsman      Secondary Phy.:   [facility] Mila Merry                    MD  Performed By:     Burt Knack RDMS     Address:          [address]                                                             [address]  Referred By:      Isa Rankin         Location:         [facility]                                Fetal Care at                                                             [facility]  Ref. Address:     [address]                    [place], [place]  [address] ---------------------------------------------------------------------- Orders  #  Description                           Code        Ordered By  1  [place] MFM FETAL BPP WO NON               76819.01    [facility]     STRESS                                            [facility] ----------------------------------------------------------------------  #  Order #                     Accession #                Episode #  1  [id number]                   [id number]                 [id number]  ---------------------------------------------------------------------- Indications  [redacted] weeks gestation of pregnancy                Z3A.33  Hyperthyroid                                   O99.280 E05.90  Obesity complicating pregnancy, third          O99.213  trimester  Anxiety during pregnancy, third trimester      O99.343, F41.9  Poor obstetric history: Previous gestational   O09.299  HTN  AFP neg/LR NIPS  Poor obstetric history: Previous fetal growth  O09.299  restriction (FGR)  Encounter for other antenatal screening        Z36.2  follow-up  [redacted] weeks gestation of pregnancy                Z3A.30 ---------------------------------------------------------------------- Vital Signs                                                 Height:        5'0" ---------------------------------------------------------------------- Fetal Evaluation  Num Of Fetuses:         1  Fetal Heart Rate(bpm):  142  Cardiac Activity:       Observed  Presentation:           Cephalic  Placenta:               Posterior  P. Cord Insertion:      Previously visualized  Amniotic Fluid  AFI FV:      Within normal limits  AFI Sum(cm)     %Tile       Largest Pocket(cm)  13.97           48          5.53  RUQ(cm)       RLQ(cm)       LUQ(cm)        LLQ(cm)  4.39          5.53          1.85  2.2 ---------------------------------------------------------------------- Biophysical Evaluation  Amniotic F.V:   Within normal limits       F. Tone:        Observed  F. Movement:    Observed                   Score:          8/8  F. Breathing:   Observed ---------------------------------------------------------------------- OB History  Gravidity:    4         Term:   3  Living:       3 ---------------------------------------------------------------------- Gestational Age  LMP:           33w 3d        Date:  12/20/21                  EDD:   09/26/22  Best:          33w 3d     Det. By:  LMP  (12/20/21)          EDD:   09/26/22  ---------------------------------------------------------------------- Anatomy  Cranium:               Appears normal         Kidneys:                Appear normal  Heart:                 Appears normal         Bladder:                Appears normal                         (4CH, axis, and                         situs)  Stomach:               Appears normal, left                         sided  Other:  All anatomy was previously seen ---------------------------------------------------------------------- Impression  Antenatal testing performed given maternal hyperthyroidism  The biophysical profile was 8/8 with good fetal movement and  amniotic fluid volume.  Recent labs drawn on 3/12 showing sitll elevated FT 4 & 3->  with TSH 0.156. She is taking 5 mg Methimazole daily.  She saw her endocrinologist 1 week ago and she reports that  they will continue on her current therapy. ---------------------------------------------------------------------- Recommendations  Continue weekly testing and serial growth as previously  scheduled. ----------------------------------------------------------------------              Lin Landsman, MD Electronically Signed Final Report   08/11/2022 10:15 am ----------------------------------------------------------------------   Assessment and Plan:  Pregnancy: [id number] at [redacted]w[redacted]d 1. Elevated blood pressure affecting pregnancy, antepartum Normal recheck today.  Will check labs. Enrolled in Babyscripts advised to check BP daily and enter into app.  If BP continues to be elevated, will need to be induced at 37 weeks for [facility].  Patient is aware.  Repeat BP to be checked on 09/01/21. - Protein / creatinine ratio, urine - CBC - Comprehensive metabolic panel - Babyscripts Schedule Optimization - Enroll Patient in PreNatal Babyscripts  2. Hyperthyroidism affecting pregnancy in third trimester 3. Graves disease On methimazole, getting antenatal testing and scans. IOL scheduled on  09/17/22 as  per MFM recommendations. Orders signed and held.  4. History of prior pregnancy with IUGR Normal EFW.  5. Obesity in pregnancy, antepartum TWG 24 lbs.  6. [redacted] weeks gestation of pregnancy 7. Supervision of high risk pregnancy in third trimester Cultures obtained, will follow up results and manage accordingly. - Cervicovaginal ancillary only - Strep Gp B NAA Preterm labor symptoms and general obstetric precautions including but not limited to vaginal bleeding, contractions, leaking of fluid and fetal movement were reviewed in detail with the patient. Please refer to After Visit Summary for other counseling recommendations.   Return in about 2 days (around 09/02/2022) for BP Check  OB visits and antenatal testing as scheduled.  Future Appointments  Date Time Provider Department Center  09/07/2022  1:10 PM [facility]  09/07/2022  1:50 PM Sylvan Sookdeo, Jethro Bastos, MD [facility]  09/13/2022  9:30 AM [facility]  09/14/2022  9:35 AM Constant, Gigi Gin, MD [facility]  09/17/2022 12:00 AM [facility] SCHED ROOM [facility] None  09/21/2022  9:35 AM Quamir Willemsen, Jethro Bastos, MD [facility]  10/25/2022  3:00 PM Shamleffer, Konrad Dolores, MD [facility] None    Jaynie Collins, MD

## 2022-09-01 LAB — COMPREHENSIVE METABOLIC PANEL
ALT: 10 IU/L (ref 0–32)
AST: 16 IU/L (ref 0–40)
Albumin/Globulin Ratio: 1.6 (ref 1.2–2.2)
Albumin: 3.7 g/dL — ABNORMAL LOW (ref 4.0–5.0)
Alkaline Phosphatase: 154 IU/L — ABNORMAL HIGH (ref 44–121)
BUN/Creatinine Ratio: 9 (ref 9–23)
BUN: 6 mg/dL (ref 6–20)
Bilirubin Total: 0.5 mg/dL (ref 0.0–1.2)
CO2: 20 mmol/L (ref 20–29)
Calcium: 8.7 mg/dL (ref 8.7–10.2)
Chloride: 102 mmol/L (ref 96–106)
Creatinine, Ser: 0.65 mg/dL (ref 0.57–1.00)
Globulin, Total: 2.3 g/dL (ref 1.5–4.5)
Glucose: 86 mg/dL (ref 70–99)
Potassium: 3.9 mmol/L (ref 3.5–5.2)
Sodium: 139 mmol/L (ref 134–144)
Total Protein: 6 g/dL (ref 6.0–8.5)
eGFR: 124 mL/min/{1.73_m2} (ref >59)

## 2022-09-01 LAB — CERVICOVAGINAL ANCILLARY ONLY
Chlamydia: NEGATIVE
Comment: NEGATIVE
Comment: NORMAL
Neisseria Gonorrhea: NEGATIVE

## 2022-09-01 LAB — CBC
Hematocrit: 38.1 % (ref 34.0–46.6)
Hemoglobin: 12.3 g/dL (ref 11.1–15.9)
MCH: 27.7 pg (ref 26.6–33.0)
MCHC: 32.3 g/dL (ref 31.5–35.7)
MCV: 86 fL (ref 79–97)
Platelets: 196 10*3/uL (ref 150–450)
RBC: 4.44 x10E6/uL (ref 3.77–5.28)
RDW: 13.1 % (ref 11.7–15.4)
WBC: 5.9 10*3/uL (ref 3.4–10.8)

## 2022-09-01 LAB — PROTEIN / CREATININE RATIO, URINE
Creatinine, Urine: 118.4 mg/dL
Protein, Ur: 13.7 mg/dL
Protein/Creat Ratio: 116 mg/g creat (ref 0–200)

## 2022-09-02 ENCOUNTER — Encounter: Payer: Self-pay | Admitting: Obstetrics and Gynecology

## 2022-09-02 ENCOUNTER — Ambulatory Visit (INDEPENDENT_AMBULATORY_CARE_PROVIDER_SITE_OTHER): Payer: Medicaid Other | Admitting: *Deleted

## 2022-09-02 ENCOUNTER — Encounter: Payer: Self-pay | Admitting: Obstetrics & Gynecology

## 2022-09-02 DIAGNOSIS — O135 Gestational [pregnancy-induced] hypertension without significant proteinuria, complicating the puerperium: Secondary | ICD-10-CM

## 2022-09-02 LAB — STREP GP B NAA: Strep Gp B NAA: NEGATIVE

## 2022-09-02 NOTE — Progress Notes (Signed)
Subjective:  Debra Hinton is a 27 y.o. female here for BP check.   Hypertension ROS: Patient denies any headaches, visual symptoms, RUQ/epigastric pain or other concerning symptoms.  Objective:  BP 134/89   LMP 12/20/2021   Pt feeling nauseated and tired. .  General exam BP noted to be stable today in office.    Assessment:   Blood Pressure stable.   Plan:  Continue to check BP at home. Advised pt if elevated tomorrow to call and we will bring her in to recheck it. And if over the weekend her BP is elevated and has a HA that does not resolve to go to the hospital, if not we will see her on 09/07/22 for ROB.   Scheryl Marten, RN .

## 2022-09-03 ENCOUNTER — Ambulatory Visit (INDEPENDENT_AMBULATORY_CARE_PROVIDER_SITE_OTHER): Payer: Medicaid Other | Admitting: *Deleted

## 2022-09-03 VITALS — BP 131/92 | HR 127

## 2022-09-03 DIAGNOSIS — Z3A36 36 weeks gestation of pregnancy: Secondary | ICD-10-CM

## 2022-09-03 DIAGNOSIS — O133 Gestational [pregnancy-induced] hypertension without significant proteinuria, third trimester: Secondary | ICD-10-CM

## 2022-09-03 DIAGNOSIS — O139 Gestational [pregnancy-induced] hypertension without significant proteinuria, unspecified trimester: Secondary | ICD-10-CM | POA: Insufficient documentation

## 2022-09-03 NOTE — Progress Notes (Signed)
Subjective:  Debra Hinton is a 27 y.o. female here for BP check.   Hypertension ROS: Patient denies any headaches, visual symptoms, RUQ/epigastric pain or other concerning symptoms.  Objective:  BP (!) 131/92   Pulse (!) 127   LMP 12/20/2021   Pt still feeling very tired and nauseated.  General exam BP noted to be elevated  today in office.    Assessment:   Blood Pressure  elevated and will scheduled her for 37 week IOL. Marland Kitchen   Plan:  Follow up as recommended. Scheryl Marten, RN

## 2022-09-05 ENCOUNTER — Inpatient Hospital Stay (HOSPITAL_COMMUNITY)
Admission: RE | Admit: 2022-09-05 | Discharge: 2022-09-07 | DRG: 807 | Disposition: A | Discharge status: Home / Self Care | Payer: Medicaid Other | Attending: Obstetrics & Gynecology | Admitting: Obstetrics & Gynecology

## 2022-09-05 ENCOUNTER — Encounter (HOSPITAL_COMMUNITY): Payer: Self-pay | Admitting: Obstetrics & Gynecology

## 2022-09-05 ENCOUNTER — Inpatient Hospital Stay (HOSPITAL_COMMUNITY): Payer: Medicaid Other | Admitting: Anesthesiology

## 2022-09-05 ENCOUNTER — Inpatient Hospital Stay (HOSPITAL_COMMUNITY): Payer: Medicaid Other

## 2022-09-05 DIAGNOSIS — O99283 Endocrine, nutritional and metabolic diseases complicating pregnancy, third trimester: Secondary | ICD-10-CM | POA: Diagnosis present

## 2022-09-05 DIAGNOSIS — Z7982 Long term (current) use of aspirin: Secondary | ICD-10-CM | POA: Diagnosis not present

## 2022-09-05 DIAGNOSIS — O134 Gestational [pregnancy-induced] hypertension without significant proteinuria, complicating childbirth: Secondary | ICD-10-CM | POA: Diagnosis present

## 2022-09-05 DIAGNOSIS — Z3A37 37 weeks gestation of pregnancy: Secondary | ICD-10-CM

## 2022-09-05 DIAGNOSIS — O99284 Endocrine, nutritional and metabolic diseases complicating childbirth: Secondary | ICD-10-CM | POA: Diagnosis present

## 2022-09-05 DIAGNOSIS — F411 Generalized anxiety disorder: Secondary | ICD-10-CM | POA: Diagnosis present

## 2022-09-05 DIAGNOSIS — Z87891 Personal history of nicotine dependence: Secondary | ICD-10-CM

## 2022-09-05 DIAGNOSIS — O99214 Obesity complicating childbirth: Secondary | ICD-10-CM | POA: Diagnosis present

## 2022-09-05 DIAGNOSIS — O99344 Other mental disorders complicating childbirth: Secondary | ICD-10-CM | POA: Diagnosis present

## 2022-09-05 DIAGNOSIS — E0590 Thyrotoxicosis, unspecified without thyrotoxic crisis or storm: Secondary | ICD-10-CM | POA: Diagnosis present

## 2022-09-05 DIAGNOSIS — O0993 Supervision of high risk pregnancy, unspecified, third trimester: Secondary | ICD-10-CM

## 2022-09-05 DIAGNOSIS — O99210 Obesity complicating pregnancy, unspecified trimester: Secondary | ICD-10-CM | POA: Diagnosis present

## 2022-09-05 DIAGNOSIS — O139 Gestational [pregnancy-induced] hypertension without significant proteinuria, unspecified trimester: Secondary | ICD-10-CM | POA: Diagnosis present

## 2022-09-05 LAB — CBC WITH DIFFERENTIAL/PLATELET
Abs Immature Granulocytes: 0.03 10*3/uL (ref 0.00–0.07)
Basophils Absolute: 0 10*3/uL (ref 0.0–0.1)
Basophils Relative: 0 %
Eosinophils Absolute: 0.1 10*3/uL (ref 0.0–0.5)
Eosinophils Relative: 1 %
HCT: 33.5 % — ABNORMAL LOW (ref 36.0–46.0)
Hemoglobin: 11.6 g/dL — ABNORMAL LOW (ref 12.0–15.0)
Immature Granulocytes: 0 %
Lymphocytes Relative: 22 %
Lymphs Abs: 1.7 10*3/uL (ref 0.7–4.0)
MCH: 28.2 pg (ref 26.0–34.0)
MCHC: 34.6 g/dL (ref 30.0–36.0)
MCV: 81.5 fL (ref 80.0–100.0)
Monocytes Absolute: 0.7 10*3/uL (ref 0.1–1.0)
Monocytes Relative: 8 %
Neutro Abs: 5.5 10*3/uL (ref 1.7–7.7)
Neutrophils Relative %: 69 %
Platelets: 201 10*3/uL (ref 150–400)
RBC: 4.11 MIL/uL (ref 3.87–5.11)
RDW: 12.9 % (ref 11.5–15.5)
WBC: 8 10*3/uL (ref 4.0–10.5)
nRBC: 0 % (ref 0.0–0.2)

## 2022-09-05 LAB — TYPE AND SCREEN
ABO/RH(D): O POS
Antibody Screen: NEGATIVE

## 2022-09-05 LAB — COMPREHENSIVE METABOLIC PANEL
ALT: 12 U/L (ref 0–44)
AST: 17 U/L (ref 15–41)
Albumin: 2.7 g/dL — ABNORMAL LOW (ref 3.5–5.0)
Alkaline Phosphatase: 140 U/L — ABNORMAL HIGH (ref 38–126)
Anion gap: 11 (ref 5–15)
BUN: 6 mg/dL (ref 6–20)
CO2: 20 mmol/L — ABNORMAL LOW (ref 22–32)
Calcium: 8.6 mg/dL — ABNORMAL LOW (ref 8.9–10.3)
Chloride: 104 mmol/L (ref 98–111)
Creatinine, Ser: 0.62 mg/dL (ref 0.44–1.00)
GFR, Estimated: >60 mL/min (ref >60)
Glucose, Bld: 87 mg/dL (ref 70–99)
Potassium: 3.5 mmol/L (ref 3.5–5.1)
Sodium: 135 mmol/L (ref 135–145)
Total Bilirubin: 1 mg/dL (ref 0.3–1.2)
Total Protein: 6.2 g/dL — ABNORMAL LOW (ref 6.5–8.1)

## 2022-09-05 LAB — CBC
HCT: 37 % (ref 36.0–46.0)
Hemoglobin: 12.3 g/dL (ref 12.0–15.0)
MCH: 27.6 pg (ref 26.0–34.0)
MCHC: 33.2 g/dL (ref 30.0–36.0)
MCV: 83 fL (ref 80.0–100.0)
Platelets: 229 10*3/uL (ref 150–400)
RBC: 4.46 MIL/uL (ref 3.87–5.11)
RDW: 12.9 % (ref 11.5–15.5)
WBC: 5.7 10*3/uL (ref 4.0–10.5)
nRBC: 0 % (ref 0.0–0.2)

## 2022-09-05 MED ORDER — PHENYLEPHRINE 80 MCG/ML (10ML) SYRINGE FOR IV PUSH (FOR BLOOD PRESSURE SUPPORT): 80.0000 ug | PREFILLED_SYRINGE | INTRAVENOUS | Status: DC | PRN

## 2022-09-05 MED ORDER — EPHEDRINE 5 MG/ML INJ: 10.0000 mg | INTRAVENOUS | Status: DC | PRN

## 2022-09-05 MED ORDER — OXYTOCIN BOLUS FROM INFUSION
333.0000 mL | Freq: Once | INTRAVENOUS | Status: AC
  Administered 2022-09-06: 600 mL via INTRAVENOUS

## 2022-09-05 MED ORDER — FENTANYL-BUPIVACAINE-NACL 0.5-0.125-0.9 MG/250ML-% EP SOLN
12.0000 mL/h | EPIDURAL | Status: DC | PRN
  Administered 2022-09-05: 12 mL/h via EPIDURAL
  Filled 2022-09-05: qty 250

## 2022-09-05 MED ORDER — OXYTOCIN-SODIUM CHLORIDE 30-0.9 UT/500ML-% IV SOLN
2.5000 [IU]/h | INTRAVENOUS | Status: DC
  Filled 2022-09-05: qty 500

## 2022-09-05 MED ORDER — HYDROXYZINE HCL 50 MG PO TABS: 50.0000 mg | ORAL_TABLET | Freq: Four times a day (QID) | ORAL | Status: DC | PRN

## 2022-09-05 MED ORDER — TERBUTALINE SULFATE 1 MG/ML IJ SOLN: 0.2500 mg | Freq: Once | INTRAMUSCULAR | Status: DC | PRN

## 2022-09-05 MED ORDER — OXYTOCIN-SODIUM CHLORIDE 30-0.9 UT/500ML-% IV SOLN: 1.0000 m[IU]/min | INTRAVENOUS | Status: DC

## 2022-09-05 MED ORDER — LACTATED RINGERS IV SOLN
500.0000 mL | INTRAVENOUS | Status: DC | PRN
  Administered 2022-09-05: 1000 mL via INTRAVENOUS
  Administered 2022-09-06: 500 mL via INTRAVENOUS

## 2022-09-05 MED ORDER — FENTANYL-BUPIVACAINE-NACL 0.5-0.125-0.9 MG/250ML-% EP SOLN: 12.0000 mL/h | EPIDURAL | Status: DC | PRN

## 2022-09-05 MED ORDER — LIDOCAINE HCL (PF) 1 % IJ SOLN: 30.0000 mL | INTRAMUSCULAR | Status: DC | PRN

## 2022-09-05 MED ORDER — LACTATED RINGERS IV SOLN: INTRAVENOUS | Status: DC

## 2022-09-05 MED ORDER — FENTANYL CITRATE (PF) 100 MCG/2ML IJ SOLN
50.0000 ug | INTRAMUSCULAR | Status: DC | PRN
  Administered 2022-09-05 (×2): 100 ug via INTRAVENOUS
  Administered 2022-09-05: 50 ug via INTRAVENOUS
  Filled 2022-09-05 (×3): qty 2

## 2022-09-05 MED ORDER — MISOPROSTOL 25 MCG QUARTER TABLET
25.0000 ug | ORAL_TABLET | Freq: Once | ORAL | Status: AC
  Administered 2022-09-05: 25 ug via VAGINAL
  Filled 2022-09-05: qty 1

## 2022-09-05 MED ORDER — LIDOCAINE-EPINEPHRINE (PF) 1.5 %-1:200000 IJ SOLN
INTRAMUSCULAR | Status: DC | PRN
  Administered 2022-09-05: 2 mL via EPIDURAL
  Administered 2022-09-05: 3 mL via EPIDURAL

## 2022-09-05 MED ORDER — LACTATED RINGERS IV SOLN
500.0000 mL | Freq: Once | INTRAVENOUS | Status: AC
  Administered 2022-09-05: 500 mL via INTRAVENOUS

## 2022-09-05 MED ORDER — OXYCODONE-ACETAMINOPHEN 5-325 MG PO TABS: 1.0000 | ORAL_TABLET | ORAL | Status: DC | PRN

## 2022-09-05 MED ORDER — ONDANSETRON HCL 4 MG/2ML IJ SOLN
4.0000 mg | Freq: Four times a day (QID) | INTRAMUSCULAR | Status: DC | PRN
  Administered 2022-09-05 – 2022-09-06 (×2): 4 mg via INTRAVENOUS
  Filled 2022-09-05 (×2): qty 2

## 2022-09-05 MED ORDER — MISOPROSTOL 50MCG HALF TABLET
50.0000 ug | ORAL_TABLET | Freq: Once | ORAL | Status: AC
  Administered 2022-09-05: 50 ug via ORAL
  Filled 2022-09-05: qty 1

## 2022-09-05 MED ORDER — LIDOCAINE-EPINEPHRINE (PF) 1.5 %-1:200000 IJ SOLN: INTRAMUSCULAR | Status: DC | PRN

## 2022-09-05 MED ORDER — DIPHENHYDRAMINE HCL 50 MG/ML IJ SOLN: 12.5000 mg | INTRAMUSCULAR | Status: DC | PRN

## 2022-09-05 MED ORDER — SOD CITRATE-CITRIC ACID 500-334 MG/5ML PO SOLN
30.0000 mL | ORAL | Status: DC | PRN
  Administered 2022-09-05: 30 mL via ORAL
  Filled 2022-09-05: qty 30

## 2022-09-05 MED ORDER — FLEET ENEMA 7-19 GM/118ML RE ENEM: 1.0000 | ENEMA | Freq: Every day | RECTAL | Status: DC | PRN

## 2022-09-05 MED ORDER — CALCIUM CARBONATE ANTACID 500 MG PO CHEW
400.0000 mg | CHEWABLE_TABLET | Freq: Once | ORAL | Status: AC
  Administered 2022-09-06: 400 mg via ORAL
  Filled 2022-09-05: qty 2

## 2022-09-05 MED ORDER — ACETAMINOPHEN 325 MG PO TABS: 650.0000 mg | ORAL_TABLET | ORAL | Status: DC | PRN

## 2022-09-05 MED ORDER — ZOLPIDEM TARTRATE 5 MG PO TABS: 5.0000 mg | ORAL_TABLET | Freq: Every evening | ORAL | Status: DC | PRN

## 2022-09-05 MED ORDER — OXYCODONE-ACETAMINOPHEN 5-325 MG PO TABS: 2.0000 | ORAL_TABLET | ORAL | Status: DC | PRN

## 2022-09-05 NOTE — Progress Notes (Signed)
Labor Progress Note  Debra Hinton is a 27 y.o. [phone] at [redacted]w[redacted]d presented for IOL gHTN  S: No acute concerns.   O:  BP 130/84   Pulse 91   Temp 98.6 F (37 C) (Oral)   Resp 16   Ht 5' (1.524 m)   Wt 86 kg   LMP 12/20/2021   SpO2 99%   BMI 37.01 kg/m  EFM: 140 bpm/Moderate variability/ 15x15 accels/ None decels  CVE: Dilation: 5 Effacement (%): 90 Cervical Position: Posterior Station: -2 Presentation: Vertex Exam by:: Donnie Aho, RN   A&P: 27 y.o. [phone] [redacted]w[redacted]d  here for IOL as above  #Labor: Progressing well. Will consider AROM at next exam. The patient prefers to SROM.  #Pain: Family/Friend support and PO/IV pain meds #FWB: CAT 1 #GBS negative #gHTN: PreE labs pending. BP well controlled. CTM  Lavonda Jumbo, DO FMOB Fellow, Faculty practice [facility], Center for [facility] 09/05/22  11:53 PM

## 2022-09-05 NOTE — Plan of Care (Signed)

## 2022-09-05 NOTE — Anesthesia Procedure Notes (Signed)
Epidural Patient location during procedure: OB Start time: 09/05/2022 10:41 PM End time: 09/05/2022 10:58 PM  Staffing Anesthesiologist: Lewie Loron, MD Performed: anesthesiologist   Preanesthetic Checklist Completed: patient identified, IV checked, risks and benefits discussed, monitors and equipment checked, pre-op evaluation and timeout performed  Epidural Patient position: sitting Prep: DuraPrep and site prepped and draped Patient monitoring: heart rate, continuous pulse ox and blood pressure Approach: midline Location: L2-L3 Injection technique: LOR air and LOR saline  Needle:  Needle type: Tuohy  Needle gauge: 17 G Needle length: 9 cm Needle insertion depth: 7 cm Catheter type: closed end flexible Catheter size: 19 Gauge Catheter at skin depth: 13 cm Test dose: negative  Assessment Sensory level: T8 Events: blood not aspirated, no cerebrospinal fluid, injection not painful, no injection resistance, no paresthesia and negative IV test  Additional Notes Reason for block:procedure for pain

## 2022-09-05 NOTE — Anesthesia Preprocedure Evaluation (Signed)
Anesthesia Evaluation  Patient identified by MRN, date of birth, ID band Patient awake    Reviewed: Allergy & Precautions, H&P , Patient's Chart, lab work & pertinent test results  History of Anesthesia Complications Negative for: history of anesthetic complications  Airway Mallampati: III  TM Distance: <3 FB Neck ROM: full    Dental no notable dental hx.    Pulmonary neg pulmonary ROS, former smoker   Pulmonary exam normal        Cardiovascular Exercise Tolerance: Good hypertension, Normal cardiovascular exam     Neuro/Psych  PSYCHIATRIC DISORDERS Anxiety        GI/Hepatic ,GERD  Medicated and Controlled,,  Endo/Other   Hyperthyroidism   Renal/GU   negative genitourinary   Musculoskeletal   Abdominal  (+) + obesegravid  Peds  Hematology negative hematology ROS (+)   Anesthesia Other Findings Past Medical History: No date: Anxiety No date: Anxiety and depression No date: Depressive disorder No date: GERD (gastroesophageal reflux disease) 2019: Hyperthyroidism     Comment:  Possible Graves disease No date: IBS (irritable bowel syndrome) No date: Irritable bowel syndrome (IBS) No date: Thyroid disease  Past Surgical History: No date: NO PAST SURGERIES  BMI    Body Mass Index: 35.74 kg/m      Reproductive/Obstetrics (+) Pregnancy                             Anesthesia Physical Anesthesia Plan  ASA: 2  Anesthesia Plan: Epidural   Post-op Pain Management:    Induction:   PONV Risk Score and Plan:   Airway Management Planned:   Additional Equipment:   Intra-op Plan:   Post-operative Plan:   Informed Consent: I have reviewed the patients History and Physical, chart, labs and discussed the procedure including the risks, benefits and alternatives for the proposed anesthesia with the patient or authorized representative who has indicated his/her understanding and  acceptance.       Plan Discussed with:   Anesthesia Plan Comments:         Anesthesia Quick Evaluation

## 2022-09-05 NOTE — H&P (Signed)
OBSTETRIC ADMISSION HISTORY AND PHYSICAL  Debra Hinton is a 27 y.o. female [phone] with IUP at [redacted]w[redacted]d by LMP presenting for IOL gHTN. She reports +FMs, No LOF, no VB, no blurry vision, headaches or peripheral edema, and RUQ pain.  She plans on Breast feeding. She request vasectomy for birth control. She received her prenatal care at  [facility]    Dating: By LMP --->  Estimated Date of Delivery: 09/26/22  Sono:   , CWD, normal anatomy, cephalic presentation, 2373g, 45% EFW   Prenatal History/Complications:  Patient Active Problem List   Diagnosis Date Noted   Hyperthyroidism affecting pregnancy in third trimester 09/05/2022   Gestational hypertension 09/03/2022   Obesity in pregnancy, antepartum 08/17/2022   Supervision of high risk pregnancy in third trimester 04/12/2022   Hyperthyroidism complicating pregnancy 04/12/2022   History of prior pregnancy with IUGR 02/09/2021   Graves disease 07/26/2018   GERD (gastroesophageal reflux disease) 05/31/2018   Hyperthyroidism 04/10/2018   Irritable bowel syndrome with constipation 02/22/2018   Generalized anxiety disorder 02/22/2018   Vitamin D deficiency 02/22/2018    Nursing Staff Provider  Office Location [place] Dating  09/26/2022, by Last Menstrual Period  [facility] Model [ X] Traditional  Centering  Mom-Baby Dyad Anatomy US    Language  English    Flu Vaccine   Declined  04/12/2022 Genetic/Carrier Screen  NIPS:    AFP:    Horizon:  TDaP Vaccine   07/20/2021 Hgb A1C or  GTT Early  Third trimester wnl  COVID Vaccine    LAB RESULTS   Rhogam  --/--/O POS (04/14 0815)  Blood Type --/--/O POS (04/14 0815)   Baby Feeding Plan  Breast Antibody NEG (04/14 0815)  Contraception  Vasectomy  Rubella Immune (10/26 0000)  Circumcision  N/A RPR Non Reactive (02/13 0951)   Pediatrician   HBsAg Negative (10/25 0000)   Support Person  Husband HCVAb Negative (10/25 0000)   Prenatal Classes  Declined HIV Non Reactive (02/13 0951)      BTL Consent  GBS Negative/-- (04/09 1000)Negative  VBAC Consent  Pap Diagnosis  Date Value Ref Range Status  06/09/2021   Final   - Negative for intraepithelial lesion or malignancy (NILM)         DME Rx  BP cuff  Weight Scale Waterbirth   Class  Consent  CNM visit  PHQ9 & GAD7 [  ] new OB [  ] 28 weeks  [  ] 36 weeks Induction   Orders Entered Foley Y/N      Past Medical History: Past Medical History:  Diagnosis Date   Anxiety    Anxiety and depression    Depressive disorder    GERD (gastroesophageal reflux disease)    Gestational hypertension, third trimester    Hyperthyroidism 2019   Possible Graves disease   IBS (irritable bowel syndrome)    Irritable bowel syndrome (IBS)    Thyroid disease     Past Surgical History: Past Surgical History:  Procedure Laterality Date   NO PAST SURGERIES      Obstetrical History: OB History     Gravida  4   Para  3   Term  3   Preterm  0   AB  0   Living  3      SAB  0   IAB  0   Ectopic  0   Multiple  0   Live Births  3           Social History Social History   Socioeconomic History   Marital status: Married    Spouse name: Jill Alexanders   Number of children: 1   Years of education: Not on file   Highest education level: Not on file  Occupational History   Not on file  Tobacco Use   Smoking status: Former    Types: Cigarettes    Quit date: 07/03/2018    Years since quitting: 4.1   Smokeless tobacco: Never  Vaping Use   Vaping Use: Former  Substance and Sexual Activity   Alcohol use: Not Currently    Comment: 2 drinks/month   Drug use: Never    Types: Marijuana   Sexual activity: Yes    Birth control/protection: None  Other Topics Concern   Not on file  Social History Narrative   ** Merged History Encounter **       Social Determinants of Health   Financial Resource Strain: Not on file  Food Insecurity: Not on file  Transportation Needs: Not on file  Physical  Activity: Not on file  Stress: Not on file  Social Connections: Not on file    Family History: Family History  Problem Relation Age of Onset   Breast cancer Other         mggm   Hypertension Mother    Hyperlipidemia Father    Thyroid disease Maternal Grandmother    Dementia Maternal Grandmother    Heart attack Maternal Grandfather    Diabetes Paternal Grandmother    Alcoholism Paternal Grandmother    Cancer Paternal Grandfather        pancreatic   Healthy Brother    Hypothyroidism Maternal Aunt    Hypothyroidism Maternal Aunt    Breast cancer Maternal Aunt        30s    Allergies: Allergies  Allergen Reactions   Ptu [Propylthiouracil] Other (See Comments)    Chemical taste    Medications Prior to Admission  Medication Sig Dispense Refill Last Dose   albuterol (VENTOLIN HFA) 108 (90 Base) MCG/ACT inhaler Inhale 2 puffs into the lungs every 6 (six) hours as needed for wheezing or shortness of breath. 8 g 2    aspirin EC 81 MG tablet Take 81 mg by mouth daily. Swallow whole.      fluconazole (DIFLUCAN) 150 MG tablet Take 1 tablet (150 mg total) by mouth daily. Take if developing a yeast infection after antibiotic use 1 tablet 0    methimazole (TAPAZOLE) 5 MG tablet Take 1 tablet (5 mg total) by mouth daily. 90 tablet 1    omeprazole (PRILOSEC) 10 MG capsule Take 10 mg by mouth daily.      Prenatal Vit-Fe Fumarate-FA (PRENATAL VITAMINS PO) Take by mouth.        Review of Systems   All systems reviewed and negative except as stated in HPI  Temperature 98.9 F (37.2 C), temperature source Oral, resp. rate 16, height 5' (1.524 m), weight 86 kg, last menstrual period 12/20/2021, SpO2 100 %, currently breastfeeding. General appearance: alert, cooperative, and appears stated age Lungs: clear to auscultation bilaterally Heart: regular rate and rhythm Abdomen: soft, non-tender; bowel sounds normal Pelvic: no lesions Extremities: Homans sign is negative, no sign of  DVT Presentation: cephalic Fetal monitoringBaseline: 120 bpm, Variability: Good {> 6 bpm), Accelerations: Reactive, and Decelerations: Absent Uterine activityFrequency: Every 3-4 minutes Dilation: 1 Effacement (%): 50 Station: -3 Exam by:: Michel Harrow, RN  Prenatal labs: ABO, Rh: --/--/O POS (04/14 0815) Antibody: NEG (04/14 0815) Rubella: Immune (10/26 0000) RPR: Non Reactive (02/13 0951)  HBsAg: Negative (10/25 0000)  HIV: Non Reactive (02/13 0951)  GBS: Negative/-- (04/09 1000)  1 hr Glucola NML Genetic screening  none Anatomy US nml  Prenatal Transfer Tool  Maternal Diabetes: No Genetic Screening: Declined Maternal Ultrasounds/Referrals: Normal Fetal Ultrasounds or other Referrals:  None Maternal Substance Abuse:  No Significant Maternal Medications:  Meds include: Syntroid Significant Maternal Lab Results:  Group B Strep negative Number of Prenatal Visits:greater than 3 verified prenatal visits Other Comments:  None  Results for orders placed or performed during the hospital encounter of 09/05/22 (from the past 24 hour(s))  Type and screen   Collection Time: 09/05/22  8:15 AM  Result Value Ref Range   ABO/RH(D) O POS    Antibody Screen NEG    Sample Expiration      09/08/2022,2359 Performed at [facility], [address], [place], [place] [address]   CBC   Collection Time: 09/05/22  8:18 AM  Result Value Ref Range   WBC 5.7 4.0 - 10.5 K/uL   RBC 4.46 3.87 - 5.11 MIL/uL   Hemoglobin 12.3 12.0 - 15.0 g/dL   HCT 60.4 54.0 - 98.1 %   MCV 83.0 80.0 - 100.0 fL   MCH 27.6 26.0 - 34.0 pg   MCHC 33.2 30.0 - 36.0 g/dL   RDW 19.1 47.8 - 29.5 %   Platelets 229 150 - 400 K/uL   nRBC 0.0 0.0 - 0.2 %  Comprehensive metabolic panel   Collection Time: 09/05/22  8:18 AM  Result Value Ref Range   Sodium 135 135 - 145 mmol/L   Potassium 3.5 3.5 - 5.1 mmol/L   Chloride 104 98 - 111 mmol/L   CO2 20 (L) 22 - 32 mmol/L   Glucose, Bld 87 70 - 99 mg/dL   BUN 6 6 -  20 mg/dL   Creatinine, Ser 6.21 0.44 - 1.00 mg/dL   Calcium 8.6 (L) 8.9 - 10.3 mg/dL   Total Protein 6.2 (L) 6.5 - 8.1 g/dL   Albumin 2.7 (L) 3.5 - 5.0 g/dL   AST 17 15 - 41 U/L   ALT 12 0 - 44 U/L   Alkaline Phosphatase 140 (H) 38 - 126 U/L   Total Bilirubin 1.0 0.3 - 1.2 mg/dL   GFR, Estimated >30 >86 mL/min   Anion gap 11 5 - 15    Patient Active Problem List   Diagnosis Date Noted   Hyperthyroidism affecting pregnancy in third trimester 09/05/2022   Gestational hypertension 09/03/2022   Obesity in pregnancy, antepartum 08/17/2022   Supervision of high risk pregnancy in third trimester 04/12/2022   Hyperthyroidism complicating pregnancy 04/12/2022   History of prior pregnancy with IUGR 02/09/2021   Graves disease 07/26/2018   GERD (gastroesophageal reflux disease) 05/31/2018   Hyperthyroidism 04/10/2018   Irritable bowel syndrome with constipation 02/22/2018   Generalized anxiety disorder 02/22/2018   Vitamin D deficiency 02/22/2018    Assessment/Plan:  Debra Hinton is a 27 y.o. [id number] at [redacted]w[redacted]d here for IOL gHTN  #Labor: cytotec 50/19mcg PO/VAG, declines FB at this time. Prefers SROM. Pt will ambulate hallways #Pain: Per pt request #FWB: CAT 1 #ID:  GBS neg #MOF: Breast #MOC: vasectomy planned #gHTN: PreE labs pending. BP well controlled now. CTM  Myrtie Hawk, DO  09/05/2022, 9:17 AM

## 2022-09-05 NOTE — Progress Notes (Signed)
Labor Progress Note  Tatem Klimas is a 27 y.o. [phone] at [redacted]w[redacted]d presented for IOL gHTN  S: PT doing well. HAS BEEN moving around the room.  O:  BP 123/77   Pulse 89   Temp 98.8 F (37.1 C) (Oral)   Resp 18   Ht 5' (1.524 m)   Wt 86 kg   LMP 12/20/2021   SpO2 100%   BMI 37.01 kg/m  EFM: 150 bpm/Moderate variability/ 15x15 accels/ None decels  CVE: Dilation: 1 Effacement (%): 50 Cervical Position: Posterior Station: -3 Exam by:: Michel Harrow, RN   A&P: 27 y.o. [phone] [redacted]w[redacted]d  here for IOL as above  #Labor: Progressing well. S/p cytotec. Cont expectant management #Pain: Family/Friend support and PO/IV pain meds #FWB: CAT 1 #GBS negative #gHTN: PreE labs pending. BP well controlled. CTM  Myrtie Hawk, DO FMOB Fellow, Faculty practice [facility], Center for [facility] 09/05/22  1:07 PM

## 2022-09-06 ENCOUNTER — Encounter (HOSPITAL_COMMUNITY): Payer: Self-pay | Admitting: Obstetrics & Gynecology

## 2022-09-06 DIAGNOSIS — O99344 Other mental disorders complicating childbirth: Secondary | ICD-10-CM

## 2022-09-06 DIAGNOSIS — O99214 Obesity complicating childbirth: Secondary | ICD-10-CM

## 2022-09-06 DIAGNOSIS — Z3A37 37 weeks gestation of pregnancy: Secondary | ICD-10-CM

## 2022-09-06 DIAGNOSIS — O99284 Endocrine, nutritional and metabolic diseases complicating childbirth: Secondary | ICD-10-CM

## 2022-09-06 DIAGNOSIS — O134 Gestational [pregnancy-induced] hypertension without significant proteinuria, complicating childbirth: Secondary | ICD-10-CM

## 2022-09-06 LAB — RPR: RPR Ser Ql: NONREACTIVE

## 2022-09-06 LAB — CBC
HCT: 34.6 % — ABNORMAL LOW (ref 36.0–46.0)
Hemoglobin: 11.7 g/dL — ABNORMAL LOW (ref 12.0–15.0)
MCH: 28 pg (ref 26.0–34.0)
MCHC: 33.8 g/dL (ref 30.0–36.0)
MCV: 82.8 fL (ref 80.0–100.0)
Platelets: 198 10*3/uL (ref 150–400)
RBC: 4.18 MIL/uL (ref 3.87–5.11)
RDW: 13.2 % (ref 11.5–15.5)
WBC: 9.5 10*3/uL (ref 4.0–10.5)
nRBC: 0 % (ref 0.0–0.2)

## 2022-09-06 MED ORDER — SODIUM CHLORIDE 0.9 % IV SOLN: 250.0000 mL | INTRAVENOUS | Status: DC | PRN

## 2022-09-06 MED ORDER — BENZOCAINE-MENTHOL 20-0.5 % EX AERO
1.0000 | INHALATION_SPRAY | CUTANEOUS | Status: DC | PRN
  Filled 2022-09-06: qty 56

## 2022-09-06 MED ORDER — COCONUT OIL OIL: 1.0000 | TOPICAL_OIL | Status: DC | PRN

## 2022-09-06 MED ORDER — ONDANSETRON HCL 4 MG/2ML IJ SOLN: 4.0000 mg | INTRAMUSCULAR | Status: DC | PRN

## 2022-09-06 MED ORDER — FAMOTIDINE IN NACL 20-0.9 MG/50ML-% IV SOLN
20.0000 mg | Freq: Once | INTRAVENOUS | Status: AC | PRN
  Administered 2022-09-06: 20 mg via INTRAVENOUS
  Filled 2022-09-06: qty 50

## 2022-09-06 MED ORDER — WITCH HAZEL-GLYCERIN EX PADS: 1.0000 | MEDICATED_PAD | CUTANEOUS | Status: DC | PRN

## 2022-09-06 MED ORDER — ZOLPIDEM TARTRATE 5 MG PO TABS: 5.0000 mg | ORAL_TABLET | Freq: Every evening | ORAL | Status: DC | PRN

## 2022-09-06 MED ORDER — SIMETHICONE 80 MG PO CHEW: 80.0000 mg | CHEWABLE_TABLET | ORAL | Status: DC | PRN

## 2022-09-06 MED ORDER — SENNOSIDES-DOCUSATE SODIUM 8.6-50 MG PO TABS
2.0000 | ORAL_TABLET | ORAL | Status: DC
  Administered 2022-09-06 – 2022-09-07 (×2): 2 via ORAL
  Filled 2022-09-06: qty 2

## 2022-09-06 MED ORDER — OXYTOCIN-SODIUM CHLORIDE 30-0.9 UT/500ML-% IV SOLN: 1.0000 m[IU]/min | INTRAVENOUS | Status: DC

## 2022-09-06 MED ORDER — DIBUCAINE (PERIANAL) 1 % EX OINT: 1.0000 | TOPICAL_OINTMENT | CUTANEOUS | Status: DC | PRN

## 2022-09-06 MED ORDER — ONDANSETRON HCL 4 MG PO TABS: 4.0000 mg | ORAL_TABLET | ORAL | Status: DC | PRN

## 2022-09-06 MED ORDER — IBUPROFEN 600 MG PO TABS
600.0000 mg | ORAL_TABLET | Freq: Four times a day (QID) | ORAL | Status: DC
  Administered 2022-09-06 – 2022-09-07 (×4): 600 mg via ORAL
  Filled 2022-09-06 (×5): qty 1

## 2022-09-06 MED ORDER — TERBUTALINE SULFATE 1 MG/ML IJ SOLN: 0.2500 mg | Freq: Once | INTRAMUSCULAR | Status: DC | PRN

## 2022-09-06 MED ORDER — DIPHENHYDRAMINE HCL 25 MG PO CAPS: 25.0000 mg | ORAL_CAPSULE | Freq: Four times a day (QID) | ORAL | Status: DC | PRN

## 2022-09-06 MED ORDER — PRENATAL MULTIVITAMIN CH
1.0000 | ORAL_TABLET | Freq: Every day | ORAL | Status: DC
  Administered 2022-09-06 – 2022-09-07 (×2): 1 via ORAL
  Filled 2022-09-06 (×2): qty 1

## 2022-09-06 MED ORDER — SODIUM CHLORIDE 0.9% FLUSH: 3.0000 mL | Freq: Two times a day (BID) | INTRAVENOUS | Status: DC

## 2022-09-06 MED ORDER — FUROSEMIDE 20 MG PO TABS
20.0000 mg | ORAL_TABLET | Freq: Every day | ORAL | Status: DC
  Administered 2022-09-07: 20 mg via ORAL
  Filled 2022-09-06: qty 1

## 2022-09-06 MED ORDER — SODIUM CHLORIDE 0.9% FLUSH: 3.0000 mL | INTRAVENOUS | Status: DC | PRN

## 2022-09-06 MED ORDER — ACETAMINOPHEN 325 MG PO TABS
650.0000 mg | ORAL_TABLET | ORAL | Status: DC | PRN
  Administered 2022-09-06 – 2022-09-07 (×3): 650 mg via ORAL
  Filled 2022-09-06 (×3): qty 2

## 2022-09-06 NOTE — Progress Notes (Addendum)
Labor Progress Note  Debra Hinton is a 27 y.o. [phone] at [redacted]w[redacted]d presented for IOL gHTN  S: No concerns at this time  O:  BP 111/70   Pulse 62   Temp 98.6 F (37 C) (Oral)   Resp 16   Ht 5' (1.524 m)   Wt 86 kg   LMP 12/20/2021   SpO2 99%   BMI 37.01 kg/m  EFM: 135 bpm/Mild variability/ 15x15 accels/ None decels  CVE: Dilation: 6 Effacement (%): 90 Cervical Position: Posterior Station: -2 Presentation: Vertex Exam by:: Debra Hinton----------------------------   A&P: 27 y.o. [name] [redacted]w[redacted]d  here for IOL as above  #Labor: Progressing well. The patient prefers to SROM.  #Pain: Family/Friend support and PO/IV pain meds, epidural  #FWB: CAT 1 #GBS negative #gHTN: PreE labs negative. BP well controlled. CTM  Debra Hinton PGY-3 09/06/22  4:55 AM  GME ATTESTATION:  I saw and evaluated the patient. I agree with the findings and the plan of care as documented in the resident's note. I have made changes to documentation as necessary. Consider starting pitocin.   Lavonda Jumbo, [place] OB Fellow, Faculty [facility], Center for [facility] 09/06/2022, 5:13 AM

## 2022-09-06 NOTE — Lactation Note (Signed)
This note was copied from a baby's chart. Lactation Consultation Note  Patient Name: Debra Hinton [id number] Date: 09/06/2022 Age:27 hours   Parents sleeping. Lactation to follow up.     Hardie Pulley  RN IBCLC 09/06/2022, 2:29 PM

## 2022-09-06 NOTE — Discharge Summary (Signed)
Postpartum Discharge Summary  Date of Service updated***     Patient Name: Debra Hinton [facility] DOB: Apr 05, 1996 MRN: [id number]  Date of admission: 09/05/2022 Delivery date:09/06/2022  Delivering provider: PRAY, Claris Che E  Date of discharge: 09/06/2022  Admitting diagnosis: Hyperthyroidism affecting pregnancy in third trimester [O99.283, E05.90] Intrauterine pregnancy: [redacted]w[redacted]d     Secondary diagnosis:  Principal Problem:   Hyperthyroidism affecting pregnancy in third trimester Active Problems:   Generalized anxiety disorder   Supervision of high risk pregnancy in third trimester   Obesity in pregnancy, antepartum   Gestational hypertension  Additional problems: As above    Discharge diagnosis: Term Pregnancy Delivered, Gestational Hypertension, and Hyperthyroidism                                               Post partum procedures:*** Augmentation: Pitocin and Cytotec Complications: None  Hospital course: Induction of Labor With Vaginal Delivery   27 y.o. yo [phone] at [redacted]w[redacted]d was admitted to the hospital 09/05/2022 for induction of labor.  Indication for induction: Gestational hypertension.  Patient had an labor course complicated by induction with cytotec and pitocin, SROM and then uncomplicated vaginal delivery. Membrane Rupture Time/Date: 7:45 AM ,09/06/2022   Delivery Method:Vaginal, Spontaneous  Episiotomy: None  Lacerations:  1st degree  Details of delivery can be found in separate delivery note.  Patient had a postpartum course complicated by***. Patient is discharged home 09/06/22.  Newborn Data: Birth date:09/06/2022  Birth time:9:55 AM  Gender:Female  Living status:Living  Apgars:8 ,9  Weight:   Magnesium Sulfate received: {Mag received:30440022} BMZ received: No Rhophylac:N/A MMR:N/A T-DaP:Given prenatally Flu: N/A Transfusion:{Transfusion received:30440034}  Physical exam  Vitals:   09/06/22 0758 09/06/22 0830 09/06/22 0910 09/06/22 1000  BP: 107/70  137/88 137/77 (!) 116/93  Pulse: 87 87 80 (!) 108  Resp:      Temp:      TempSrc:      SpO2:  100% 99%   Weight:      Height:       General: {Exam; general:21111117} Lochia: {Desc; appropriate/inappropriate:30686::"appropriate"} Uterine Fundus: {Desc; firm/soft:30687} Incision: {Exam; incision:21111123} DVT Evaluation: {Exam; dvt:2111122} Labs: Lab Results  Component Value Date   WBC 8.0 09/05/2022   HGB 11.6 (L) 09/05/2022   HCT 33.5 (L) 09/05/2022   MCV 81.5 09/05/2022   PLT 201 09/05/2022      Latest Ref Rng & Units 09/05/2022    8:18 AM  CMP  Glucose 70 - 99 mg/dL 87   BUN 6 - 20 mg/dL 6   Creatinine 6.96 - 2.95 mg/dL 2.84   Sodium 132 - 440 mmol/L 135   Potassium 3.5 - 5.1 mmol/L 3.5   Chloride 98 - 111 mmol/L 104   CO2 22 - 32 mmol/L 20   Calcium 8.9 - 10.3 mg/dL 8.6   Total Protein 6.5 - 8.1 g/dL 6.2   Total Bilirubin 0.3 - 1.2 mg/dL 1.0   Alkaline Phos 38 - 126 U/L 140   AST 15 - 41 U/L 17   ALT 0 - 44 U/L 12    Edinburgh Score:    06/09/2021    1:55 PM  Edinburgh Postnatal Depression Scale Screening Tool  I have been able to laugh and see the funny side of things. 0  I have looked forward with enjoyment to things. 0  I have blamed myself unnecessarily when  things went wrong. 1  I have been anxious or worried for no good reason. 0  I have felt scared or panicky for no good reason. 0  Things have been getting on top of me. 1  I have been so unhappy that I have had difficulty sleeping. 0  I have felt sad or miserable. 0  I have been so unhappy that I have been crying. 0  The thought of harming myself has occurred to me. 0  Edinburgh Postnatal Depression Scale Total 2     After visit meds:  Allergies as of 09/06/2022       Reactions   Ptu [propylthiouracil] Other (See Comments)   Chemical taste     Med Rec must be completed prior to using this [facility]***        Discharge home in stable condition Infant Feeding: Breast Infant  Disposition:home with mother Discharge instruction: per After Visit Summary and Postpartum booklet. Activity: Advance as tolerated. Pelvic rest for 6 weeks.  Diet: routine diet Future Appointments: Future Appointments  Date Time Provider Department Center  10/25/2022  3:00 PM Shamleffer, Konrad Dolores, MD [facility] None   Follow up Visit:  Follow-up Information     [facility] for [facility] Follow up.   Specialty: Obstetrics and Gynecology Contact information: [address] [place] [address] [phone]                 Please schedule this patient for a In person postpartum visit in 6 with the following provider: Any provider. Additional Postpartum F/U:BP check 1 week  High risk pregnancy complicated by: HTN gestational Delivery mode:  Vaginal, Spontaneous  Anticipated Birth Control:   vasectomy  Message sent to schedulers on 09/06/22 by Dr Miquel Dunn.   09/06/2022 Billey Co, MD

## 2022-09-06 NOTE — Anesthesia Postprocedure Evaluation (Signed)
Anesthesia Post Note  Patient: Debra Hinton  Procedure(s) Performed: AN AD HOC LABOR EPIDURAL     Patient location during evaluation: Mother Baby Anesthesia Type: Epidural Level of consciousness: awake and alert Pain management: pain level controlled Vital Signs Assessment: post-procedure vital signs reviewed and stable Respiratory status: spontaneous breathing, nonlabored ventilation and respiratory function stable Cardiovascular status: stable Postop Assessment: no headache, no backache, no apparent nausea or vomiting, able to ambulate and adequate PO intake Anesthetic complications: no   No notable events documented.  Last Vitals:  Vitals:   09/06/22 1353 09/06/22 1847  BP: 115/79 117/75  Pulse: 78 72  Resp: 16 16  Temp: 36.9 C 36.6 C  SpO2: 100% 100%    Last Pain:  Vitals:   09/06/22 1847  TempSrc: Oral  PainSc: 4    Pain Goal:                Epidural/Spinal Function Cutaneous sensation: Normal sensation (09/06/22 1847), Patient able to flex knees: Yes (09/06/22 1847), Patient able to lift hips off bed: Yes (09/06/22 1847), Back pain beyond tenderness at insertion site: No (09/06/22 1847), Progressively worsening motor and/or sensory loss: No (09/06/22 1847), Bowel and/or bladder incontinence post epidural: No (09/06/22 1847)  Blythe Stanford

## 2022-09-06 NOTE — Progress Notes (Signed)
Labor Progress Note  Debra Hinton is a 27 y.o. [phone] at [redacted]w[redacted]d presented for IOL gHTN  S: No concerns at this time  O:  BP 102/65   Pulse 72   Temp 98.6 F (37 C) (Oral)   Resp 16   Ht 5' (1.524 m)   Wt 86 kg   LMP 12/20/2021   SpO2 99%   BMI 37.01 kg/m  EFM: 145 bpm/Mild variability/ 15x15 accels/ Occasional variable decels  CVE: Dilation: 6 Effacement (%): 90 Cervical Position: Posterior Station: -2 Presentation: Vertex Exam by:: [name]   A&P: 27 y.o. [id number] [redacted]w[redacted]d  here for IOL as above  #Labor: Unchanged. Will start pitocin at this time.  The patient prefers to SROM.  #Pain: Family/Friend support and PO/IV pain meds, epidural  #FWB: CAT II but overall reassuring #GBS negative #gHTN: PreE labs negative. BP well controlled. CTM   Lavonda Jumbo, DO OB Fellow, Faculty [facility], Center for [facility] 09/06/2022, 6:35 AM

## 2022-09-07 ENCOUNTER — Encounter: Payer: Medicaid Other | Admitting: Obstetrics & Gynecology

## 2022-09-07 ENCOUNTER — Other Ambulatory Visit: Payer: Medicaid Other

## 2022-09-07 ENCOUNTER — Other Ambulatory Visit (HOSPITAL_COMMUNITY): Payer: Self-pay

## 2022-09-07 LAB — CBC
HCT: 29 % — ABNORMAL LOW (ref 36.0–46.0)
Hemoglobin: 9.9 g/dL — ABNORMAL LOW (ref 12.0–15.0)
MCH: 28.4 pg (ref 26.0–34.0)
MCHC: 34.1 g/dL (ref 30.0–36.0)
MCV: 83.3 fL (ref 80.0–100.0)
Platelets: 158 10*3/uL (ref 150–400)
RBC: 3.48 MIL/uL — ABNORMAL LOW (ref 3.87–5.11)
RDW: 13.2 % (ref 11.5–15.5)
WBC: 7.1 10*3/uL (ref 4.0–10.5)
nRBC: 0 % (ref 0.0–0.2)

## 2022-09-07 LAB — BIRTH TISSUE RECOVERY COLLECTION (PLACENTA DONATION)

## 2022-09-07 MED ORDER — IBUPROFEN 600 MG PO TABS
600.0000 mg | ORAL_TABLET | Freq: Four times a day (QID) | ORAL | 0 refills | Status: AC
  Filled 2022-09-07: qty 30, 8d supply, fill #0

## 2022-09-07 MED ORDER — SENNOSIDES-DOCUSATE SODIUM 8.6-50 MG PO TABS
2.0000 | ORAL_TABLET | ORAL | 0 refills | Status: DC
  Filled 2022-09-07: qty 30, 15d supply, fill #0

## 2022-09-07 MED ORDER — ACETAMINOPHEN 325 MG PO TABS
650.0000 mg | ORAL_TABLET | ORAL | 0 refills | Status: DC | PRN
  Filled 2022-09-07: qty 30, 3d supply, fill #0

## 2022-09-07 MED ORDER — FUROSEMIDE 20 MG PO TABS
20.0000 mg | ORAL_TABLET | Freq: Every day | ORAL | 0 refills | Status: DC
  Filled 2022-09-07: qty 4, 4d supply, fill #0

## 2022-09-07 MED ORDER — BENZOCAINE-MENTHOL 20-0.5 % EX AERO
1.0000 | INHALATION_SPRAY | CUTANEOUS | 0 refills | Status: DC | PRN
  Filled 2022-09-07: qty 78, fill #0

## 2022-09-07 NOTE — Lactation Note (Signed)
This note was copied from a baby's chart. Lactation Consultation Note  Patient Name: Debra Hinton [id number] Date: 09/07/2022 Age:27 hours  Reason for consult: Initial assessment;Early term 37-38.6wks  P4, GA [redacted]w[redacted]d, 4% weight loss  Mother states she has breastfeeding experience and denies any concerns or questions with breastfeeding. Baby recently fed at breast per mother. Infant was alert, rooting and fist in her mouth. Declined need for assist. Discussed feeding with cues, 8-12 times in 24 hours, especially due to baby being born at 37.1 weeks. Mother is hopeful for a discharge for her and baby today. Mother states she will use a "nipple guard" (nipple shield) to pull her nipple out and removes for baby to latch.  Mother given handout with information of O/P services, breastfeeding support groups, and our phone # for post-discharge questions.     Maternal Data Has patient been taught Hand Expression?: Yes Does the patient have breastfeeding experience prior to this delivery?: Yes How long did the patient breastfeed?: 3-14 months  Feeding Mother's Current Feeding Choice: Breast Milk  LATCH Score  Unable to observe, mother declined assistance    Lactation Tools Discussed/Used    Interventions Interventions: Education;LC Services brochure  Discharge Pump: DEBP;Personal  Consult Status Consult Status: Complete    Omar Person 09/07/2022, 12:32 PM

## 2022-09-07 NOTE — Progress Notes (Addendum)
MOB was referred for history of anxiety. * Referral screened out by Clinical Social Worker because none of the following criteria appear to apply:  ~ History of anxiety during this pregnancy, or of post-partum depression following prior delivery.  ~ Diagnosis of anxiety within last 3 years.  Per OB notes, MOB did not indicate any anxiety signs/symptoms during pregnancy.  OR  * MOB's symptoms currently being treated with medication and/or therapy.  Please contact the Clinical Social Worker if needs arise, by MOB request, or if MOB scores greater than 9/yes to question 10 on Edinburgh Postpartum Depression Screen.  Navarro Nine, LCSWA Clinical Social Worker [phone] 

## 2022-09-09 ENCOUNTER — Telehealth: Payer: Self-pay

## 2022-09-09 NOTE — Telephone Encounter (Signed)
PP nurse reached out to schedule home visit, patient declined visit. Stating fourth time going through process and does not need anything at this time. Patient also has appointments in place for newborns weight check and her blood pressure check. RN signed off with requesting if patient changes her mind to give a call back.

## 2022-09-13 ENCOUNTER — Ambulatory Visit (INDEPENDENT_AMBULATORY_CARE_PROVIDER_SITE_OTHER): Payer: Medicaid Other

## 2022-09-13 ENCOUNTER — Other Ambulatory Visit: Payer: Medicaid Other

## 2022-09-13 VITALS — BP 125/84 | HR 82

## 2022-09-13 DIAGNOSIS — O169 Unspecified maternal hypertension, unspecified trimester: Secondary | ICD-10-CM

## 2022-09-13 NOTE — Progress Notes (Signed)
Subjective:  Debra Hinton is a 27 y.o. female here for BP check.   Hypertension ROS: Patient denies any headaches, visual symptoms, RUQ/epigastric pain or other concerning symptoms.  Objective:  There were no vitals taken for this visit.  Appearance alert, well appearing, and in no distress. General exam BP noted to be stable today in office.    Assessment:   Blood Pressure well controlled.   Plan:  Keep PP appt  Discussed post partum precautions and to contact the office with any concerns.

## 2022-09-14 ENCOUNTER — Encounter: Payer: Medicaid Other | Admitting: Obstetrics and Gynecology

## 2022-09-17 ENCOUNTER — Inpatient Hospital Stay (HOSPITAL_COMMUNITY): Payer: Medicaid Other

## 2022-09-21 ENCOUNTER — Encounter: Payer: Medicaid Other | Admitting: Obstetrics & Gynecology

## 2022-09-28 ENCOUNTER — Ambulatory Visit: Payer: Medicaid Other | Admitting: Obstetrics & Gynecology

## 2022-10-19 ENCOUNTER — Ambulatory Visit: Payer: Medicaid Other | Admitting: Obstetrics & Gynecology

## 2022-10-25 ENCOUNTER — Ambulatory Visit: Payer: Medicaid Other | Admitting: Internal Medicine

## 2022-10-25 NOTE — Progress Notes (Deleted)
Name: Debra Hinton [facility]  MRN/ DOB: [id number], 10-Feb-1996    Age/ Sex: 27 y.o., female     PCP: Carlean Jews, NP   Reason for Endocrinology Evaluation: Hyperthyroidism     Initial Endocrinology Clinic Visit:  04/10/2018    PATIENT IDENTIFIER: Debra Hinton is a 27 y.o., female with a past medical history of IBS, GAD, and Graves' disease. She has followed with [facility] since 04/10/2018 for consultative assistance with management of her Hyperthyroidism .   HISTORICAL SUMMARY:  [name], 2019 with c/o constipation alternating with diarrhea as well as anxiety and weight gain. Her Labs revealed a suppressed  TSH at < 0.006 uIU/mL and elevated FT4 and T3. A thyroid ultrasound was unrevealing.    On her initial visit to our office in November, 2020 her TSH continued to be low but with normalization of her FT4. We decided to withhold therapy due to improvement in her TFT's    By 2020 she became pregnant and was started on PTU but was lost to follow-up until her return to our office in 10/2020 when she was [redacted] weeks pregnant at the time with a suppressed TSH 0.006 uIU/mL     She was lost to follow-up by November 2022 when she was 36.6 weeks of gestation the time until her return to our clinic in October 2023 when she was in her first trimester   She is S/P vaginal delivery 04/2021  She was lost to follow-up until her return 02/2022 with fourth pregnancy at 13 weeks of gestation  She was started on methimazole during her second trimester 03/2022  PTU causes metallic taste   FH with thyroid disease. SUBJECTIVE:     Today (10/25/2022):  Debra Hinton is here for a follow-up on Graves' disease .  She is currently at 24.4 weeks of gestation .  She ended up in the hospital for atypical chest pain, was diagnosed with aspiration pneumonia due to GERD Denies palpitations  She denies diarrhea /loose  stools Denies tremors  Denies nausea, or vomiting   EDD 5/5th, 2024  PTU caused chemical taste   Methimazole 5 mg daily    HISTORY:  Past Medical History:  Past Medical History:  Diagnosis Date   Anxiety    Anxiety and depression    Depressive disorder    GERD (gastroesophageal reflux disease)    Gestational hypertension, third trimester    Hyperthyroidism 2019   Possible Graves disease   IBS (irritable bowel syndrome)    Irritable bowel syndrome (IBS)    Thyroid disease    Past Surgical History:  Past Surgical History:  Procedure Laterality Date   NO PAST SURGERIES     Social History:  reports that she quit smoking about 4 years ago. Her smoking use included cigarettes. She has never used smokeless tobacco. She reports that she does not currently use alcohol. She reports that she does not use drugs. Family History:  Family History  Problem Relation Age of Onset   Breast cancer Other         mggm   Hypertension Mother    Hyperlipidemia Father    Thyroid disease Maternal Grandmother    Dementia Maternal Grandmother    Heart attack Maternal Grandfather    Diabetes Paternal Grandmother    Alcoholism Paternal Grandmother    Cancer Paternal Grandfather        pancreatic   Healthy Brother  Hypothyroidism Maternal Aunt    Hypothyroidism Maternal Aunt    Breast cancer Maternal Aunt        30s     HOME MEDICATIONS: Allergies as of 10/25/2022       Reactions   Ptu [propylthiouracil] Other (See Comments)   Chemical taste        Medication List        Accurate as of October 25, 2022 12:26 PM. If you have any questions, ask your nurse or doctor.          acetaminophen 325 MG tablet Commonly known as: Tylenol Take 2 tablets (650 mg total) by mouth every 4 (four) hours as needed (for pain scale < 4).   benzocaine-Menthol 20-0.5 % Aero Commonly known as: DERMOPLAST Apply 1 Application topically as needed for irritation (perineal discomfort).   furosemide  20 MG tablet Commonly known as: LASIX Take 1 tablet (20 mg total) by mouth daily for 4 days.   ibuprofen 600 MG tablet Commonly known as: ADVIL Take 1 tablet (600 mg total) by mouth every 6 (six) hours.   methimazole 5 MG tablet Commonly known as: TAPAZOLE Take 1 tablet (5 mg total) by mouth daily.   Senexon-S 8.6-50 MG tablet Generic drug: senna-docusate Take 2 tablets by mouth daily.          OBJECTIVE:   PHYSICAL EXAM: VS: There were no vitals taken for this visit.   EXAM: General: Pt appears well and is in NAD  Neck: General: Supple without adenopathy. Thyroid: Thyroid size normal.  No goiter or nodules appreciated.   Lungs: Clear with good BS bilat with no rales, rhonchi, or wheezes  Heart: Auscultation: RRR.  Abdomen: Gravid Uterus   Extremities:  BL LE: No pretibial edema normal ROM and strength.  Mental Status: Judgment, insight: Intact Memory: Intact for recent and remote events Mood and affect: No depression, anxiety, or agitation     DATA REVIEWED:   Latest Reference Range & Units 06/10/22 12:48  TSH 0.35 - 5.50 uIU/mL 0.02 (L)  Triiodothyronine (T3) 76 - 181 ng/dL 161 (H)  Thyroxine (T4) 5.1 - 11.9 mcg/dL 09.6 (H)      Results for Debra Hinton, Debra Hinton (MRN [id number]) as of 12/22/2020 15:20  Ref. Range 04/10/2018 09:43  Thyrotropin Receptor Ab Latest Ref Range: <=16.0 % 30.4 (H)      ASSESSMENT / PLAN / RECOMMENDATIONS:   Hyperthyroidism During First Trimester:   - She is clinically euthyroid  - No local neck symptoms  -She has been on methimazole since November 2023, her TFTs are trending down, no changes are needed at this time - The goal of treatment is to maintain persistent but mild hyperthyroidism in the mother in an attempt to prevent fetal hypothyroidism since the fetal thyroid is more sensitive to the action of thionamide therapy. Overtreatment of maternal hyperthyroidism can cause fetal goiter and primary hypothyroidism.  - PTU  caused metallic taste int he past   Medication Continue methimazole 5 mg daily    Signed electronically by: Lyndle Herrlich, MD  [facility]  [facility] [address] [place], [place] [address] Phone: [phone] FAX: [phone]      CC: Carlean Jews, NP [address] [place] [address] Phone: [phone]  Fax: [phone]   Return to Endocrinology clinic as below: Future Appointments  Date Time Provider Department Center  10/25/2022  3:00 PM Keshona Kartes, Konrad Dolores, MD [facility] None  10/28/2022  2:30  PM Reva Bores, MD [facility]

## 2022-10-28 ENCOUNTER — Ambulatory Visit (INDEPENDENT_AMBULATORY_CARE_PROVIDER_SITE_OTHER): Payer: Medicaid Other | Admitting: Family Medicine

## 2022-10-28 ENCOUNTER — Encounter: Payer: Self-pay | Admitting: Family Medicine

## 2022-10-28 NOTE — Progress Notes (Signed)
Post Partum Visit Note  Debra Hinton is a 27 y.o. [phone] female who presents for a postpartum visit. She is 7 weeks postpartum following a normal spontaneous vaginal delivery.  I have fully reviewed the prenatal and intrapartum course. The delivery was at 37.1 gestational weeks.  Anesthesia: epidural. Postpartum course has been uncomplicated. Baby is doing well. Baby is feeding by breast. Bleeding staining only. Bowel function is normal. Bladder function is normal. Patient is not sexually active. Contraception method is condoms. Postpartum depression screening: negative.   The pregnancy intention screening data noted above was reviewed. Potential methods of contraception were discussed. The patient elected to proceed with No data recorded.   Edinburgh Postnatal Depression Scale - 10/28/22 1447       Edinburgh Postnatal Depression Scale:  In the Past 7 Days   I have been able to laugh and see the funny side of things. 0    I have looked forward with enjoyment to things. 0    I have blamed myself unnecessarily when things went wrong. 0    I have been anxious or worried for no good reason. 0    I have felt scared or panicky for no good reason. 0    Things have been getting on top of me. 0    I have been so unhappy that I have had difficulty sleeping. 0    I have felt sad or miserable. 0    I have been so unhappy that I have been crying. 0    The thought of harming myself has occurred to me. 0    Edinburgh Postnatal Depression Scale Total 0             Health Maintenance Due  Topic Date Due   COVID-19 Vaccine (1) Never done    The following portions of the patient's history were reviewed and updated as appropriate: allergies, current medications, past family history, past medical history, past social history, past surgical history, and problem list.  Review of Systems Pertinent items noted in HPI and remainder of comprehensive ROS otherwise negative.  Objective:  BP  119/78   Pulse (!) 114   Wt 186 lb (84.4 kg)   Breastfeeding Yes   BMI 36.33 kg/m    General:  alert, cooperative, and appears stated age   Breasts:  not indicated  Lungs: Normal effort  Heart:  regular rate and rhythm  Abdomen: soft, non-tender; bowel sounds normal; no masses,  no organomegaly   GU exam:  not indicated       Assessment:   Normal postpartum exam.   Plan:   Essential components of care per ACOG recommendations:  1.  Mood and well being: Patient with negative depression screening today. Reviewed local resources for support.  - Patient tobacco use? No.   - hx of drug use? No.    2. Infant care and feeding:  -Patient currently breastmilk feeding? Yes. Reviewed importance of draining breast regularly to support lactation.  -Social determinants of health (SDOH) reviewed in EPIC.   3. Sexuality, contraception and birth spacing - Patient does not want a pregnancy in the next year.  Desired family size is 5 children.  - Reviewed reproductive life planning. Reviewed contraceptive methods based on pt preferences and effectiveness.  Patient desired IUD or IUS today.   - Discussed birth spacing of 18 months  4. Sleep and fatigue -Encouraged family/partner/community support of 4 hrs of uninterrupted sleep to help with mood and fatigue  5. Physical Recovery  - Discussed patients delivery and complications. She describes her labor as good. - Patient had a Vaginal, no problems at delivery. Patient had a 1st degree laceration. Perineal healing reviewed. Patient expressed understanding - Patient has urinary incontinence? Yes. Discussed role of pelvic floor PT. - Patient is safe to resume physical and sexual activity  6.  Health Maintenance - HM due items addressed Yes - Last pap smear  Diagnosis  Date Value Ref Range Status  06/09/2021   Final   - Negative for intraepithelial lesion or malignancy (NILM)   Pap smear not done at today's visit.  -Breast Cancer screening  indicated? No.   7. Chronic Disease/Pregnancy Condition follow up: Hypertension, Graves disease  - PCP follow up  Reva Bores, MD Center for [facility], [facility]

## 2022-11-03 ENCOUNTER — Encounter: Payer: Self-pay | Admitting: Internal Medicine

## 2022-11-03 ENCOUNTER — Ambulatory Visit (INDEPENDENT_AMBULATORY_CARE_PROVIDER_SITE_OTHER): Payer: Medicaid Other | Admitting: Internal Medicine

## 2022-11-03 VITALS — BP 120/70 | HR 90 | Ht 60.0 in | Wt 188.0 lb

## 2022-11-03 DIAGNOSIS — E0500 Thyrotoxicosis with diffuse goiter without thyrotoxic crisis or storm: Secondary | ICD-10-CM | POA: Diagnosis not present

## 2022-11-03 DIAGNOSIS — E0590 Thyrotoxicosis, unspecified without thyrotoxic crisis or storm: Secondary | ICD-10-CM | POA: Diagnosis not present

## 2022-11-03 LAB — T4, FREE: Free T4: 1.68 ng/dL — ABNORMAL HIGH (ref 0.60–1.60)

## 2022-11-03 LAB — TSH: TSH: 0 u[IU]/mL — ABNORMAL LOW (ref 0.35–5.50)

## 2022-11-03 NOTE — Progress Notes (Signed)
Name: Debra Hinton [facility]  MRN/ DOB: [id number], 1995/06/08    Age/ Sex: 27 y.o., female     PCP: Carlean Jews, NP   Reason for Endocrinology Evaluation: Hyperthyroidism     Initial Endocrinology Clinic Visit:  04/10/2018    PATIENT IDENTIFIER: Debra Hinton is a 27 y.o., female with a past medical history of IBS, GAD, and Graves' disease. She has followed with [facility] since 04/10/2018 for consultative assistance with management of her Hyperthyroidism .   HISTORICAL SUMMARY:  [name], 2019 with c/o constipation alternating with diarrhea as well as anxiety and weight gain. Her Labs revealed a suppressed  TSH at < 0.006 uIU/mL and elevated FT4 and T3. A thyroid ultrasound was unrevealing.    On her initial visit to our office in November, 2020 her TSH continued to be low but with normalization of her FT4. We decided to withhold therapy due to improvement in her TFT's    By 2020 she became pregnant and was started on PTU but was lost to follow-up until her return to our office in 10/2020 when she was [redacted] weeks pregnant at the time with a suppressed TSH 0.006 uIU/mL     She was lost to follow-up by November 2022 when she was 36.6 weeks of gestation the time until her return to our clinic in October 2023 when she was in her first trimester   She is S/P vaginal delivery 04/2021  She was lost to follow-up until her return 02/2022 with fourth pregnancy at 13 weeks of gestation  She was started on methimazole during her second trimester 03/2022  She is s/p vaginal delivery 09/05/2022  PTU causes metallic taste   FH with thyroid disease. SUBJECTIVE:     Today (11/03/2022):  Debra Hinton is here for a follow-up on Graves' disease .   She is s/p vaginal delivery 09/05/2022  PTU caused chemical taste   Methimazole 5 mg daily    HISTORY:  Past Medical History:  Past Medical History:   Diagnosis Date   Anxiety    Anxiety and depression    Depressive disorder    GERD (gastroesophageal reflux disease)    Gestational hypertension, third trimester    Hyperthyroidism 2019   Possible Graves disease   IBS (irritable bowel syndrome)    Irritable bowel syndrome (IBS)    Thyroid disease    Past Surgical History:  Past Surgical History:  Procedure Laterality Date   NO PAST SURGERIES     Social History:  reports that she quit smoking about 4 years ago. Her smoking use included cigarettes. She has never used smokeless tobacco. She reports that she does not currently use alcohol. She reports that she does not use drugs. Family History:  Family History  Problem Relation Age of Onset   Breast cancer Other         mggm   Hypertension Mother    Hyperlipidemia Father    Thyroid disease Maternal Grandmother    Dementia Maternal Grandmother    Heart attack Maternal Grandfather    Diabetes Paternal Grandmother    Alcoholism Paternal Grandmother    Cancer Paternal Grandfather        pancreatic   Healthy Brother    Hypothyroidism Maternal Aunt    Hypothyroidism Maternal Aunt    Breast cancer Maternal Aunt        30s     HOME MEDICATIONS: Allergies  as of 11/03/2022       Reactions   Ptu [propylthiouracil] Other (See Comments)   Chemical taste        Medication List        Accurate as of November 03, 2022  7:33 AM. If you have any questions, ask your nurse or doctor.          acetaminophen 325 MG tablet Commonly known as: Tylenol Take 2 tablets (650 mg total) by mouth every 4 (four) hours as needed (for pain scale < 4).   ibuprofen 600 MG tablet Commonly known as: ADVIL Take 1 tablet (600 mg total) by mouth every 6 (six) hours.   methimazole 5 MG tablet Commonly known as: TAPAZOLE Take 1 tablet (5 mg total) by mouth daily.          OBJECTIVE:   PHYSICAL EXAM: VS: There were no vitals taken for this visit.   EXAM: General: Pt appears well and  is in NAD  Neck: General: Supple without adenopathy. Thyroid: Thyroid size normal.  No goiter or nodules appreciated.   Lungs: Clear with good BS bilat with no rales, rhonchi, or wheezes  Heart: Auscultation: RRR.  Abdomen: Gravid Uterus   Extremities:  BL LE: No pretibial edema normal ROM and strength.  Mental Status: Judgment, insight: Intact Memory: Intact for recent and remote events Mood and affect: No depression, anxiety, or agitation     DATA REVIEWED:  Latest Reference Range & Units 11/03/22 13:05  TSH 0.35 - 5.50 uIU/mL 0.00 Repeated and verified X2. (L)  T4,Free(Direct) 0.60 - 1.60 ng/dL 4.09 (H)      Results for UMI, MAINOR (MRN [id number]) as of 12/22/2020 15:20  Ref. Range 04/10/2018 09:43  Thyrotropin Receptor Ab Latest Ref Range: <=16.0 % 30.4 (H)      ASSESSMENT / PLAN / RECOMMENDATIONS:   Hyperthyroidism :   - She is clinically euthyroid  - No local neck symptoms  -She has been on methimazole since November 2023, she needed 1 tablet a day throughout the pregnancy, -Her TFTs today show worsening, will increase methimazole as below - PTU caused metallic taste int he past -Repeat TFTs in 2 months  Medication Increase methimazole 5 mg, 2 tabs daily  Follow-up 6 months Labs in 2 months  Signed electronically by: Lyndle Herrlich, MD  [facility] Endocrinology  [facility] [address] [place], [place] [address] Phone: [phone] FAX: [phone]      CC: Carlean Jews, NP [address] [place] [address] Phone: [phone]  Fax: [phone]   Return to Endocrinology clinic as below: Future Appointments  Date Time Provider Department Center  11/03/2022 12:10 PM Kaysa Roulhac, Konrad Dolores, MD [facility] None

## 2022-11-04 MED ORDER — METHIMAZOLE 5 MG PO TABS: 10.0000 mg | ORAL_TABLET | Freq: Every day | ORAL | 2 refills | Status: DC

## 2023-05-11 ENCOUNTER — Ambulatory Visit: Payer: Medicaid Other | Admitting: Internal Medicine

## 2023-05-11 ENCOUNTER — Encounter: Payer: Self-pay | Admitting: Internal Medicine

## 2023-05-11 VITALS — BP 124/76 | HR 88 | Ht 60.0 in | Wt 178.0 lb

## 2023-05-11 DIAGNOSIS — E0500 Thyrotoxicosis with diffuse goiter without thyrotoxic crisis or storm: Secondary | ICD-10-CM | POA: Diagnosis not present

## 2023-05-11 DIAGNOSIS — E0590 Thyrotoxicosis, unspecified without thyrotoxic crisis or storm: Secondary | ICD-10-CM

## 2023-05-11 DIAGNOSIS — E669 Obesity, unspecified: Secondary | ICD-10-CM | POA: Diagnosis not present

## 2023-05-11 NOTE — Progress Notes (Unsigned)
Name: Debra Hinton  MRN/ DOB: [id number], 1995-07-09    Age/ Sex: 27 y.o., female     PCP: Carlean Jews, NP   Reason for Endocrinology Evaluation: Hyperthyroidism     Initial Endocrinology Clinic Visit:  04/10/2018    PATIENT IDENTIFIER: Debra Hinton is a 27 y.o., female with a past medical history of IBS, GAD, and Graves' disease. She has followed with [facility] since 04/10/2018 for consultative assistance with management of her Hyperthyroidism .   HISTORICAL SUMMARY:  [name], 2019 with c/o constipation alternating with diarrhea as well as anxiety and weight gain. Her Labs revealed a suppressed  TSH at < 0.006 uIU/mL and elevated FT4 and T3. A thyroid ultrasound was unrevealing.    On her initial visit to our office in November, 2020 her TSH continued to be low but with normalization of her FT4. We decided to withhold therapy due to improvement in her TFT's    By 2020 she became pregnant and was started on PTU but was lost to follow-up until her return to our office in 10/2020 when she was [redacted] weeks pregnant at the time with a suppressed TSH 0.006 uIU/mL     She was lost to follow-up by November 2022 when she was 36.6 weeks of gestation the time until her return to our clinic in October 2023 when she was in her first trimester   She is S/P vaginal delivery 04/2021  She was lost to follow-up until her return 02/2022 with fourth pregnancy at 13 weeks of gestation  She was started on methimazole during her second trimester 03/2022  She is s/p vaginal delivery 09/05/2022  PTU causes metallic taste   FH with thyroid disease. SUBJECTIVE:     Today (05/11/2023):  Debra Hinton is here for a follow-up on Graves' disease .  Denies local neck swelling  Denies palpitation  Denies constipation or diarrhea  Patient is complaining of difficulty losing weight despite diet and  exercise She was recently diagnosed with anxiety and ADHD and is taking medications which has helped with methimazole compliance   PTU caused chemical taste   Methimazole 5 mg, 2 tabs  daily    HISTORY:  Past Medical History:  Past Medical History:  Diagnosis Date   Anxiety    Anxiety and depression    Depressive disorder    GERD (gastroesophageal reflux disease)    Gestational hypertension, third trimester    Hyperthyroidism 2019   Possible Graves disease   IBS (irritable bowel syndrome)    Irritable bowel syndrome (IBS)    Thyroid disease    Past Surgical History:  Past Surgical History:  Procedure Laterality Date   NO PAST SURGERIES     Social History:  reports that she quit smoking about 4 years ago. Her smoking use included cigarettes. She has never used smokeless tobacco. She reports that she does not currently use alcohol. She reports that she does not use drugs. Family History:  Family History  Problem Relation Age of Onset   Breast cancer Other         mggm   Hypertension Mother    Hyperlipidemia Father    Thyroid disease Maternal Grandmother    Dementia Maternal Grandmother    Heart attack Maternal Grandfather    Diabetes Paternal Grandmother    Alcoholism Paternal Grandmother    Cancer Paternal Grandfather  pancreatic   Healthy Brother    Hypothyroidism Maternal Aunt    Hypothyroidism Maternal Aunt    Breast cancer Maternal Aunt        30s     HOME MEDICATIONS: Allergies as of 05/11/2023       Reactions   Ptu [propylthiouracil] Other (See Comments)   Chemical taste        Medication List        Accurate as of May 11, 2023 10:01 AM. If you have any questions, ask your nurse or doctor.          acetaminophen 325 MG tablet Commonly known as: Tylenol Take 2 tablets (650 mg total) by mouth every 4 (four) hours as needed (for pain scale < 4).   atomoxetine 40 MG capsule Commonly known as: STRATTERA Take 40 mg by mouth every  morning.   FLUoxetine 40 MG capsule Commonly known as: PROZAC Take 40 mg by mouth daily.   ibuprofen 600 MG tablet Commonly known as: ADVIL Take 1 tablet (600 mg total) by mouth every 6 (six) hours.   methimazole 5 MG tablet Commonly known as: TAPAZOLE Take 2 tablets (10 mg total) by mouth daily.          OBJECTIVE:   PHYSICAL EXAM: VS: BP 124/76 (BP Location: Left Arm, Patient Position: Sitting)   Pulse 88   Ht 5' (1.524 m)   Wt 178 lb (80.7 kg)   SpO2 96%   BMI 34.76 kg/m    EXAM: General: Pt appears well and is in NAD  Neck: General: Supple without adenopathy. Thyroid:  No goiter or nodules appreciated.   Lungs: Clear with good BS bilat   Heart: Auscultation: RRR.  Extremities:  BL LE: No pretibial edema   Mental Status: Judgment, insight: Intact Memory: Intact for recent and remote events Mood and affect: No depression, anxiety, or agitation     DATA REVIEWED:  ****     Results for WRENN, STOURS (MRN [id number]) as of 12/22/2020 15:20  Ref. Range 04/10/2018 09:43  Thyrotropin Receptor Ab Latest Ref Range: <=16.0 % 30.4 (H)      ASSESSMENT / PLAN / RECOMMENDATIONS:   Hyperthyroidism :   - She is clinically euthyroid  - No local neck symptoms  -She has been on methimazole since November 2023, she needed 1 tablet a day throughout the pregnancy, -Her TFTs today show worsening, will increase methimazole as below - PTU caused metallic taste int he past -Repeat TFTs in 2 months  Medication Increase methimazole 5 mg, 2 tabs daily   2. Obesity :  -I referral has been placed to medical weight management clinic for further assistance, as she has not been able to lose weight despite lifestyle changes per patient  Follow-up 4 months   Signed electronically by: Lyndle Herrlich, MD  [facility] Endocrinology  [facility] [address] [place], [place] [address] Phone: [phone] FAX: [phone]       CC: Carlean Jews, NP [address] [place] [address] Phone: [phone]  Fax: [phone]   Return to Endocrinology clinic as below: No future appointments.

## 2023-05-12 LAB — TSH: TSH: 0.01 m[IU]/L — ABNORMAL LOW

## 2023-05-12 LAB — T4, FREE: Free T4: 1.6 ng/dL (ref 0.8–1.8)

## 2023-05-12 MED ORDER — METHIMAZOLE 5 MG PO TABS: 10.0000 mg | ORAL_TABLET | Freq: Every day | ORAL | 2 refills | Status: DC

## 2023-06-06 ENCOUNTER — Encounter: Payer: Self-pay | Admitting: Nurse Practitioner

## 2023-06-06 ENCOUNTER — Ambulatory Visit: Payer: Medicaid Other | Admitting: Nurse Practitioner

## 2023-06-06 VITALS — BP 130/86 | HR 103 | Temp 98.2°F | Resp 16 | Ht 60.0 in | Wt 180.2 lb

## 2023-06-06 DIAGNOSIS — K219 Gastro-esophageal reflux disease without esophagitis: Secondary | ICD-10-CM

## 2023-06-06 DIAGNOSIS — E559 Vitamin D deficiency, unspecified: Secondary | ICD-10-CM

## 2023-06-06 DIAGNOSIS — Z6835 Body mass index (BMI) 35.0-35.9, adult: Secondary | ICD-10-CM

## 2023-06-06 DIAGNOSIS — E0500 Thyrotoxicosis with diffuse goiter without thyrotoxic crisis or storm: Secondary | ICD-10-CM

## 2023-06-06 DIAGNOSIS — E66812 Obesity, class 2: Secondary | ICD-10-CM | POA: Diagnosis not present

## 2023-06-06 DIAGNOSIS — E6609 Other obesity due to excess calories: Secondary | ICD-10-CM

## 2023-06-06 NOTE — Progress Notes (Signed)
 [facility] [address] [place], [place] [address]  Internal MEDICINE  Office Visit Note  Patient Name: Debra Hinton [facility]  [id number]  [id number]  Date of Service: 06/06/2023   Complaints/HPI Pt is here for establishment of PCP. Chief Complaint  Patient presents with   New Patient (Initial Visit)    Weight loss     HPI Debra Hinton presents for a new patient visit to establish care.  Well-appearing 28 y.o. female with graves disease, anxiety, depression, ADHD, GERD, IBS. 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  Exercise: works out 40 minutes daily, 7 days a week.  Tobacco use: none  Alcohol use: none  Illicit drug use: none  Pap smear: last year with OBGYN Labs: due for routine labs New or worsening pain: none  Wants to try wegovy ,m reports that she already called her insurance and checked what is covered and preferred and this is the medication she would like to try.  Had low vitamin D  level back in 2019    Current Medication: Outpatient Encounter Medications as of 06/06/2023  Medication Sig   atomoxetine (STRATTERA) 40 MG capsule Take 40 mg by mouth every morning.   FLUoxetine (PROZAC) 40 MG capsule Take 40 mg by mouth daily.   ibuprofen  (ADVIL ) 600 MG tablet Take 1 tablet (600 mg total) by mouth every 6 (six) hours.   methimazole  (TAPAZOLE ) 5 MG tablet Take 2 tablets (10 mg total) by mouth daily.   acetaminophen  (TYLENOL ) 325 MG tablet Take 2 tablets (650 mg total) by mouth every 4 (four) hours as needed (for pain scale < 4). (Patient not taking: Reported on 06/06/2023)   No facility-administered encounter medications on file as of 06/06/2023.    Surgical History: Past Surgical History:  Procedure Laterality Date   NO PAST SURGERIES      Medical History: Past Medical History:  Diagnosis Date   Anxiety    Anxiety and depression    Depressive disorder    GERD  (gastroesophageal reflux disease)    Gestational hypertension, third trimester    Hyperthyroidism 2019   Possible Graves disease   IBS (irritable bowel syndrome)    Irritable bowel syndrome (IBS)    Thyroid  disease     Family History: Family History  Problem Relation Age of Onset   Breast cancer Other         mggm   Hypertension Mother    Hyperlipidemia Father    Thyroid  disease Maternal Grandmother    Dementia Maternal Grandmother    Heart attack Maternal Grandfather    Diabetes Paternal Grandmother    Alcoholism Paternal Grandmother    Cancer Paternal Grandfather        pancreatic   Healthy Brother    Hypothyroidism Maternal Aunt    Hypothyroidism Maternal Aunt    Breast cancer Maternal Aunt        30s    Social History   Socioeconomic History   Marital status: Married    Spouse name: Eva   Number of children: 1   Years of education: Not on file   Highest education level: Not on file  Occupational History   Not on file  Tobacco Use   Smoking status: Former    Current packs/day: 0.00    Types: Cigarettes    Quit date: 07/03/2018    Years since quitting: 4.9   Smokeless tobacco: Never  Vaping Use  Vaping status: Former  Substance and Sexual Activity   Alcohol use: Not Currently    Comment: 2 drinks/month   Drug use: Never    Types: Marijuana   Sexual activity: Yes    Birth control/protection: None  Other Topics Concern   Not on file  Social History Narrative   ** Merged History Encounter **       Social Drivers of Corporate Investment Banker Strain: Not on file  Food Insecurity: Not on file  Transportation Needs: Not on file  Physical Activity: Not on file  Stress: Not on file  Social Connections: Not on file  Intimate Partner Violence: Not on file     Review of Systems  Constitutional:  Positive for appetite change, fatigue and unexpected weight change. Negative for chills.  HENT:  Negative for congestion, postnasal drip, rhinorrhea,  sneezing and sore throat.   Eyes:  Negative for redness.  Respiratory:  Negative for cough, chest tightness and shortness of breath.   Cardiovascular: Negative.  Negative for chest pain and palpitations.  Gastrointestinal:  Negative for abdominal pain, constipation, diarrhea, nausea and vomiting.  Genitourinary:  Negative for dysuria and frequency.  Musculoskeletal:  Negative for arthralgias, back pain, joint swelling and neck pain.  Skin:  Negative for rash.  Neurological: Negative.  Negative for tremors and numbness.  Hematological:  Negative for adenopathy. Does not bruise/bleed easily.  Psychiatric/Behavioral:  Negative for behavioral problems (Depression), sleep disturbance and suicidal ideas. The patient is not nervous/anxious.     Vital Signs: BP 130/86   Pulse (!) 103   Temp 98.2 F (36.8 C)   Resp 16   Ht 5' (1.524 m)   Wt 180 lb 3.2 oz (81.7 kg)   SpO2 99%   BMI 35.19 kg/m    Physical Exam Vitals reviewed.  Constitutional:      General: She is not in acute distress.    Appearance: Normal appearance. She is obese. She is not ill-appearing.  HENT:     Head: Normocephalic and atraumatic.  Eyes:     Pupils: Pupils are equal, round, and reactive to light.  Cardiovascular:     Rate and Rhythm: Normal rate and regular rhythm.  Pulmonary:     Effort: Pulmonary effort is normal. No respiratory distress.  Neurological:     Mental Status: She is alert and oriented to person, place, and time.  Psychiatric:        Mood and Affect: Mood normal.        Behavior: Behavior normal.       Assessment/Plan: 1. Graves disease Routine labs ordered. Thyroid  labs were done in december via her endocrinologist, and she reports that she already discussed what she wants to do for weight loss and her endocrinologist stated that this is ok with them.  - CBC with Differential/Platelet - CMP14+EGFR - Lipid Profile - Hgb A1C w/o eAG  2. Gastroesophageal reflux disease without  esophagitis Routine labs ordered.  - CBC with Differential/Platelet - CMP14+EGFR - Lipid Profile - Hgb A1C w/o eAG  3. Vitamin D  deficiency (Primary) Routine lab ordered  - Vitamin D  (25 hydroxy)  4. Class 2 obesity due to excess calories without serious comorbidity with body mass index (BMI) of 35.0 to 35.9 in adult Routine labs ordered. Will order wegovy  once labs are resulted and reviewed. And will further review labs with patient at her next office visit.  - CBC with Differential/Platelet - CMP14+EGFR - Lipid Profile - Hgb A1C w/o eAG  General Counseling: Debra Hinton verbalizes understanding of the findings of todays visit and agrees with plan of treatment. I have discussed any further diagnostic evaluation that may be needed or ordered today. We also reviewed her medications today. she has been encouraged to call the office with any questions or concerns that should arise related to todays visit.    Orders Placed This Encounter  Procedures   CBC with Differential/Platelet   CMP14+EGFR   Lipid Profile   Hgb A1C w/o eAG   Vitamin D  (25 hydroxy)    No orders of the defined types were placed in this encounter.   Return for CPE, Fabrice Dyal PCP at earliest available opening at patient preference. .  Time spent:30 Minutes Time spent with patient included reviewing progress notes, labs, imaging studies, and discussing plan for follow up.   [place] Controlled Substance Database was reviewed by me for overdose risk score (ORS)   This patient was seen by Mardy Maxin, FNP-C in collaboration with Dr. Sigrid Bathe as a part of collaborative care agreement.   Glenna Brunkow R. Maxin, MSN, FNP-C Internal Medicine

## 2023-06-07 ENCOUNTER — Encounter: Payer: Self-pay | Admitting: Nurse Practitioner

## 2023-06-07 LAB — CMP14+EGFR
ALT: 18 [IU]/L (ref 0–32)
AST: 17 [IU]/L (ref 0–40)
Albumin: 4.5 g/dL (ref 4.0–5.0)
Alkaline Phosphatase: 112 [IU]/L (ref 44–121)
BUN/Creatinine Ratio: 20 (ref 9–23)
BUN: 14 mg/dL (ref 6–20)
Bilirubin Total: 0.4 mg/dL (ref 0.0–1.2)
CO2: 23 mmol/L (ref 20–29)
Calcium: 9.3 mg/dL (ref 8.7–10.2)
Chloride: 99 mmol/L (ref 96–106)
Creatinine, Ser: 0.71 mg/dL (ref 0.57–1.00)
Globulin, Total: 2.3 g/dL (ref 1.5–4.5)
Glucose: 87 mg/dL (ref 70–99)
Potassium: 4.5 mmol/L (ref 3.5–5.2)
Sodium: 138 mmol/L (ref 134–144)
Total Protein: 6.8 g/dL (ref 6.0–8.5)
eGFR: 119 mL/min/{1.73_m2} (ref >59)

## 2023-06-07 LAB — CBC WITH DIFFERENTIAL/PLATELET
Basophils Absolute: 0 10*3/uL (ref 0.0–0.2)
Basos: 1 %
EOS (ABSOLUTE): 0.2 10*3/uL (ref 0.0–0.4)
Eos: 3 %
Hematocrit: 44.2 % (ref 34.0–46.6)
Hemoglobin: 14.2 g/dL (ref 11.1–15.9)
Immature Grans (Abs): 0 10*3/uL (ref 0.0–0.1)
Immature Granulocytes: 0 %
Lymphocytes Absolute: 1.5 10*3/uL (ref 0.7–3.1)
Lymphs: 24 %
MCH: 26.2 pg — ABNORMAL LOW (ref 26.6–33.0)
MCHC: 32.1 g/dL (ref 31.5–35.7)
MCV: 82 fL (ref 79–97)
Monocytes Absolute: 0.4 10*3/uL (ref 0.1–0.9)
Monocytes: 6 %
Neutrophils Absolute: 4.1 10*3/uL (ref 1.4–7.0)
Neutrophils: 66 %
Platelets: 303 10*3/uL (ref 150–450)
RBC: 5.42 x10E6/uL — ABNORMAL HIGH (ref 3.77–5.28)
RDW: 15.7 % — ABNORMAL HIGH (ref 11.7–15.4)
WBC: 6.2 10*3/uL (ref 3.4–10.8)

## 2023-06-07 LAB — LIPID PANEL
Chol/HDL Ratio: 2.9 {ratio} (ref 0.0–4.4)
Cholesterol, Total: 155 mg/dL (ref 100–199)
HDL: 53 mg/dL (ref >39)
LDL Chol Calc (NIH): 75 mg/dL (ref 0–99)
Triglycerides: 156 mg/dL — ABNORMAL HIGH (ref 0–149)
VLDL Cholesterol Cal: 27 mg/dL (ref 5–40)

## 2023-06-07 LAB — VITAMIN D 25 HYDROXY (VIT D DEFICIENCY, FRACTURES): Vit D, 25-Hydroxy: 40.4 ng/mL (ref 30.0–100.0)

## 2023-06-07 LAB — HGB A1C W/O EAG: Hgb A1c MFr Bld: 5.1 % (ref 4.8–5.6)

## 2023-06-09 ENCOUNTER — Other Ambulatory Visit: Payer: Self-pay | Admitting: Nurse Practitioner

## 2023-06-09 ENCOUNTER — Telehealth: Payer: Self-pay

## 2023-06-09 DIAGNOSIS — E66812 Obesity, class 2: Secondary | ICD-10-CM

## 2023-06-09 MED ORDER — SEMAGLUTIDE-WEIGHT MANAGEMENT 0.25 MG/0.5ML ~~LOC~~ SOAJ
0.2500 mg | SUBCUTANEOUS | 0 refills | Status: AC
Start: 2023-06-09 — End: 2023-07-01

## 2023-06-09 MED ORDER — SEMAGLUTIDE-WEIGHT MANAGEMENT 0.5 MG/0.5ML ~~LOC~~ SOAJ
0.5000 mg | SUBCUTANEOUS | 5 refills | Status: DC
Start: 2023-06-30 — End: 2023-11-14

## 2023-06-09 NOTE — Telephone Encounter (Signed)
Pt advised wegovy PA is approved

## 2023-06-09 NOTE — Progress Notes (Signed)
Labs reviewed, grossly normal except for slightly elevated triglyceride level and slightly elevated RBC and slightly low MCH.  No interventions recommended at this time for these abnormal labs.  [facility] prescription ordered, requires prior authorization.  Will follow up at her next office visit in February.

## 2023-06-09 NOTE — Telephone Encounter (Signed)
Pt advised for labs see other note by alyssa and discuss in detail at next follow up and advised to diet and exercised  for triglycerides

## 2023-07-18 ENCOUNTER — Encounter: Payer: Medicaid Other | Admitting: Nurse Practitioner

## 2023-08-16 ENCOUNTER — Telehealth: Payer: Self-pay | Admitting: Nurse Practitioner

## 2023-08-16 ENCOUNTER — Encounter: Payer: Self-pay | Admitting: Nurse Practitioner

## 2023-08-16 ENCOUNTER — Ambulatory Visit (INDEPENDENT_AMBULATORY_CARE_PROVIDER_SITE_OTHER): Payer: Medicaid Other | Admitting: Nurse Practitioner

## 2023-08-16 VITALS — BP 123/77 | HR 93 | Temp 98.2°F | Resp 16 | Ht 60.0 in | Wt 160.8 lb

## 2023-08-16 DIAGNOSIS — Z0001 Encounter for general adult medical examination with abnormal findings: Secondary | ICD-10-CM

## 2023-08-16 DIAGNOSIS — E0500 Thyrotoxicosis with diffuse goiter without thyrotoxic crisis or storm: Secondary | ICD-10-CM | POA: Diagnosis not present

## 2023-08-16 DIAGNOSIS — E66811 Other obesity due to excess calories: Secondary | ICD-10-CM

## 2023-08-16 DIAGNOSIS — K219 Gastro-esophageal reflux disease without esophagitis: Secondary | ICD-10-CM | POA: Diagnosis not present

## 2023-08-16 DIAGNOSIS — E6609 Other obesity due to excess calories: Secondary | ICD-10-CM

## 2023-08-16 DIAGNOSIS — E66812 Obesity, class 2: Secondary | ICD-10-CM

## 2023-08-16 DIAGNOSIS — Z6831 Body mass index (BMI) 31.0-31.9, adult: Secondary | ICD-10-CM

## 2023-08-16 NOTE — Progress Notes (Signed)
 [facility] [address] [place], [place] [address]  Internal MEDICINE  Office Visit Note  Patient Name: Debra Hinton [facility]  [id number]  [id number]  Date of Service: 08/16/2023  Chief Complaint  Patient presents with  . Depression  . Gastroesophageal Reflux  . Hypertension  . Annual Exam    HPI Debra Hinton presents for an annual well visit and physical exam.  Well-appearing 28 y.o. year-old @female @ with ___________  Pap smear: goes to [facility] exam and/or foot exam: Labs: reviewed with patient today  New or worsening pain: none Other concerns: On wegovy, lost 20 lbs so far.    Current Medication: Outpatient Encounter Medications as of 08/16/2023  Medication Sig  . atomoxetine (STRATTERA) 40 MG capsule Take 40 mg by mouth every morning.  Marland Kitchen FLUoxetine (PROZAC) 40 MG capsule Take 40 mg by mouth daily.  Marland Kitchen ibuprofen (ADVIL) 600 MG tablet Take 1 tablet (600 mg total) by mouth every 6 (six) hours.  . methimazole (TAPAZOLE) 5 MG tablet Take 2 tablets (10 mg total) by mouth daily.  . Semaglutide-Weight Management 0.5 MG/0.5ML SOAJ Inject 0.5 mg into the skin once a week.  Marland Kitchen acetaminophen (TYLENOL) 325 MG tablet Take 2 tablets (650 mg total) by mouth every 4 (four) hours as needed (for pain scale < 4). (Patient not taking: Reported on 06/06/2023)   No facility-administered encounter medications on file as of 08/16/2023.    Surgical History: Past Surgical History:  Procedure Laterality Date  . NO PAST SURGERIES      Medical History: Past Medical History:  Diagnosis Date  . Anxiety   . Anxiety and depression   . Depressive disorder   . GERD (gastroesophageal reflux disease)   . Gestational hypertension, third trimester   . Hyperthyroidism 2019   Possible Graves disease  . IBS (irritable bowel syndrome)   . Irritable bowel syndrome (IBS)   . Thyroid disease     Family History: Family History  Problem Relation Age of Onset  . Breast cancer Other         mggm   . Hypertension Mother   . Hyperlipidemia Father   . Thyroid disease Maternal Grandmother   . Dementia Maternal Grandmother   . Heart attack Maternal Grandfather   . Diabetes Paternal Grandmother   . Alcoholism Paternal Grandmother   . Cancer Paternal Grandfather        pancreatic  . Healthy Brother   . Hypothyroidism Maternal Aunt   . Hypothyroidism Maternal Aunt   . Breast cancer Maternal Aunt        30s    Social History   Socioeconomic History  . Marital status: Married    Spouse name: Debra Hinton  . Number of children: 1  . Years of education: Not on file  . Highest education level: Not on file  Occupational History  . Not on file  Tobacco Use  . Smoking status: Former    Current packs/day: 0.00    Types: Cigarettes    Quit date: 07/03/2018    Years since quitting: 5.1  . Smokeless tobacco: Never  Vaping Use  . Vaping status: Former  Substance and Sexual Activity  . Alcohol use: Not Currently    Comment: 2 drinks/month  . Drug use: Never    Types: Marijuana  . Sexual activity: Yes    Birth control/protection: None  Other Topics Concern  . Not on file  Social History Narrative   ** Merged History Encounter **  Social Drivers of Corporate investment banker Strain: Not on file  Food Insecurity: Not on file  Transportation Needs: Not on file  Physical Activity: Not on file  Stress: Not on file  Social Connections: Not on file  Intimate Partner Violence: Not on file      Review of Systems  Vital Signs: BP 123/77   Pulse 93   Temp 98.2 F (36.8 C)   Resp 16   Ht 5' (1.524 m)   Wt 160 lb 12.8 oz (72.9 kg)   SpO2 98%   BMI 31.40 kg/m    Physical Exam     Assessment/Plan: There are no diagnoses linked to this encounter.    General Counseling: Debra Hinton verbalizes understanding of the findings of todays visit and agrees with plan of treatment. I have discussed any further diagnostic evaluation that may be needed or ordered today. We also  reviewed her medications today. she has been encouraged to call the office with any questions or concerns that should arise related to todays visit.    No orders of the defined types were placed in this encounter.   No orders of the defined types were placed in this encounter.   No follow-ups on file.   Total time spent:*** Minutes Time spent includes review of chart, medications, test results, and follow up plan with the patient.   [place] Controlled Substance Database was reviewed by me.  This patient was seen by Sallyanne Kuster, FNP-C in collaboration with Dr. Beverely Risen as a part of collaborative care agreement.  Veona Bittman R. Tedd Sias, MSN, FNP-C Internal medicine

## 2023-08-16 NOTE — Telephone Encounter (Signed)
 MB full,sent message to schedule 6 month follow up-Toni

## 2023-09-13 ENCOUNTER — Telehealth (INDEPENDENT_AMBULATORY_CARE_PROVIDER_SITE_OTHER): Payer: Medicaid Other | Admitting: Internal Medicine

## 2023-09-13 ENCOUNTER — Encounter: Payer: Self-pay | Admitting: Internal Medicine

## 2023-09-13 VITALS — Ht 60.0 in | Wt 155.0 lb

## 2023-09-13 DIAGNOSIS — E0500 Thyrotoxicosis with diffuse goiter without thyrotoxic crisis or storm: Secondary | ICD-10-CM | POA: Diagnosis not present

## 2023-09-13 DIAGNOSIS — E0590 Thyrotoxicosis, unspecified without thyrotoxic crisis or storm: Secondary | ICD-10-CM | POA: Diagnosis not present

## 2023-09-13 MED ORDER — METHIMAZOLE 5 MG PO TABS: 10.0000 mg | ORAL_TABLET | Freq: Every day | ORAL | 2 refills | Status: AC

## 2023-09-13 NOTE — Progress Notes (Signed)
 Virtual Visit via Video Note  I connected with Mariaha Ellington Ribera on 09/13/23 at 10:50 AM EDT by a video enabled telemedicine application and verified that I am speaking with the correct person using two identifiers.   I discussed the limitations of evaluation and management by telemedicine and the availability of in person appointments. The patient expressed understanding and agreed to proceed.   -Location of the patient : home  -Location of the provider : Office -The names of all persons participating in the telemedicine service : Pt and myself        Name: Thomas Rhude [facility]  MRN/ DOB: [id number], [birth year]    Age/ Sex: 28 y.o., female     PCP: Laurence Pons, NP   Reason for Endocrinology Evaluation: Hyperthyroidism     Initial Endocrinology Clinic Visit:  04/10/2018    PATIENT IDENTIFIER: Ms. Donesha Wallander Wawrzyniak is a 28 y.o., female with a past medical history of IBS, GAD, and Graves' disease. She has followed with [facility] since 04/10/2018 for consultative assistance with management of her Hyperthyroidism .   HISTORICAL SUMMARY:  [name], 2019 with c/o constipation alternating with diarrhea as well as anxiety and weight gain. Her Labs revealed a suppressed  TSH at < 0.006 uIU/mL and elevated FT4 and T3. A thyroid  ultrasound was unrevealing.    On her initial visit to our office in November, 2020 her TSH continued to be low but with normalization of her FT4. We decided to withhold therapy due to improvement in her TFT's    By 2020 she became pregnant and was started on PTU but was lost to follow-up until her return to our office in 10/2020 when she was [redacted] weeks pregnant at the time with a suppressed TSH 0.006 uIU/mL     She was lost to follow-up by November 2022 when she was 36.6 weeks of gestation the time until her return to our clinic in October 2023 when she was in her first  trimester   She is S/P vaginal delivery 04/2021  She was lost to follow-up until her return 02/2022 with fourth pregnancy at 13 weeks of gestation  She was started on methimazole  during her second trimester 03/2022  She is s/p vaginal delivery 09/05/2022  PTU causes metallic taste   FH with thyroid  disease. SUBJECTIVE:     Today (09/13/2023):  Ms. Dreyfuss is here for a follow-up on Graves' disease .  Patient has been noted weight loss, she is on Wegovy  Denies local neck swelling  Denies palpitation  Denies constipation or diarrhea  She has noted slight tremor No anxiety No heat intolerance  PTU caused chemical taste   Methimazole  5 mg, 2 tabs  daily    HISTORY:  Past Medical History:  Past Medical History:  Diagnosis Date   Anxiety    Anxiety and depression    Depressive disorder    GERD (gastroesophageal reflux disease)    Gestational hypertension, third trimester    Hyperthyroidism 2019   Possible Graves disease   IBS (irritable bowel syndrome)    Irritable bowel syndrome (IBS)    Thyroid  disease    Past Surgical History:  Past Surgical History:  Procedure Laterality Date   NO PAST SURGERIES     Social History:  reports that she quit smoking about 5 years ago. Her smoking use included cigarettes. She has never used smokeless tobacco. She reports that she does not currently use alcohol.  She reports that she does not use drugs. Family History:  Family History  Problem Relation Age of Onset   Breast cancer Other         mggm   Hypertension Mother    Hyperlipidemia Father    Thyroid  disease Maternal Grandmother    Dementia Maternal Grandmother    Heart attack Maternal Grandfather    Diabetes Paternal Grandmother    Alcoholism Paternal Grandmother    Cancer Paternal Grandfather        pancreatic   Healthy Brother    Hypothyroidism Maternal Aunt    Hypothyroidism Maternal Aunt    Breast cancer Maternal Aunt        30s     HOME  MEDICATIONS: Allergies as of 09/13/2023       Reactions   Ptu [propylthiouracil ] Other (See Comments)   Chemical taste        Medication List        Accurate as of September 13, 2023  7:19 AM. If you have any questions, ask your nurse or doctor.          atomoxetine 40 MG capsule Commonly known as: STRATTERA Take 40 mg by mouth every morning.   FLUoxetine 40 MG capsule Commonly known as: PROZAC Take 40 mg by mouth daily.   ibuprofen  600 MG tablet Commonly known as: ADVIL  Take 1 tablet (600 mg total) by mouth every 6 (six) hours.   methimazole  5 MG tablet Commonly known as: TAPAZOLE  Take 2 tablets (10 mg total) by mouth daily.   Semaglutide -Weight Management 0.5 MG/0.5ML Soaj Inject 0.5 mg into the skin once a week.          OBJECTIVE:   PHYSICAL EXAM: VS: There were no vitals taken for this visit.   EXAM: General: Pt appears well and is in NAD  Neck: General: Supple without adenopathy. Thyroid :  No goiter or nodules appreciated.   Lungs: Clear with good BS bilat   Heart: Auscultation: RRR.  Extremities:  BL LE: No pretibial edema   Mental Status: Judgment, insight: Intact Memory: Intact for recent and remote events Mood and affect: No depression, anxiety, or agitation     DATA REVIEWED:   Latest Reference Range & Units 06/06/23 13:17  Sodium 134 - 144 mmol/L 138  Potassium 3.5 - 5.2 mmol/L 4.5  Chloride 96 - 106 mmol/L 99  CO2 20 - 29 mmol/L 23  Glucose 70 - 99 mg/dL 87  BUN 6 - 20 mg/dL 14  Creatinine 1.61 - 0.96 mg/dL 0.45  Calcium  8.7 - 10.2 mg/dL 9.3  BUN/Creatinine Ratio 9 - 23  20  eGFR >59 mL/min/1.73 119  Alkaline Phosphatase 44 - 121 IU/L 112  Albumin 4.0 - 5.0 g/dL 4.5  AST 0 - 40 IU/L 17  ALT 0 - 32 IU/L 18  Total Protein 6.0 - 8.5 g/dL 6.8  Total Bilirubin 0.0 - 1.2 mg/dL 0.4    Results for BALDWIN, [employer] GAYLE (MRN [id number]) as of 12/22/2020 15:20  Ref. Range 04/10/2018 09:43  Thyrotropin Receptor Ab Latest Ref Range:  <=16.0 % 30.4 (H)      ASSESSMENT / PLAN / RECOMMENDATIONS:   Hyperthyroidism :   - She is clinically euthyroid  - No local neck symptoms  -She has been on methimazole  since November 2023 - PTU caused metallic taste int he past - Lab orders will be faxed to [facility] on [address], [employer] - NO changes at this time    Medication Continue methimazole  5 mg, 2  tabs daily    Follow-up 6 months   Signed electronically by: Natale Bail, MD  [facility]  [facility] [address] [place], [place] [address] Phone: [phone] FAX: [phone]      CC: Laurence Pons, NP [address] [place] [address] Phone: [phone]  Fax: [phone]   Return to Endocrinology clinic as below: Future Appointments  Date Time Provider Department Center  09/13/2023 10:50 AM Nabilah Davoli, Julian Obey, MD [facility] None  02/14/2024 11:00 AM Laurence Pons, NP [facility] None  08/16/2024 11:00 AM Laurence Pons, NP [facility] None

## 2023-10-13 ENCOUNTER — Encounter: Payer: Self-pay | Admitting: Nurse Practitioner

## 2023-10-14 IMAGING — US US MFM FETAL BPP W/O NON-STRESS
1 series · 12 of 17 positions shown · non-contrast
Comparison: none

[Series 1: us mfm fetal bpp w/o non-stress · 17 acquisitions, 12 frames shown]
[im 1/17]
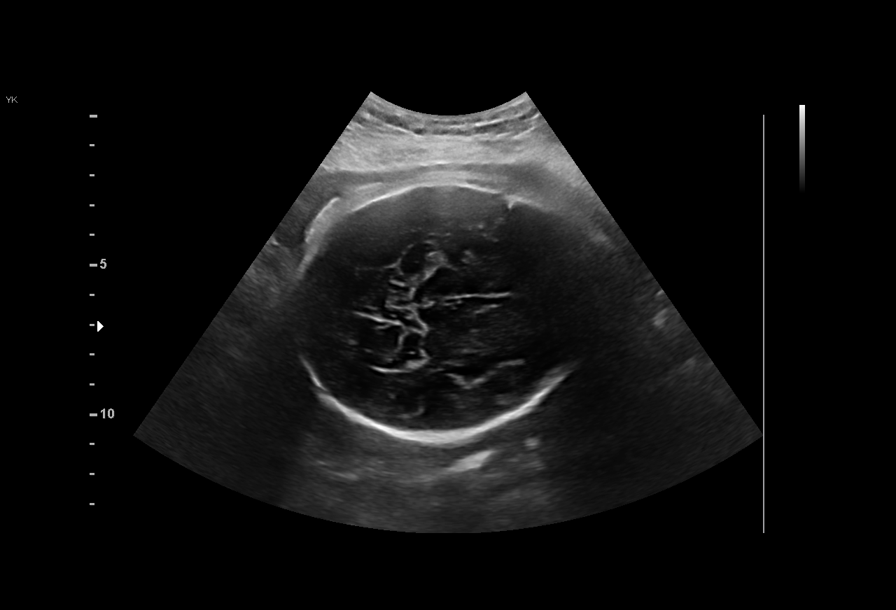
[im 3/17]
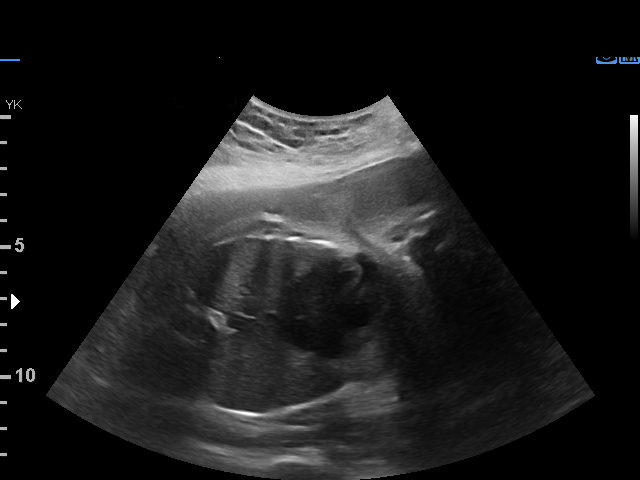
[im 4/17]
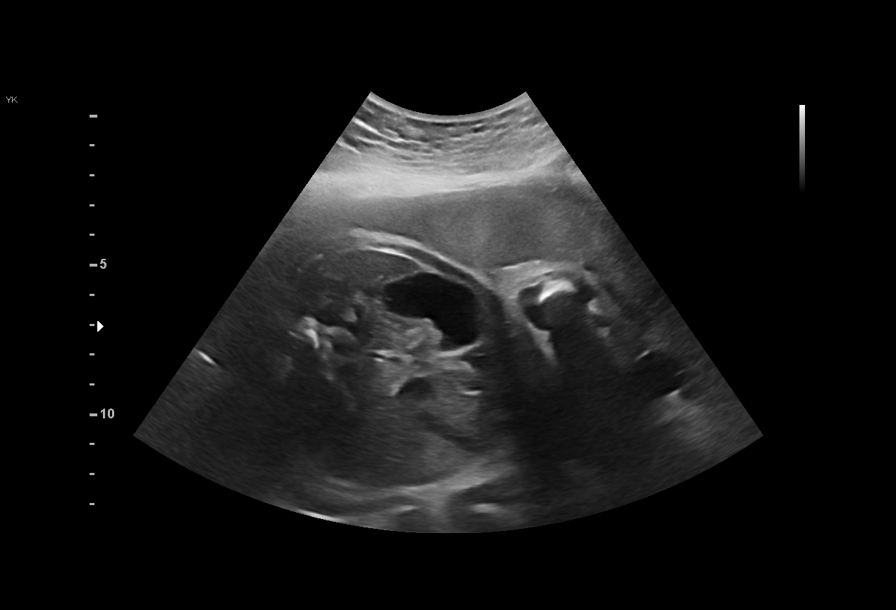
[im 5/17]
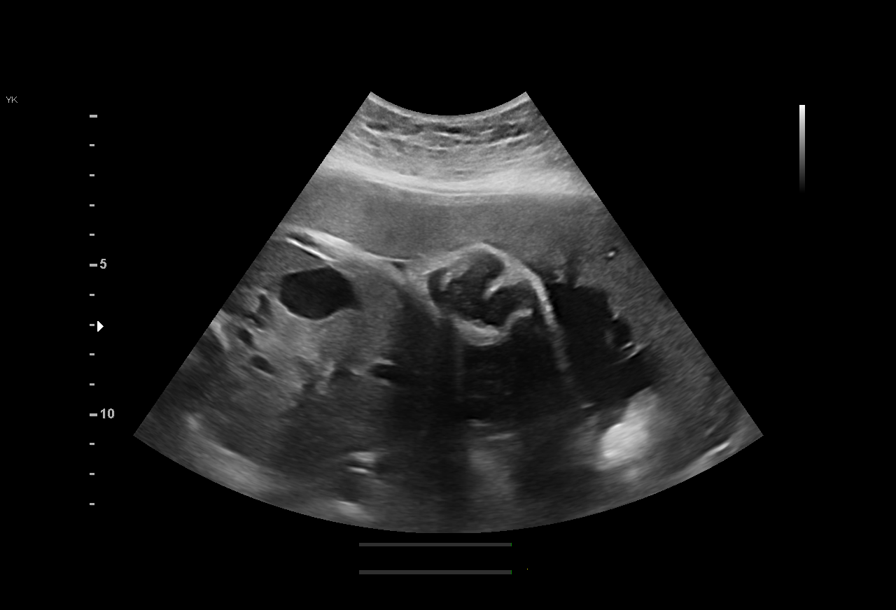
[im 7/17]
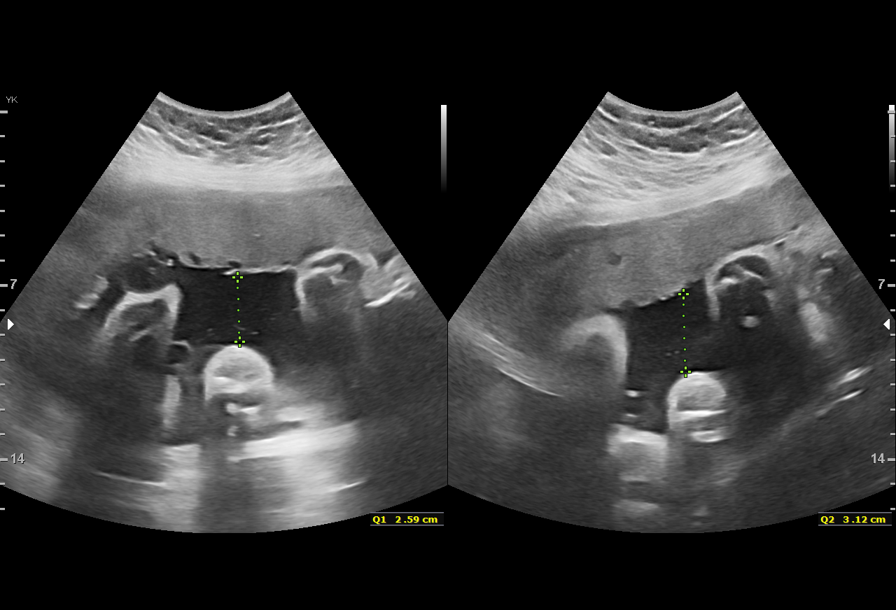
[im 8/17]
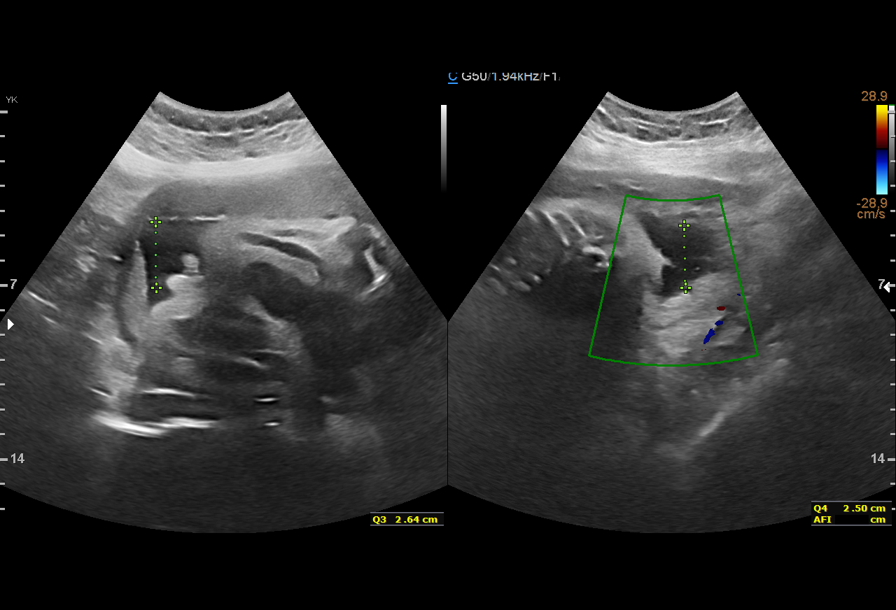
[im 10/17]
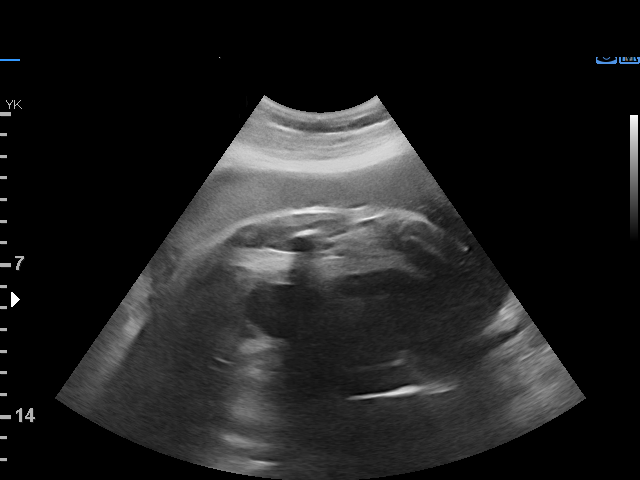
[im 11/17]
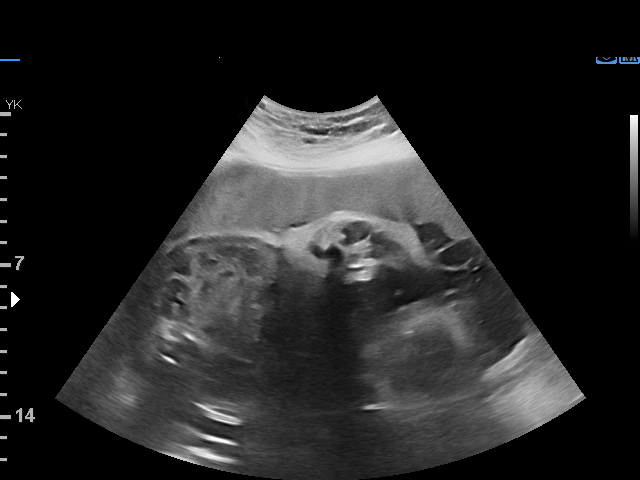
[im 13/17]
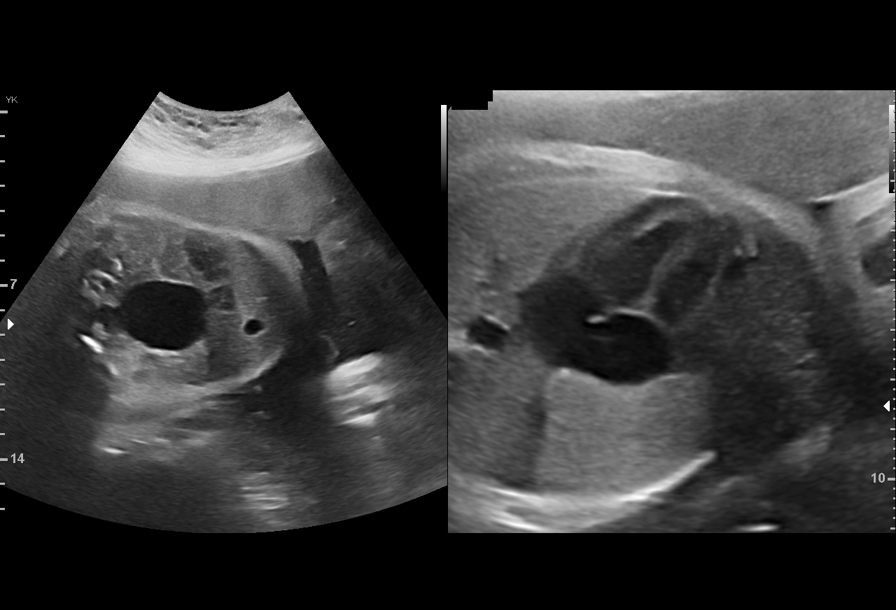
[im 14/17]
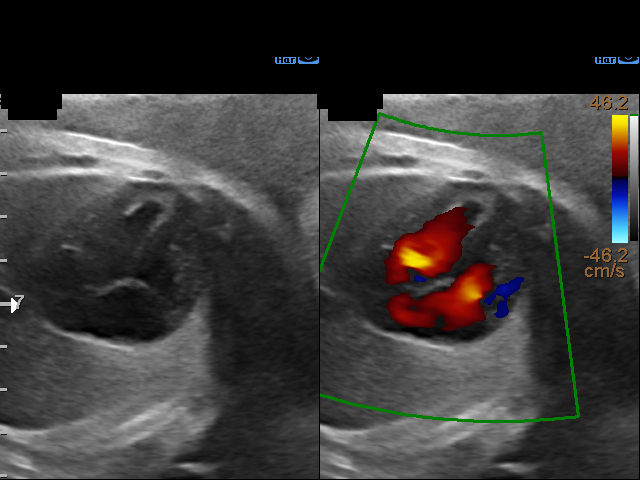
[im 15/17]
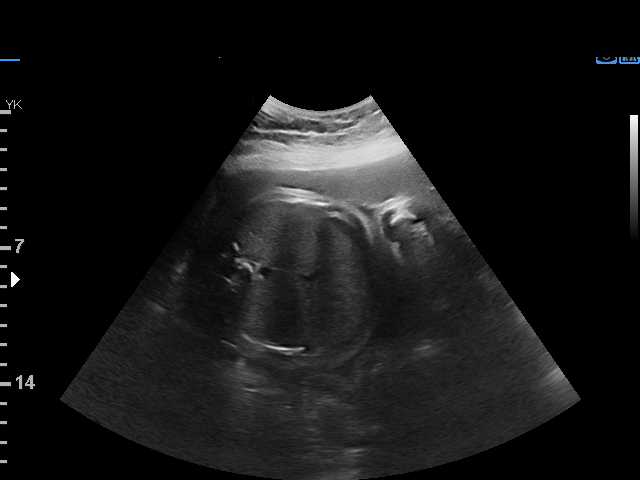
[im 17/17]
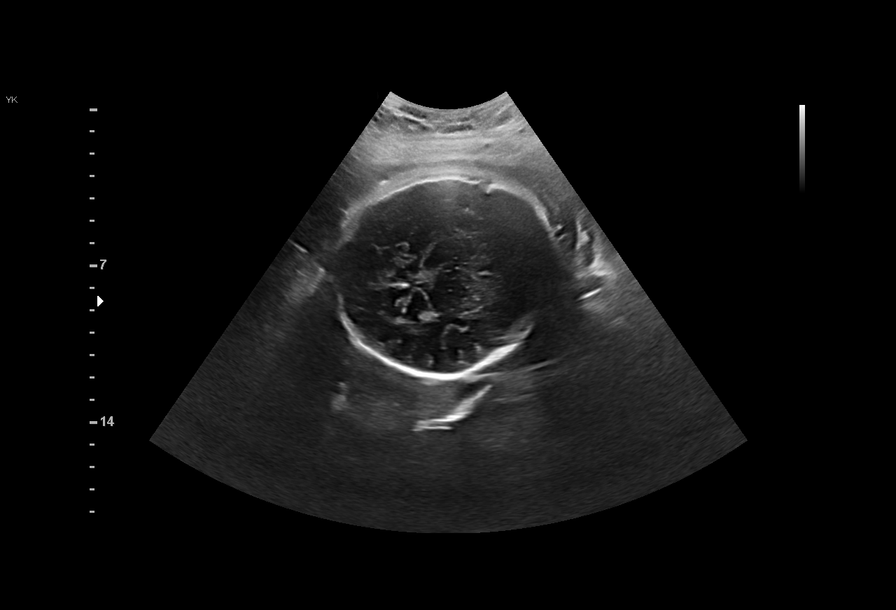

[12 of 17 positions shown; findings below may reference images not displayed]

GERONIMO

Indications

 Obesity complicating pregnancy, third
 trimester
 Hyperthyroid
 History of IUGR prior pregnancy
 Anxiety during pregnancy, second trimester     O99.342,
 Low Risk NIPS
 34 weeks gestation of pregnancy
Fetal Evaluation

 Num Of Fetuses:         1
 Fetal Heart Rate(bpm):  160
 Cardiac Activity:       Observed
 Presentation:           Cephalic
 Placenta:               Anterior
 P. Cord Insertion:      Visualized, central

 Amniotic Fluid
 AFI FV:      Within normal limits

 AFI Sum(cm)     %Tile       Largest Pocket(cm)
 10.8            25

 RUQ(cm)       RLQ(cm)       LUQ(cm)        LLQ(cm)

Biophysical Evaluation
 Amniotic F.V:   Within normal limits       F. Tone:        Observed
 F. Movement:    Observed                   Score:          [DATE]
 F. Breathing:   Observed
Gestational Age

 LMP:           34w 2d        Date:  07/29/20                 EDD:   05/05/21
 Best:          34w 2d     Det. By:  LMP  (07/29/20)          EDD:   05/05/21
Anatomy

 Cranium:               Appears normal         Stomach:                Appears normal, left
                                                                       sided
 Ventricles:            Appears normal         Bladder:                Appears normal
 Heart:                 Appears normal
                        (4CH, axis, and
                        situs)
Comments

 This patient was seen for a biophysical profile due to
 hyperthyroidism and history of a prior IUGR fetus.  She
 denies any problems since her last exam.
 A biophysical profile performed today was [DATE].
 There was normal amniotic fluid noted on today's ultrasound
 exam.
 The patient will return in 1 week for another BPP and growth
 scan.
 We will make our recommendations regarding the timing of
 delivery based on the EFW obtained next week.

## 2023-10-28 IMAGING — US US MFM FETAL BPP W/O NON-STRESS
1 series · 14 of 14 positions shown · non-contrast
Comparison: none

[Series 1: us mfm fetal bpp w/o non-stress · 14 acquisitions, 14 frames shown]
[im 1/14]
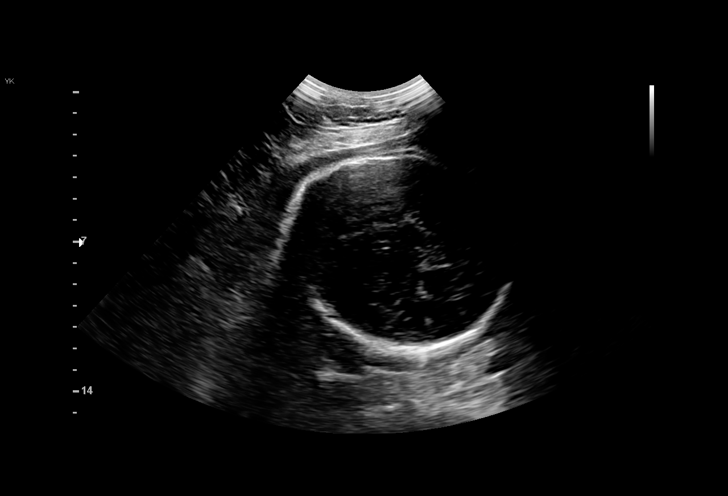
[im 2/14]
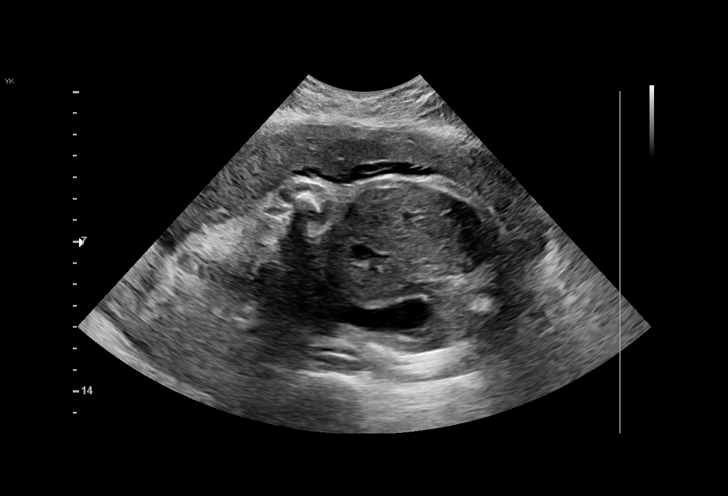
[im 3/14]
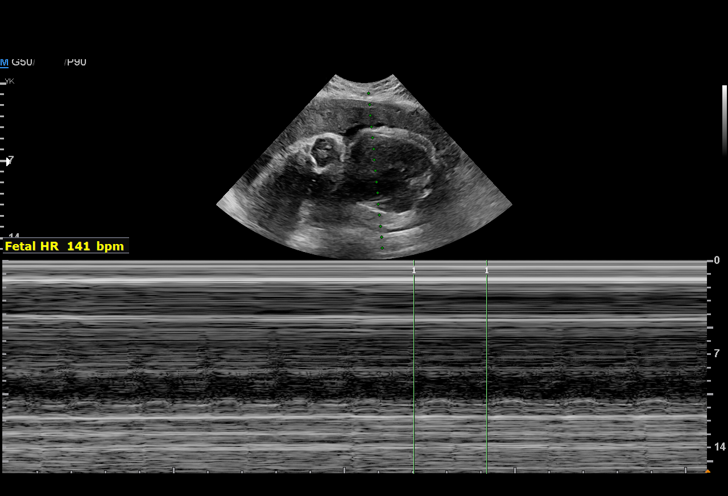
[im 4/14]
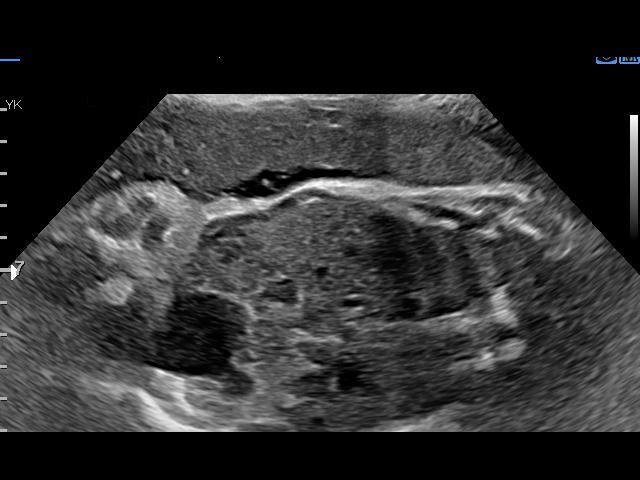
[im 5/14]
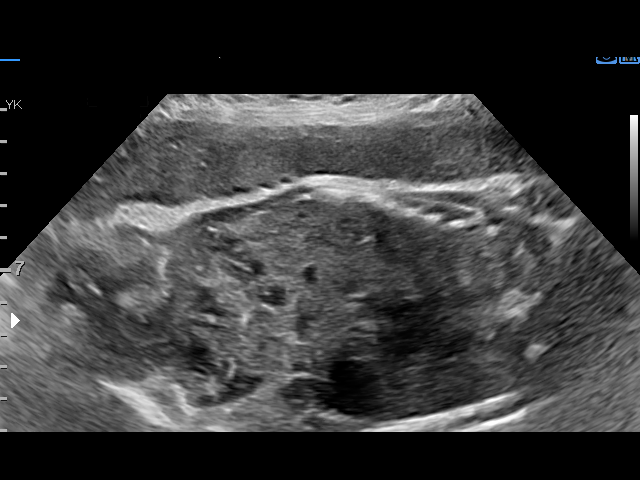
[im 6/14]
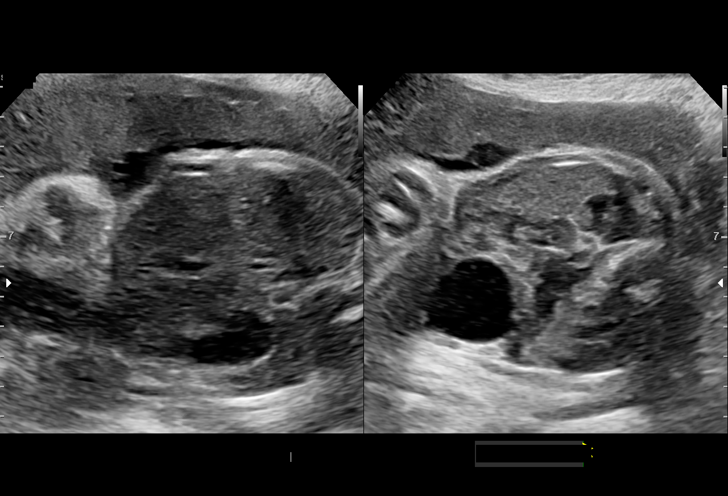
[im 7/14]
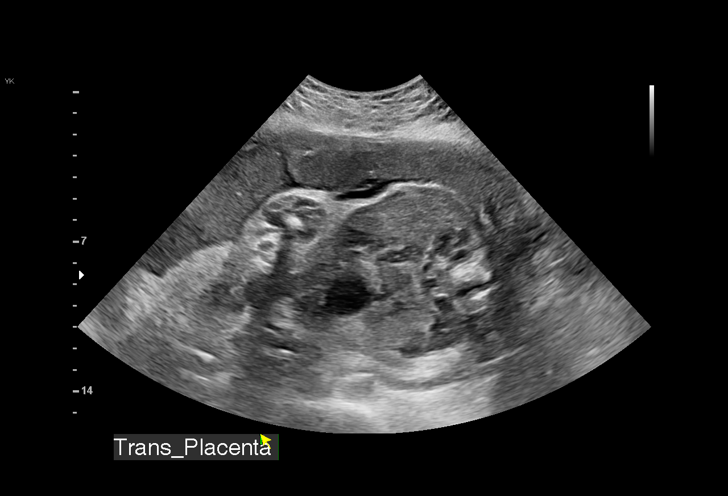
[im 8/14]
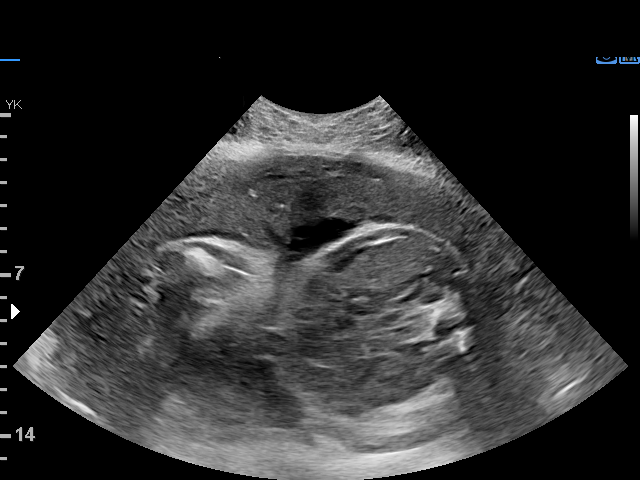
[im 9/14]
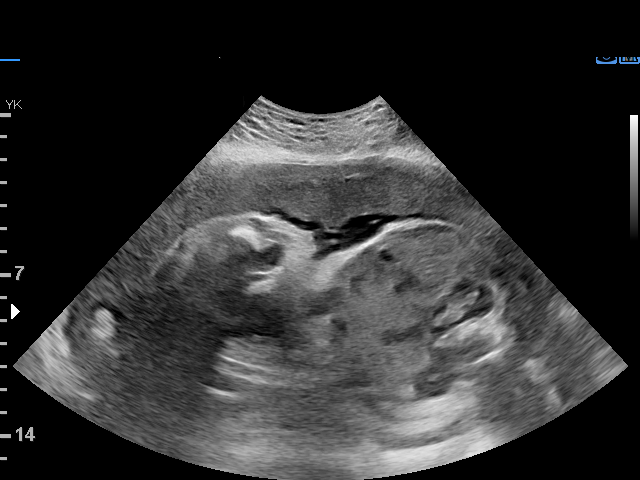
[im 10/14]
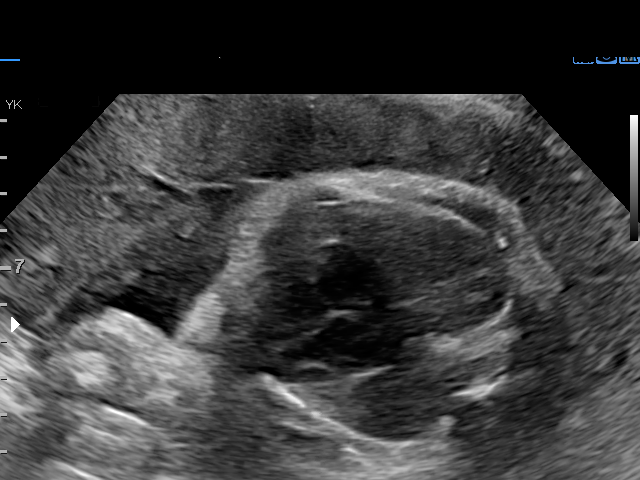
[im 11/14]
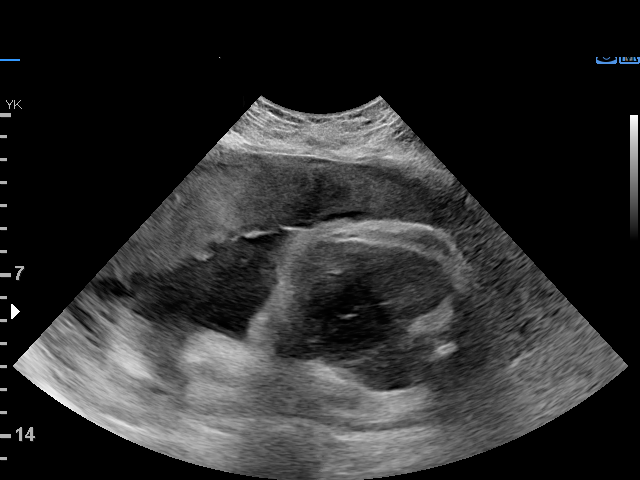
[im 12/14]
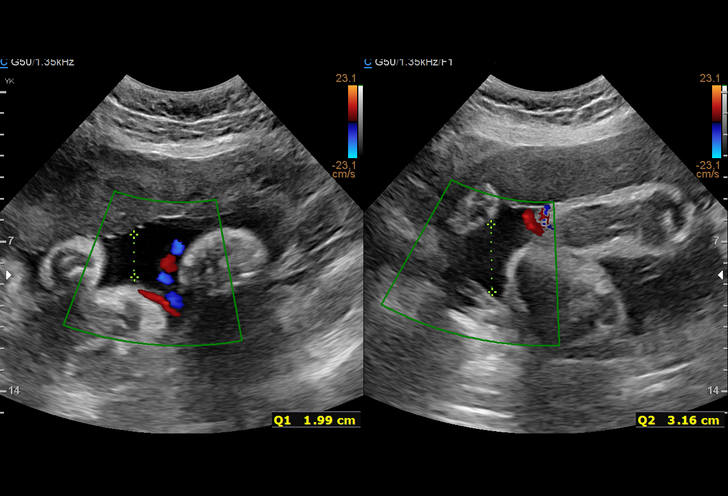
[im 13/14]
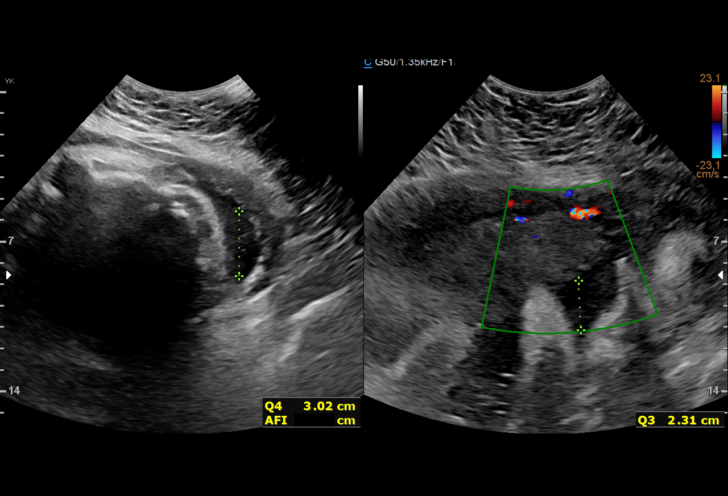
[im 14/14]
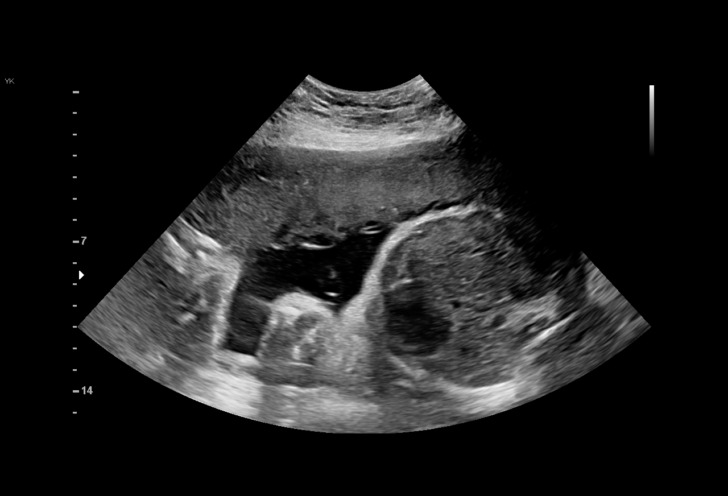

[14 of 14 positions shown; findings below may reference images not displayed]

AZMEIL

Indications

 Obesity complicating pregnancy, third
 trimester
 Hyperthyroid
 History of IUGR prior pregnancy
 Anxiety during pregnancy, second trimester     O99.342,
 Low Risk NIPS
 36 weeks gestation of pregnancy
Fetal Evaluation

 Num Of Fetuses:         1
 Fetal Heart Rate(bpm):  141
 Cardiac Activity:       Observed
 Presentation:           Cephalic
 Placenta:               Anterior

 Amniotic Fluid
 AFI FV:      Within normal limits

 AFI Sum(cm)     %Tile       Largest Pocket(cm)
 10.5            26

 RUQ(cm)       RLQ(cm)       LUQ(cm)        LLQ(cm)
 2             3
Biophysical Evaluation

 Amniotic F.V:   Within normal limits       F. Tone:        Observed
 F. Movement:    Observed                   Score:          [DATE]
 F. Breathing:   Observed
Gestational Age

 LMP:           36w 2d        Date:  07/29/20                 EDD:   05/05/21
 Best:          36w 2d     Det. By:  LMP  (07/29/20)          EDD:   05/05/21
Anatomy

 Stomach:               Appears normal, left   Bladder:                Appears normal
                        sided
Impression

 Ms. Haasiley is here for antenatal testing regarding
 hyperthyroidism.
 A biophysical profile of [DATE] was observed with good fetal
 movement and amniotic fluid volume.

 She is drawning TSH labs today per her report and has a
 clinic visit this afternoon.

 We discussed timing of delivery. She is trying to coordinate
 her husband being home as he is only off 1 week. The ideal
 time of delivery for her and her family is 38 weeks per Ms.
 Del request.

 She is concerned about repeat FGR and her hyperthyroidism.

 In review of her history it is reasonable for delivery between
 38-39 weeks assuming elevated TRAB's as noted in 8589,
 which have not been repeated. We discussed early term
 delivery can result in NICU admission. We further
 recommend shared decision making with her providers as
 well. She expressed an understanding of the risk and benefits
 of delivery prior to 39 weeks. She will discuss her labs results
 with her endocrinologist once resulted.

 Finally I explained that if all is normal including blood
 pressure, fetal surviellance and TFT's a 39 week delivery is
 optimal.
Recommendations

 Continue weekly testing.

## 2023-11-12 IMAGING — US US MFM FETAL BPP W/ NON-STRESS
1 series · 12 of 28 positions shown · non-contrast
Comparison: none

[Series 1: us mfm fetal bpp w/ non-stress · 48 acquisitions, 12 frames shown]
[im 2/48]
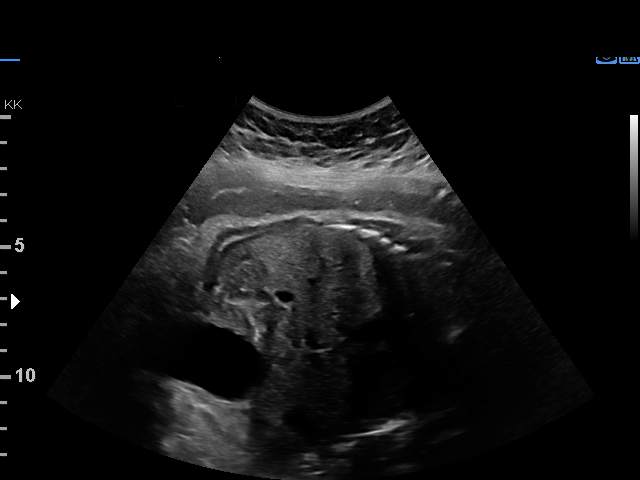
[im 6/48]
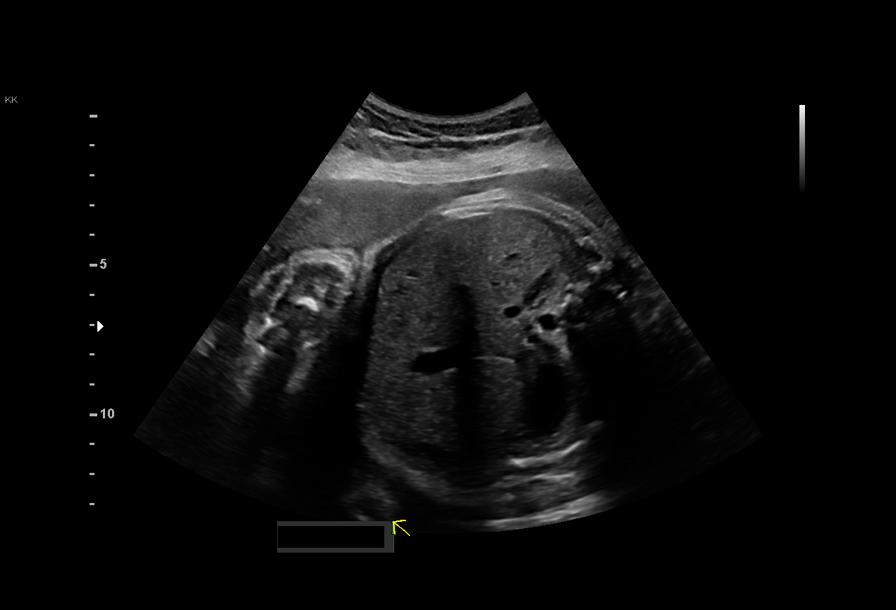
[im 9/48]
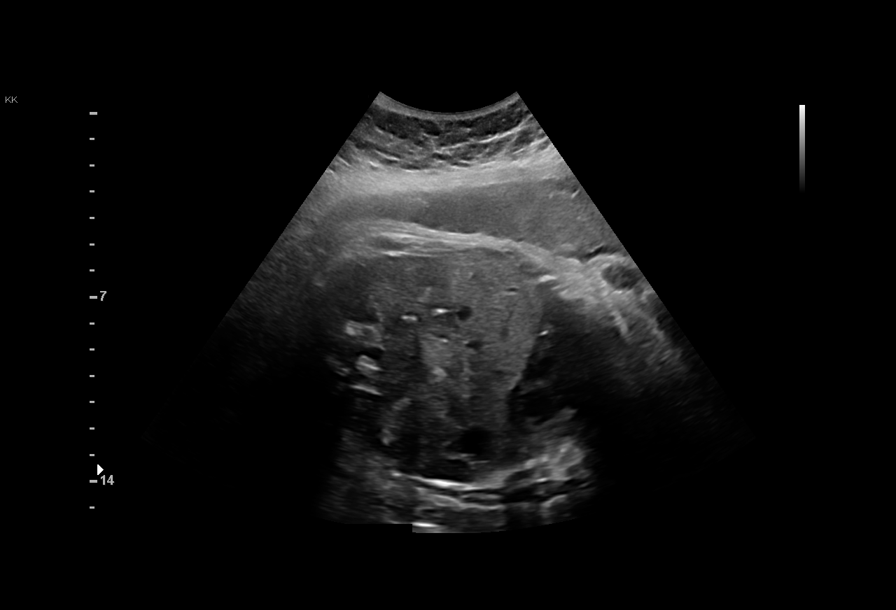
[im 14/48]
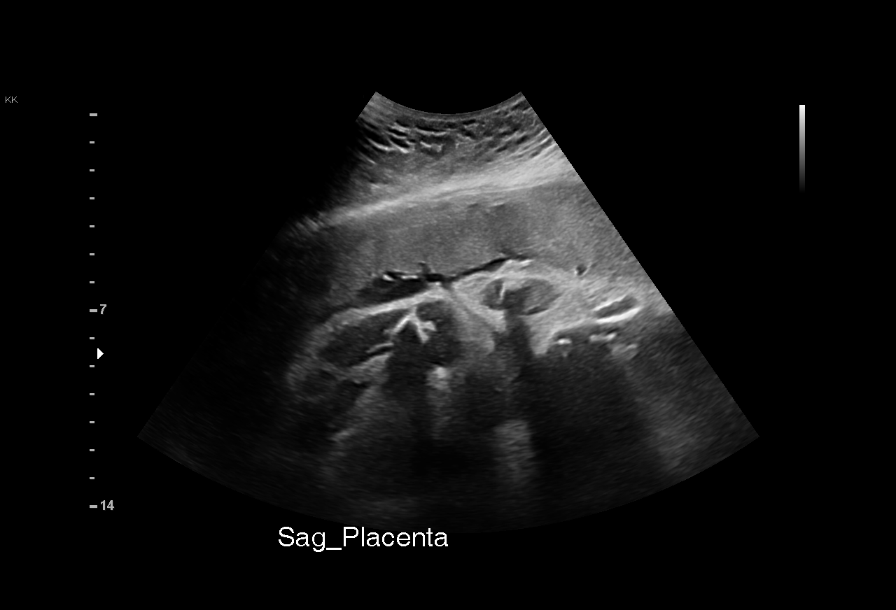
[im 18/48]
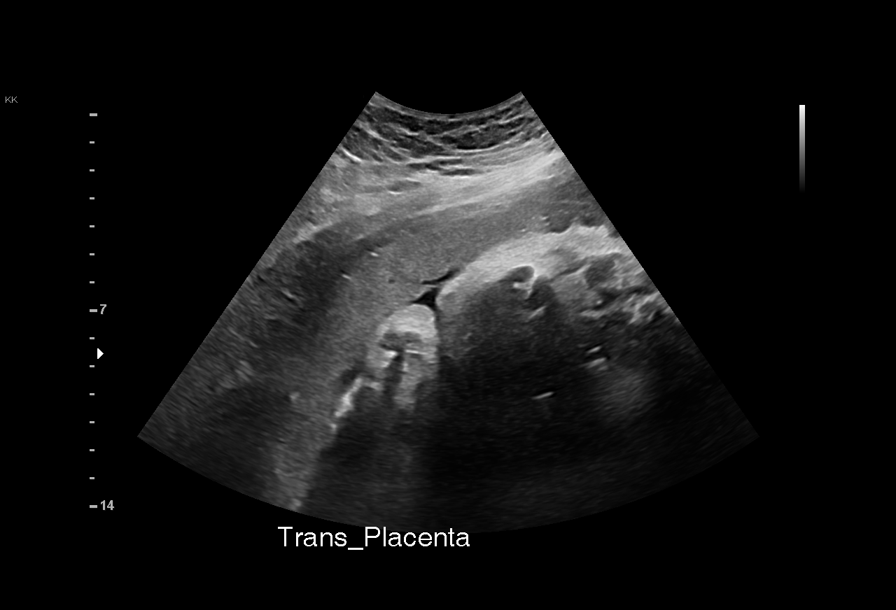
[im 21/48]
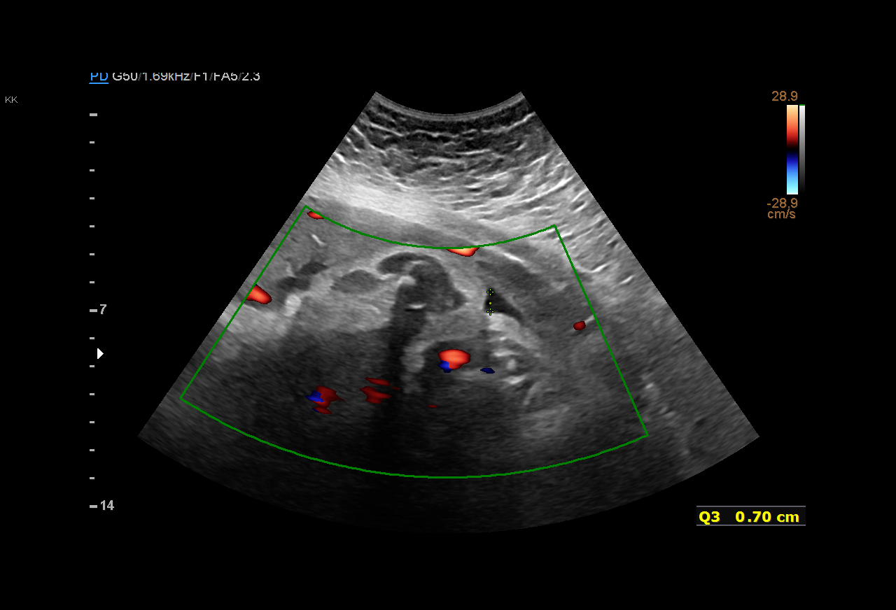
[im 27/48]
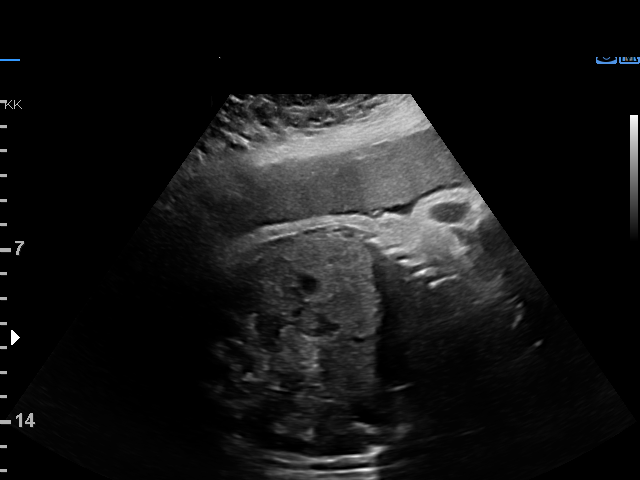
[im 30/48]
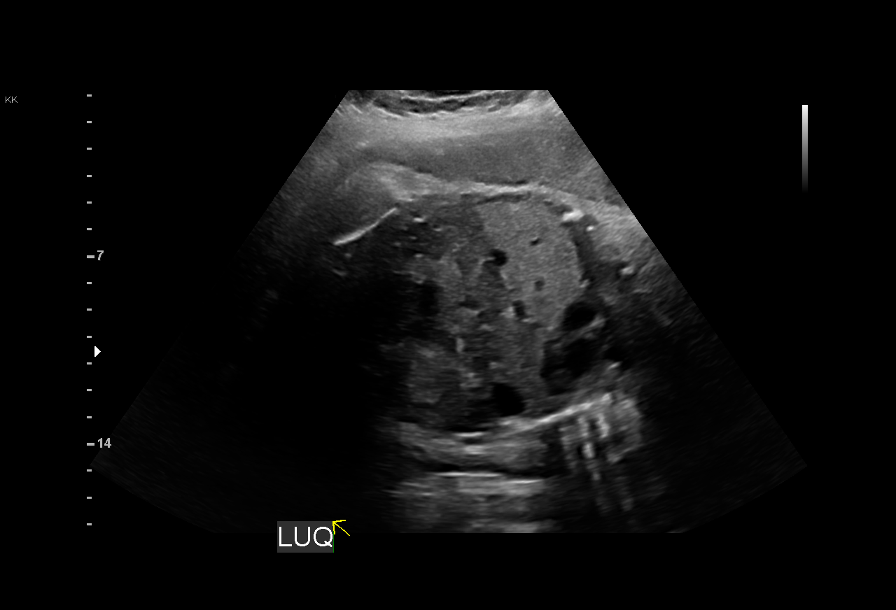
[im 34/48]
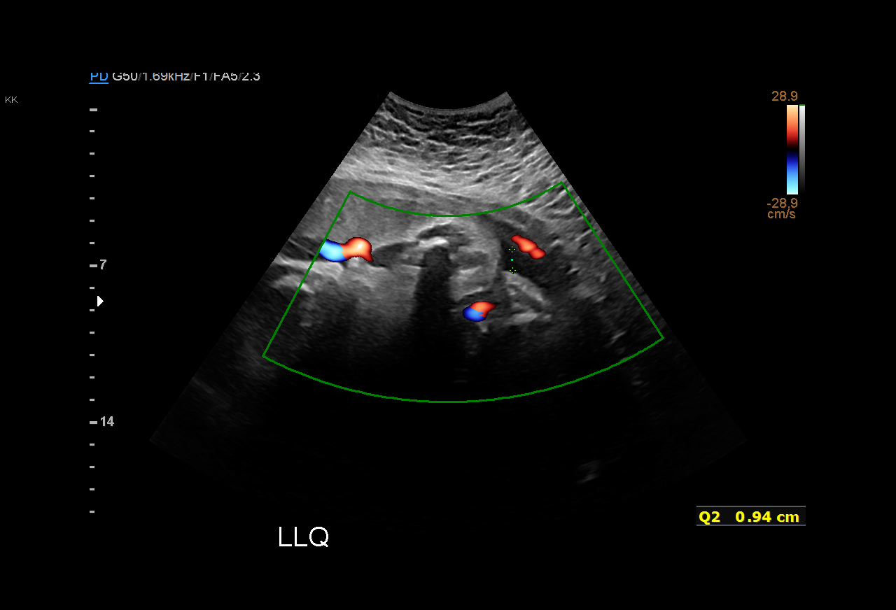
[im 39/48]
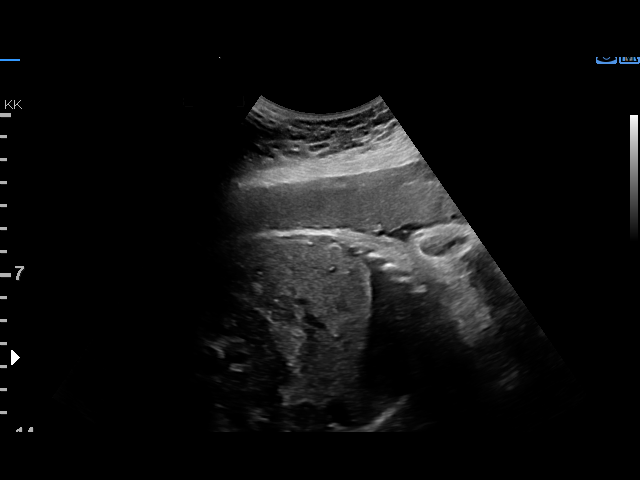
[im 42/48]
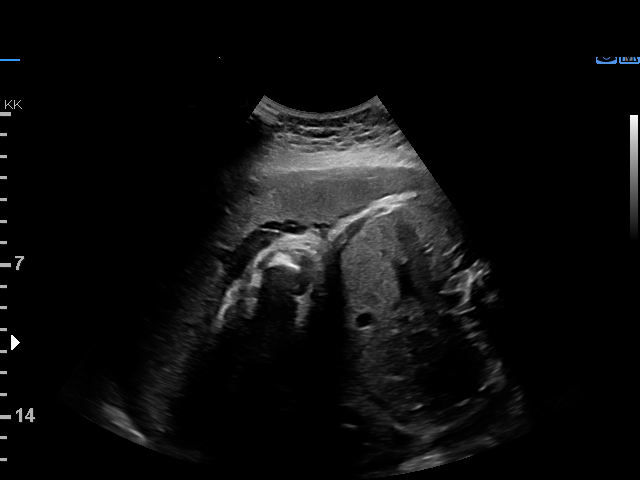
[im 46/48]
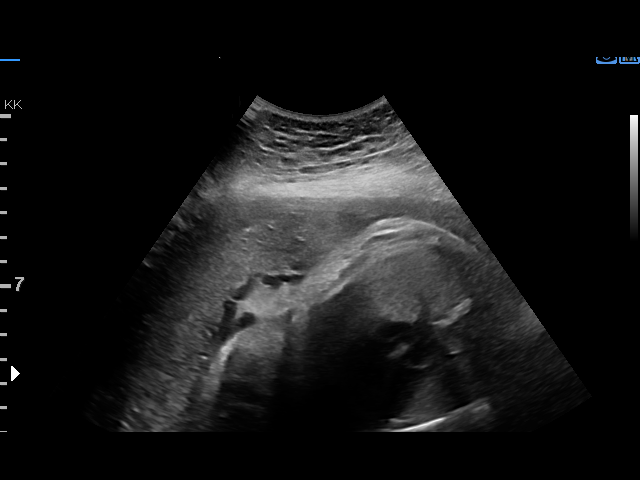

[12 of 28 positions shown; findings below may reference images not displayed]

SHEN

    SERFATY/NONSTRESS                                       MUZQUIZ

Indications

 38 weeks gestation of pregnancy
 Obesity complicating pregnancy, third
 trimester
 Hyperthyroid
 History of IUGR prior pregnancy
 Anxiety during pregnancy, second trimester     O99.342,
 Low Risk NIPS
Fetal Evaluation

 Num Of Fetuses:         1
 Fetal Heart Rate(bpm):  143
 Cardiac Activity:       Observed
 Presentation:           Cephalic
 Placenta:               Anterior
 P. Cord Insertion:      Previously Visualized

 Amniotic Fluid
 AFI FV:      Subjectively decreased

 AFI Sum(cm)     %Tile       Largest Pocket(cm)
 5.1             < 3

 RUQ(cm)                     LUQ(cm)        LLQ(cm)

Biophysical Evaluation
 Amniotic F.V:   Pocket => 2 cm             F. Tone:        Not Observed
 F. Movement:    Observed                   Score:          [DATE]
 F. Breathing:   Observed
Gestational Age

 LMP:           38w 3d        Date:  07/29/20                 EDD:   05/05/21
 Best:          38w 3d     Det. By:  LMP  (07/29/20)          EDD:   05/05/21
Anatomy

 Diaphragm:             Appears normal         Bladder:                Appears normal
 Stomach:               Appears normal, left
                        sided
Comments

 Ms. Munguambe is here for antenatal testing performed given
 maternal hyperthyroidism and FGR in the pregnancy.

 She seen at the request of Dr. Mofte Tmg

 The biophysical profile was [DATE] with good fetal movement
 and subjectively decreased amniotic fluid.

 Given decreased amniotic fluid volume and BPP [DATE]
 recommend consideration for delivery.

 I discussed with today's findings with Dr. Klaus (who is the
 provider on call) and he agreed to have Ms. Munguambe move
 toward delivery.

 She is going to go home and then to the hospital.

 I spent 20 minutes with > 50% in face to face consultation.

## 2023-11-14 ENCOUNTER — Telehealth: Admitting: Nurse Practitioner

## 2023-11-14 ENCOUNTER — Encounter: Payer: Self-pay | Admitting: Nurse Practitioner

## 2023-11-14 VITALS — Resp 16 | Ht 60.0 in | Wt 146.0 lb

## 2023-11-14 DIAGNOSIS — F900 Attention-deficit hyperactivity disorder, predominantly inattentive type: Secondary | ICD-10-CM

## 2023-11-14 DIAGNOSIS — F411 Generalized anxiety disorder: Secondary | ICD-10-CM | POA: Diagnosis not present

## 2023-11-14 DIAGNOSIS — Z6828 Body mass index (BMI) 28.0-28.9, adult: Secondary | ICD-10-CM

## 2023-11-14 DIAGNOSIS — E0500 Thyrotoxicosis with diffuse goiter without thyrotoxic crisis or storm: Secondary | ICD-10-CM

## 2023-11-14 DIAGNOSIS — E663 Overweight: Secondary | ICD-10-CM

## 2023-11-14 MED ORDER — AMPHETAMINE-DEXTROAMPHET ER 10 MG PO CP24
10.0000 mg | ORAL_CAPSULE | Freq: Every day | ORAL | 0 refills | Status: DC
Start: 2023-11-14 — End: 2023-12-06

## 2023-11-14 MED ORDER — WEGOVY 1 MG/0.5ML ~~LOC~~ SOAJ
1.0000 mg | SUBCUTANEOUS | 5 refills | Status: DC
Start: 2023-11-14 — End: 2024-02-14

## 2023-11-14 NOTE — Progress Notes (Signed)
 [facility] [address] [place], [place] [address]  Internal MEDICINE  Telephone Visit  Patient Name: Debra Hinton [facility]  [id number]  [id number]  Date of Service: 11/14/2023  I connected with the patient at 1155 by telephone and verified the patients identity using two identifiers.   I discussed the limitations, risks, security and privacy concerns of performing an evaluation and management service by telephone and the availability of in person appointments. I also discussed with the patient that there may be a patient responsible charge related to the service.  The patient expressed understanding and agrees to proceed.    Chief Complaint  Patient presents with   Telephone Screen    Med refills.    Telephone Assessment    HPI Falesha presents for a telehealth virtual visit for weight loss and medication refills. Virtual appointment ok due to patient has several small children and does not have any child care to watch them  so she can come in for a visit.  Weight loss --down 10 lbs but stalled on her weight loss, is ready to go up on the dose of the wegovy .  ADHD -- tried strattera but did not tolerate side effects. Tried bupropion but this worsened her depressive symptoms. Her therapist told her she needs to try a stimulant now but that they cannot prescribed it since she only sees them via telehealth.    Current Medication: Outpatient Encounter Medications as of 11/14/2023  Medication Sig   amphetamine-dextroamphetamine (ADDERALL XR) 10 MG 24 hr capsule Take 1 capsule (10 mg total) by mouth daily.   Semaglutide -Weight Management (WEGOVY ) 1 MG/0.5ML SOAJ Inject 1 mg into the skin once a week.   FLUoxetine (PROZAC) 40 MG capsule Take 40 mg by mouth daily.   ibuprofen  (ADVIL ) 600 MG tablet Take 1 tablet (600 mg total) by mouth every 6 (six) hours.   methimazole  (TAPAZOLE ) 5 MG tablet Take 2 tablets (10 mg total) by mouth daily.   norethindrone  (MICRONOR ) 0.35 MG tablet  Take 1 tablet by mouth daily.   [DISCONTINUED] atomoxetine (STRATTERA) 40 MG capsule Take 40 mg by mouth every morning. (Patient not taking: Reported on 09/13/2023)   [DISCONTINUED] buPROPion (WELLBUTRIN XL) 150 MG 24 hr tablet Take 150 mg by mouth daily.   [DISCONTINUED] MY WAY 1.5 MG tablet Take by mouth. (Patient not taking: Reported on 09/13/2023)   [DISCONTINUED] Semaglutide -Weight Management 0.5 MG/0.5ML SOAJ Inject 0.5 mg into the skin once a week.   No facility-administered encounter medications on file as of 11/14/2023.    Surgical History: Past Surgical History:  Procedure Laterality Date   NO PAST SURGERIES      Medical History: Past Medical History:  Diagnosis Date   Anxiety    Anxiety and depression    Depressive disorder    GERD (gastroesophageal reflux disease)    Gestational hypertension, third trimester    Hyperthyroidism 2019   Possible Graves disease   IBS (irritable bowel syndrome)    Irritable bowel syndrome (IBS)    Thyroid  disease     Family History: Family History  Problem Relation Age of Onset   Breast cancer Other         mggm   Hypertension Mother    Hyperlipidemia Father    Thyroid  disease Maternal Grandmother    Dementia Maternal Grandmother    Heart attack Maternal Grandfather    Diabetes Paternal Grandmother    Alcoholism Paternal Grandmother    Cancer Paternal Grandfather  pancreatic   Healthy Brother    Hypothyroidism Maternal Aunt    Hypothyroidism Maternal Aunt    Breast cancer Maternal Aunt        30s    Social History   Socioeconomic History   Marital status: Married    Spouse name: Eva   Number of children: 1   Years of education: Not on file   Highest education level: Not on file  Occupational History   Not on file  Tobacco Use   Smoking status: Former    Current packs/day: 0.00    Types: Cigarettes    Quit date: 07/03/2018    Years since quitting: 5.3   Smokeless tobacco: Never  Vaping Use   Vaping  status: Former  Substance and Sexual Activity   Alcohol use: Not Currently    Comment: 2 drinks/month   Drug use: Never    Types: Marijuana   Sexual activity: Yes    Birth control/protection: None  Other Topics Concern   Not on file  Social History Narrative   ** Merged History Encounter **       Social Drivers of Corporate investment banker Strain: Not on file  Food Insecurity: Not on file  Transportation Needs: Not on file  Physical Activity: Not on file  Stress: Not on file  Social Connections: Not on file  Intimate Partner Violence: Not on file      Review of Systems  Constitutional:  Positive for fatigue and unexpected weight change.  Respiratory: Negative.  Negative for cough, chest tightness, shortness of breath and wheezing.   Cardiovascular: Negative.  Negative for chest pain and palpitations.  Gastrointestinal: Negative.   Psychiatric/Behavioral:  Positive for behavioral problems and decreased concentration. Negative for self-injury, sleep disturbance and suicidal ideas. The patient is nervous/anxious.     Vital Signs: Resp 16   Ht 5' (1.524 m)   Wt 146 lb (66.2 kg)   BMI 28.51 kg/m    Observation/Objective: She is alert and oriented. No acute distress noted.     Assessment/Plan: 1. Graves disease (Primary) Continue to see endocrinology and continue methimazole  as prescribed   2. Overweight with body mass index (BMI) of 28 to 28.9 in adult Wegovy  dose increased, continue as prescribed.  - Semaglutide -Weight Management (WEGOVY ) 1 MG/0.5ML SOAJ; Inject 1 mg into the skin once a week.  Dispense: 2 mL; Refill: 5  3. Generalized anxiety disorder Continue fluoxetine as prescribed.   4. ADHD (attention deficit hyperactivity disorder), inattentive type Will try adderall xr as prescribed. Follow up in a few weeks in person  - amphetamine-dextroamphetamine (ADDERALL XR) 10 MG 24 hr capsule; Take 1 capsule (10 mg total) by mouth daily.  Dispense: 30  capsule; Refill: 0   General Counseling: Alieah verbalizes understanding of the findings of today's phone visit and agrees with plan of treatment. I have discussed any further diagnostic evaluation that may be needed or ordered today. We also reviewed her medications today. she has been encouraged to call the office with any questions or concerns that should arise related to todays visit.  Return in about 22 days (around 12/06/2023) for F/U, ADHD med check, Azalia Neuberger PCP, eval new med.   No orders of the defined types were placed in this encounter.   Meds ordered this encounter  Medications   Semaglutide -Weight Management (WEGOVY ) 1 MG/0.5ML SOAJ    Sig: Inject 1 mg into the skin once a week.    Dispense:  2 mL    Refill:  5   amphetamine-dextroamphetamine (ADDERALL XR) 10 MG 24 hr capsule    Sig: Take 1 capsule (10 mg total) by mouth daily.    Dispense:  30 capsule    Refill:  0    Fill new script today    Time spent:20 Minutes Time spent with patient included reviewing progress notes, labs, imaging studies, and discussing plan for follow up.  [place] Controlled Substance Database was reviewed by me for overdose risk score (ORS) if appropriate.  This patient was seen by Mardy Maxin, FNP-C in collaboration with Dr. Sigrid Bathe as a part of collaborative care agreement.  Kingsley Farace R. Maxin, MSN, FNP-C Internal medicine

## 2023-12-06 ENCOUNTER — Encounter: Payer: Self-pay | Admitting: Nurse Practitioner

## 2023-12-06 ENCOUNTER — Ambulatory Visit: Admitting: Nurse Practitioner

## 2023-12-06 VITALS — BP 120/78 | HR 92 | Temp 98.2°F | Resp 16 | Ht 60.0 in | Wt 142.6 lb

## 2023-12-06 DIAGNOSIS — F411 Generalized anxiety disorder: Secondary | ICD-10-CM

## 2023-12-06 DIAGNOSIS — E663 Overweight: Secondary | ICD-10-CM

## 2023-12-06 DIAGNOSIS — Z6827 Body mass index (BMI) 27.0-27.9, adult: Secondary | ICD-10-CM

## 2023-12-06 DIAGNOSIS — E0500 Thyrotoxicosis with diffuse goiter without thyrotoxic crisis or storm: Secondary | ICD-10-CM

## 2023-12-06 DIAGNOSIS — F900 Attention-deficit hyperactivity disorder, predominantly inattentive type: Secondary | ICD-10-CM | POA: Diagnosis not present

## 2023-12-06 MED ORDER — AMPHETAMINE-DEXTROAMPHET ER 10 MG PO CP24
10.0000 mg | ORAL_CAPSULE | Freq: Every day | ORAL | 0 refills | Status: DC
Start: 2023-12-06 — End: 2023-12-28

## 2023-12-06 MED ORDER — AMPHETAMINE-DEXTROAMPHET ER 10 MG PO CP24
10.0000 mg | ORAL_CAPSULE | Freq: Every day | ORAL | 0 refills | Status: DC
Start: 2024-01-03 — End: 2023-12-28

## 2023-12-06 MED ORDER — AMPHETAMINE-DEXTROAMPHET ER 10 MG PO CP24
10.0000 mg | ORAL_CAPSULE | Freq: Every day | ORAL | 0 refills | Status: DC
Start: 2024-01-31 — End: 2023-12-28

## 2023-12-06 NOTE — Progress Notes (Signed)
 [facility] [address] [place], [place] [address]  Internal MEDICINE  Office Visit Note  Patient Name: Debra Hinton  [id number]  [id number]  Date of Service: 12/06/2023  Chief Complaint  Patient presents with   Depression   Gastroesophageal Reflux   Hypertension   Follow-up    HPI Debra Hinton presents for a follow-up visit for ADHD and weight loss  ADHD -- diagnosed by psychologist and was tried on strattera last year, then tried wellbutrin, now switched to adderall and we are evaluating this dose which is working well. Denies any palpitations or other adverse side effects of the medication. BP and heart rate are normal. Current dose is effective  Weight loss -- down to 142 lbs today.     Current Medication: Outpatient Encounter Medications as of 12/06/2023  Medication Sig   amphetamine -dextroamphetamine (ADDERALL XR) 10 MG 24 hr capsule Take 1 capsule (10 mg total) by mouth daily.   [START ON 01/03/2024] amphetamine -dextroamphetamine (ADDERALL XR) 10 MG 24 hr capsule Take 1 capsule (10 mg total) by mouth daily.   [START ON 01/31/2024] amphetamine -dextroamphetamine (ADDERALL XR) 10 MG 24 hr capsule Take 1 capsule (10 mg total) by mouth daily.   FLUoxetine (PROZAC) 40 MG capsule Take 40 mg by mouth daily.   ibuprofen  (ADVIL ) 600 MG tablet Take 1 tablet (600 mg total) by mouth every 6 (six) hours.   methimazole  (TAPAZOLE ) 5 MG tablet Take 2 tablets (10 mg total) by mouth daily.   norethindrone  (MICRONOR ) 0.35 MG tablet Take 1 tablet by mouth daily.   Semaglutide -Weight Management (WEGOVY ) 1 MG/0.5ML SOAJ Inject 1 mg into the skin once a week.   [DISCONTINUED] amphetamine -dextroamphetamine (ADDERALL XR) 10 MG 24 hr capsule Take 1 capsule (10 mg total) by mouth daily.   No facility-administered encounter medications on file as of 12/06/2023.    Surgical History: Past Surgical History:  Procedure Laterality Date   NO PAST SURGERIES      Medical History: Past  Medical History:  Diagnosis Date   Anxiety    Anxiety and depression    Depressive disorder    GERD (gastroesophageal reflux disease)    Gestational hypertension, third trimester    Hyperthyroidism 2019   Possible Graves disease   IBS (irritable bowel syndrome)    Irritable bowel syndrome (IBS)    Thyroid  disease     Family History: Family History  Problem Relation Age of Onset   Breast cancer Other         mggm   Hypertension Mother    Hyperlipidemia Father    Thyroid  disease Maternal Grandmother    Dementia Maternal Grandmother    Heart attack Maternal Grandfather    Diabetes Paternal Grandmother    Alcoholism Paternal Grandmother    Cancer Paternal Grandfather        pancreatic   Healthy Brother    Hypothyroidism Maternal Aunt    Hypothyroidism Maternal Aunt    Breast cancer Maternal Aunt        30s    Social History   Socioeconomic History   Marital status: Married    Spouse name: Eva   Number of children: 1   Years of education: Not on file   Highest education level: Not on file  Occupational History   Not on file  Tobacco Use   Smoking status: Former    Current packs/day: 0.00    Types: Cigarettes    Quit date: 07/03/2018    Years since quitting: 5.4   Smokeless  tobacco: Never  Vaping Use   Vaping status: Former  Substance and Sexual Activity   Alcohol use: Not Currently    Comment: 2 drinks/month   Drug use: Never    Types: Marijuana   Sexual activity: Yes    Birth control/protection: None  Other Topics Concern   Not on file  Social History Narrative   ** Merged History Encounter **       Social Drivers of Corporate investment banker Strain: Not on file  Food Insecurity: Not on file  Transportation Needs: Not on file  Physical Activity: Not on file  Stress: Not on file  Social Connections: Not on file  Intimate Partner Violence: Not on file      Review of Systems  Constitutional:  Positive for appetite change, fatigue and  unexpected weight change. Negative for chills.  HENT:  Negative for congestion, postnasal drip, rhinorrhea, sneezing and sore throat.   Eyes:  Negative for redness.  Respiratory:  Negative for cough, chest tightness and shortness of breath.   Cardiovascular: Negative.  Negative for chest pain and palpitations.  Gastrointestinal:  Negative for abdominal pain, constipation, diarrhea, nausea and vomiting.  Genitourinary:  Negative for dysuria and frequency.  Musculoskeletal:  Negative for arthralgias, back pain, joint swelling and neck pain.  Skin:  Negative for rash.  Neurological: Negative.  Negative for tremors and numbness.  Hematological:  Negative for adenopathy. Does not bruise/bleed easily.  Psychiatric/Behavioral:  Positive for decreased concentration. Negative for behavioral problems (Depression), sleep disturbance and suicidal ideas. The patient is not nervous/anxious.     Vital Signs: BP 120/78   Pulse 92   Temp 98.2 F (36.8 C)   Resp 16   Ht 5' (1.524 m)   Wt 142 lb 9.6 oz (64.7 kg)   SpO2 95%   BMI 27.85 kg/m    Physical Exam Vitals reviewed.  Constitutional:      General: She is not in acute distress.    Appearance: Normal appearance. She is not ill-appearing.  HENT:     Head: Normocephalic and atraumatic.  Eyes:     Pupils: Pupils are equal, round, and reactive to light.  Cardiovascular:     Rate and Rhythm: Normal rate and regular rhythm.  Pulmonary:     Effort: Pulmonary effort is normal. No respiratory distress.  Skin:    General: Skin is warm and dry.     Capillary Refill: Capillary refill takes less than 2 seconds.  Neurological:     Mental Status: She is alert and oriented to person, place, and time.  Psychiatric:        Mood and Affect: Mood normal.        Behavior: Behavior normal.        Assessment/Plan: 1. Graves disease (Primary) Continue methimazole  as prescribed and follow up with endocrinology.   2. Overweight with body mass index  (BMI) of 27 to 27.9 in adult Continue wegovy  as prescribed.   3. ADHD (attention deficit hyperactivity disorder), inattentive type Continue adderall as prescribed. Follow up in 3 months for additional refills.  - amphetamine -dextroamphetamine (ADDERALL XR) 10 MG 24 hr capsule; Take 1 capsule (10 mg total) by mouth daily.  Dispense: 30 capsule; Refill: 0 - amphetamine -dextroamphetamine (ADDERALL XR) 10 MG 24 hr capsule; Take 1 capsule (10 mg total) by mouth daily.  Dispense: 30 capsule; Refill: 0 - amphetamine -dextroamphetamine (ADDERALL XR) 10 MG 24 hr capsule; Take 1 capsule (10 mg total) by mouth daily.  Dispense: 30 capsule;  Refill: 0  4. Generalized anxiety disorder Continue fluoxetine as prescribed.    General Counseling: Debra Hinton verbalizes understanding of the findings of todays visit and agrees with plan of treatment. I have discussed any further diagnostic evaluation that may be needed or ordered today. We also reviewed her medications today. she has been encouraged to call the office with any questions or concerns that should arise related to todays visit.    No orders of the defined types were placed in this encounter.   Meds ordered this encounter  Medications   amphetamine -dextroamphetamine (ADDERALL XR) 10 MG 24 hr capsule    Sig: Take 1 capsule (10 mg total) by mouth daily.    Dispense:  30 capsule    Refill:  0    Fill for july   amphetamine -dextroamphetamine (ADDERALL XR) 10 MG 24 hr capsule    Sig: Take 1 capsule (10 mg total) by mouth daily.    Dispense:  30 capsule    Refill:  0    Fill for august   amphetamine -dextroamphetamine (ADDERALL XR) 10 MG 24 hr capsule    Sig: Take 1 capsule (10 mg total) by mouth daily.    Dispense:  30 capsule    Refill:  0    Fill for September    Return in about 3 months (around 02/29/2024) for F/U, ADHD med check, Venus Gilles PCP, Weight loss.   Total time spent:30 Minutes Time spent includes review of chart, medications, test  results, and follow up plan with the patient.   [place] Controlled Substance Database was reviewed by me.  This patient was seen by Mardy Maxin, FNP-C in collaboration with Dr. Sigrid Bathe as a part of collaborative care agreement.   Robben Jagiello R. Maxin, MSN, FNP-C Internal medicine

## 2023-12-07 ENCOUNTER — Encounter: Payer: Self-pay | Admitting: Nurse Practitioner

## 2023-12-26 ENCOUNTER — Other Ambulatory Visit: Payer: Self-pay

## 2023-12-26 ENCOUNTER — Telehealth: Payer: Self-pay

## 2023-12-26 DIAGNOSIS — E663 Overweight: Secondary | ICD-10-CM

## 2023-12-28 MED ORDER — AMPHETAMINE-DEXTROAMPHET ER 15 MG PO CP24: 15.0000 mg | ORAL_CAPSULE | ORAL | 0 refills | Status: DC

## 2023-12-28 NOTE — Telephone Encounter (Signed)
 Pt notified that we increased adderall and pres sent

## 2024-02-14 ENCOUNTER — Encounter: Payer: Self-pay | Admitting: Nurse Practitioner

## 2024-02-14 ENCOUNTER — Ambulatory Visit: Admitting: Nurse Practitioner

## 2024-02-14 VITALS — BP 111/74 | HR 101 | Temp 96.9°F | Resp 16 | Ht 60.0 in | Wt 136.2 lb

## 2024-02-14 DIAGNOSIS — Z8379 Family history of other diseases of the digestive system: Secondary | ICD-10-CM

## 2024-02-14 DIAGNOSIS — Z833 Family history of diabetes mellitus: Secondary | ICD-10-CM

## 2024-02-14 DIAGNOSIS — E663 Overweight: Secondary | ICD-10-CM | POA: Diagnosis not present

## 2024-02-14 DIAGNOSIS — F900 Attention-deficit hyperactivity disorder, predominantly inattentive type: Secondary | ICD-10-CM

## 2024-02-14 DIAGNOSIS — E0500 Thyrotoxicosis with diffuse goiter without thyrotoxic crisis or storm: Secondary | ICD-10-CM

## 2024-02-14 DIAGNOSIS — Z6828 Body mass index (BMI) 28.0-28.9, adult: Secondary | ICD-10-CM

## 2024-02-14 MED ORDER — WEGOVY 1 MG/0.5ML ~~LOC~~ SOAJ: 1.0000 mg | SUBCUTANEOUS | 5 refills | Status: DC

## 2024-02-14 MED ORDER — AMPHETAMINE-DEXTROAMPHET ER 15 MG PO CP24: 15.0000 mg | ORAL_CAPSULE | ORAL | 0 refills | Status: DC

## 2024-02-14 NOTE — Progress Notes (Signed)
 [facility] [address] [place], [place] [address]  Internal MEDICINE  Office Visit Note  Patient Name: Debra Hinton [facility]  [id number]  [id number]  Date of Service: 02/14/2024  Chief Complaint  Patient presents with   Depression   Gastroesophageal Reflux   Hypertension   Follow-up    HPI Debra Hinton presents for a follow-up visit for weight loss, ADHD and medication refills.  Medicaid coverage changing for wegovy ? RUQ us  is normal last year.  Patient has family history of type 2 diabetes and liver disease.  Due for refills, lost 9 more lbs since last visit.  Due for adderall refills, current dose is effective. BP and heart rate are normal. Denies any palpitations or other adverse side effects.      Current Medication: Outpatient Encounter Medications as of 02/14/2024  Medication Sig   amphetamine -dextroamphetamine (ADDERALL XR) 15 MG 24 hr capsule Take 1 capsule by mouth every morning.   [START ON 03/13/2024] amphetamine -dextroamphetamine (ADDERALL XR) 15 MG 24 hr capsule Take 1 capsule by mouth every morning.   [START ON 04/10/2024] amphetamine -dextroamphetamine (ADDERALL XR) 15 MG 24 hr capsule Take 1 capsule by mouth every morning.   FLUoxetine (PROZAC) 40 MG capsule Take 40 mg by mouth daily.   ibuprofen  (ADVIL ) 600 MG tablet Take 1 tablet (600 mg total) by mouth every 6 (six) hours.   methimazole  (TAPAZOLE ) 5 MG tablet Take 2 tablets (10 mg total) by mouth daily.   norethindrone  (MICRONOR ) 0.35 MG tablet Take 1 tablet by mouth daily.   semaglutide -weight management (WEGOVY ) 1 MG/0.5ML SOAJ SQ injection Inject 1 mg into the skin once a week.   [DISCONTINUED] amphetamine -dextroamphetamine (ADDERALL XR) 15 MG 24 hr capsule Take 1 capsule by mouth every morning.   [DISCONTINUED] Semaglutide -Weight Management (WEGOVY ) 1 MG/0.5ML SOAJ Inject 1 mg into the skin once a week.   No facility-administered encounter medications on file as of 02/14/2024.    Surgical  History: Past Surgical History:  Procedure Laterality Date   NO PAST SURGERIES      Medical History: Past Medical History:  Diagnosis Date   Anxiety    Anxiety and depression    Depressive disorder    GERD (gastroesophageal reflux disease)    Gestational hypertension, third trimester    Hyperthyroidism 2019   Possible Graves disease   IBS (irritable bowel syndrome)    Irritable bowel syndrome (IBS)    Thyroid  disease     Family History: Family History  Problem Relation Age of Onset   Breast cancer Other         mggm   Hypertension Mother    Hyperlipidemia Father    Thyroid  disease Maternal Grandmother    Dementia Maternal Grandmother    Heart attack Maternal Grandfather    Diabetes Paternal Grandmother    Alcoholism Paternal Grandmother    Cancer Paternal Grandfather        pancreatic   Healthy Brother    Hypothyroidism Maternal Aunt    Hypothyroidism Maternal Aunt    Breast cancer Maternal Aunt        30s    Social History   Socioeconomic History   Marital status: Married    Spouse name: Eva   Number of children: 1   Years of education: Not on file   Highest education level: Not on file  Occupational History   Not on file  Tobacco Use   Smoking status: Former    Current packs/day: 0.00    Types: Cigarettes  Quit date: 07/03/2018    Years since quitting: 5.6   Smokeless tobacco: Never  Vaping Use   Vaping status: Former  Substance and Sexual Activity   Alcohol use: Not Currently    Comment: 2 drinks/month   Drug use: Never    Types: Marijuana   Sexual activity: Yes    Birth control/protection: None  Other Topics Concern   Not on file  Social History Narrative   ** Merged History Encounter **       Social Drivers of Corporate investment banker Strain: Not on file  Food Insecurity: Not on file  Transportation Needs: Not on file  Physical Activity: Not on file  Stress: Not on file  Social Connections: Not on file  Intimate Partner  Violence: Not on file      Review of Systems  Constitutional:  Positive for appetite change, fatigue and unexpected weight change. Negative for chills.  HENT:  Negative for congestion, postnasal drip, rhinorrhea, sneezing and sore throat.   Eyes:  Negative for redness.  Respiratory:  Negative for cough, chest tightness and shortness of breath.   Cardiovascular: Negative.  Negative for chest pain and palpitations.  Gastrointestinal:  Negative for abdominal pain, constipation, diarrhea, nausea and vomiting.  Genitourinary:  Negative for dysuria and frequency.  Musculoskeletal:  Negative for arthralgias, back pain, joint swelling and neck pain.  Skin:  Negative for rash.  Neurological: Negative.  Negative for tremors and numbness.  Hematological:  Negative for adenopathy. Does not bruise/bleed easily.  Psychiatric/Behavioral:  Positive for decreased concentration. Negative for behavioral problems (Depression), sleep disturbance and suicidal ideas. The patient is not nervous/anxious.     Vital Signs: BP 111/74   Pulse (!) 101   Temp (!) 96.9 F (36.1 C)   Resp 16   Ht 5' (1.524 m)   Wt 136 lb 3.2 oz (61.8 kg)   SpO2 96%   BMI 26.60 kg/m    Physical Exam Vitals reviewed.  Constitutional:      General: She is not in acute distress.    Appearance: Normal appearance. She is not ill-appearing.  HENT:     Head: Normocephalic and atraumatic.  Eyes:     Pupils: Pupils are equal, round, and reactive to light.  Cardiovascular:     Rate and Rhythm: Normal rate and regular rhythm.  Pulmonary:     Effort: Pulmonary effort is normal. No respiratory distress.  Skin:    General: Skin is warm and dry.     Capillary Refill: Capillary refill takes less than 2 seconds.  Neurological:     Mental Status: She is alert and oriented to person, place, and time.  Psychiatric:        Mood and Affect: Mood normal.        Behavior: Behavior normal.        Assessment/Plan: 1. Graves disease  (Primary) Continue to follow up with endocrinology  2. Overweight with body mass index (BMI) of 28 to 28.9 in adult Continue wegovy  as prescribed  - semaglutide -weight management (WEGOVY ) 1 MG/0.5ML SOAJ SQ injection; Inject 1 mg into the skin once a week.  Dispense: 2 mL; Refill: 5  3. Family history of diabetes mellitus (DM) Noted   4. Family history of liver disease From mother  19. ADHD (attention deficit hyperactivity disorder), inattentive type Continue adderall as prescribed, follow up in 3 months for additional refills.  - amphetamine -dextroamphetamine (ADDERALL XR) 15 MG 24 hr capsule; Take 1 capsule by mouth every morning.  Dispense: 30 capsule; Refill: 0 - amphetamine -dextroamphetamine (ADDERALL XR) 15 MG 24 hr capsule; Take 1 capsule by mouth every morning.  Dispense: 30 capsule; Refill: 0 - amphetamine -dextroamphetamine (ADDERALL XR) 15 MG 24 hr capsule; Take 1 capsule by mouth every morning.  Dispense: 30 capsule; Refill: 0   General Counseling: Debra Hinton verbalizes understanding of the findings of todays visit and agrees with plan of treatment. I have discussed any further diagnostic evaluation that may be needed or ordered today. We also reviewed her medications today. she has been encouraged to call the office with any questions or concerns that should arise related to todays visit.    No orders of the defined types were placed in this encounter.   Meds ordered this encounter  Medications   semaglutide -weight management (WEGOVY ) 1 MG/0.5ML SOAJ SQ injection    Sig: Inject 1 mg into the skin once a week.    Dispense:  2 mL    Refill:  5   amphetamine -dextroamphetamine (ADDERALL XR) 15 MG 24 hr capsule    Sig: Take 1 capsule by mouth every morning.    Dispense:  30 capsule    Refill:  0    For September fill   amphetamine -dextroamphetamine (ADDERALL XR) 15 MG 24 hr capsule    Sig: Take 1 capsule by mouth every morning.    Dispense:  30 capsule    Refill:  0    Fill  for October.   amphetamine -dextroamphetamine (ADDERALL XR) 15 MG 24 hr capsule    Sig: Take 1 capsule by mouth every morning.    Dispense:  30 capsule    Refill:  0    Fill for november    Return in about 3 months (around 05/09/2024) for F/U, ADHD med check, Debra Hinton PCP.   Total time spent:30 Minutes Time spent includes review of chart, medications, test results, and follow up plan with the patient.   [place] Controlled Substance Database was reviewed by me.  This patient was seen by Mardy Maxin, FNP-C in collaboration with Dr. Sigrid Bathe as a part of collaborative care agreement.   Donnisha Besecker R. Maxin, MSN, FNP-C Internal medicine

## 2024-02-29 ENCOUNTER — Ambulatory Visit: Admitting: Nurse Practitioner

## 2024-03-01 ENCOUNTER — Ambulatory Visit: Admitting: Nurse Practitioner

## 2024-03-07 ENCOUNTER — Encounter: Payer: Self-pay | Admitting: Nurse Practitioner

## 2024-03-07 ENCOUNTER — Ambulatory Visit: Admitting: Nurse Practitioner

## 2024-03-07 VITALS — BP 110/70 | HR 86 | Temp 96.4°F | Resp 16 | Ht 60.0 in | Wt 134.0 lb

## 2024-03-07 DIAGNOSIS — E0500 Thyrotoxicosis with diffuse goiter without thyrotoxic crisis or storm: Secondary | ICD-10-CM

## 2024-03-07 DIAGNOSIS — E663 Overweight: Secondary | ICD-10-CM

## 2024-03-07 DIAGNOSIS — F900 Attention-deficit hyperactivity disorder, predominantly inattentive type: Secondary | ICD-10-CM

## 2024-03-07 DIAGNOSIS — F411 Generalized anxiety disorder: Secondary | ICD-10-CM | POA: Diagnosis not present

## 2024-03-07 DIAGNOSIS — Z6826 Body mass index (BMI) 26.0-26.9, adult: Secondary | ICD-10-CM

## 2024-03-07 NOTE — Progress Notes (Signed)
 [facility] [address] [place], [place] [address]  Internal MEDICINE  Office Visit Note  Patient Name: Debra Hinton  [id number]  [id number]  Date of Service: 03/07/2024  Chief Complaint  Patient presents with   Depression   Gastroesophageal Reflux   Hypertension   Follow-up    HPI Debra Hinton presents for a follow-up visit for graves disease, weight loss, ADHD and anxiety.  Graves disease -- taking methimazole  and followed by endocrinology.  Overweight -- taking wegovy  and is doing well but her insurance will no longer cover the medication ADHD -- current adderall dose is effective. BP and heart rate are normal. She denies any palpitation or other adverse side effects. Due for refills.  Anxiety -- taking fluoxetine daily.    Current Medication: Outpatient Encounter Medications as of 03/07/2024  Medication Sig   amphetamine -dextroamphetamine (ADDERALL XR) 15 MG 24 hr capsule Take 1 capsule by mouth every morning.   amphetamine -dextroamphetamine (ADDERALL XR) 15 MG 24 hr capsule Take 1 capsule by mouth every morning.   [START ON 04/10/2024] amphetamine -dextroamphetamine (ADDERALL XR) 15 MG 24 hr capsule Take 1 capsule by mouth every morning.   FLUoxetine (PROZAC) 40 MG capsule Take 40 mg by mouth daily.   ibuprofen  (ADVIL ) 600 MG tablet Take 1 tablet (600 mg total) by mouth every 6 (six) hours.   methimazole  (TAPAZOLE ) 5 MG tablet Take 2 tablets (10 mg total) by mouth daily.   norethindrone  (MICRONOR ) 0.35 MG tablet Take 1 tablet by mouth daily.   [DISCONTINUED] semaglutide -weight management (WEGOVY ) 1 MG/0.5ML SOAJ SQ injection Inject 1 mg into the skin once a week.   No facility-administered encounter medications on file as of 03/07/2024.    Surgical History: Past Surgical History:  Procedure Laterality Date   NO PAST SURGERIES      Medical History: Past Medical History:  Diagnosis Date   Anxiety    Anxiety and depression    Depressive disorder     GERD (gastroesophageal reflux disease)    Gestational hypertension, third trimester    Hyperthyroidism 2019   Possible Graves disease   IBS (irritable bowel syndrome)    Irritable bowel syndrome (IBS)    Thyroid  disease     Family History: Family History  Problem Relation Age of Onset   Breast cancer Other         mggm   Hypertension Mother    Hyperlipidemia Father    Thyroid  disease Maternal Grandmother    Dementia Maternal Grandmother    Heart attack Maternal Grandfather    Diabetes Paternal Grandmother    Alcoholism Paternal Grandmother    Cancer Paternal Grandfather        pancreatic   Healthy Brother    Hypothyroidism Maternal Aunt    Hypothyroidism Maternal Aunt    Breast cancer Maternal Aunt        30s    Social History   Socioeconomic History   Marital status: Married    Spouse name: Eva   Number of children: 1   Years of education: Not on file   Highest education level: Not on file  Occupational History   Not on file  Tobacco Use   Smoking status: Former    Current packs/day: 0.00    Types: Cigarettes    Quit date: 07/03/2018    Years since quitting: 5.7   Smokeless tobacco: Never  Vaping Use   Vaping status: Former  Substance and Sexual Activity   Alcohol use: Not Currently    Comment:  2 drinks/month   Drug use: Never    Types: Marijuana   Sexual activity: Yes    Birth control/protection: None  Other Topics Concern   Not on file  Social History Narrative   ** Merged History Encounter **       Social Drivers of Corporate Investment Banker Strain: Not on file  Food Insecurity: Not on file  Transportation Needs: Not on file  Physical Activity: Not on file  Stress: Not on file  Social Connections: Not on file  Intimate Partner Violence: Not on file      Review of Systems  Constitutional:  Positive for appetite change, fatigue and unexpected weight change. Negative for chills.  HENT:  Negative for congestion, postnasal drip,  rhinorrhea, sneezing and sore throat.   Eyes:  Negative for redness.  Respiratory:  Negative for cough, chest tightness and shortness of breath.   Cardiovascular: Negative.  Negative for chest pain and palpitations.  Gastrointestinal:  Negative for abdominal pain, constipation, diarrhea, nausea and vomiting.  Genitourinary:  Negative for dysuria and frequency.  Musculoskeletal:  Negative for arthralgias, back pain, joint swelling and neck pain.  Skin:  Negative for rash.  Neurological: Negative.  Negative for tremors and numbness.  Hematological:  Negative for adenopathy. Does not bruise/bleed easily.  Psychiatric/Behavioral:  Positive for decreased concentration. Negative for behavioral problems (Depression), sleep disturbance and suicidal ideas. The patient is not nervous/anxious.     Vital Signs: BP 110/70   Pulse 86   Temp (!) 96.4 F (35.8 C)   Resp 16   Ht 5' (1.524 m)   Wt 134 lb (60.8 kg)   SpO2 98%   BMI 26.17 kg/m    Physical Exam Vitals reviewed.  Constitutional:      General: She is not in acute distress.    Appearance: Normal appearance. She is not ill-appearing.  HENT:     Head: Normocephalic and atraumatic.  Eyes:     Pupils: Pupils are equal, round, and reactive to light.  Cardiovascular:     Rate and Rhythm: Normal rate and regular rhythm.  Pulmonary:     Effort: Pulmonary effort is normal. No respiratory distress.  Skin:    General: Skin is warm and dry.     Capillary Refill: Capillary refill takes less than 2 seconds.  Neurological:     Mental Status: She is alert and oriented to person, place, and time.  Psychiatric:        Mood and Affect: Mood normal.        Behavior: Behavior normal.        Assessment/Plan: 1. Graves disease (Primary) Continue methimazole  as prescribed. Follow up with endocrinology.   2. Overweight with body mass index (BMI) of 26 to 26.9 in adult Continue wegovy  as prescribed.   3. ADHD (attention deficit  hyperactivity disorder), inattentive type Continue adderall as prescribed.   4. Generalized anxiety disorder Continue fluoxetine as prescribed.    General Counseling: Debra Hinton verbalizes understanding of the findings of todays visit and agrees with plan of treatment. I have discussed any further diagnostic evaluation that may be needed or ordered today. We also reviewed her medications today. she has been encouraged to call the office with any questions or concerns that should arise related to todays visit.    No orders of the defined types were placed in this encounter.   No orders of the defined types were placed in this encounter.   Return in about 2 months (around 05/07/2024)  for F/U, ADHD med check, Kurt Hoffmeier PCP.   Total time spent:30 Minutes Time spent includes review of chart, medications, test results, and follow up plan with the patient.   [place] Controlled Substance Database was reviewed by me.  This patient was seen by Mardy Maxin, FNP-C in collaboration with Dr. Sigrid Bathe as a part of collaborative care agreement.   Debra Easton R. Maxin, MSN, FNP-C Internal medicine

## 2024-03-08 ENCOUNTER — Telehealth: Payer: Self-pay

## 2024-03-08 ENCOUNTER — Encounter: Payer: Self-pay | Admitting: Nurse Practitioner

## 2024-03-08 NOTE — Telephone Encounter (Signed)
 Pt sent mychart message and dr Fernand already respond her we are unable to refills

## 2024-04-04 ENCOUNTER — Encounter: Payer: Self-pay | Admitting: Nurse Practitioner

## 2024-05-09 ENCOUNTER — Encounter: Payer: Self-pay | Admitting: Nurse Practitioner

## 2024-05-09 ENCOUNTER — Ambulatory Visit: Admitting: Nurse Practitioner

## 2024-05-09 VITALS — BP 110/80 | HR 87 | Temp 98.1°F | Ht 60.0 in | Wt 144.0 lb

## 2024-05-09 DIAGNOSIS — E0590 Thyrotoxicosis, unspecified without thyrotoxic crisis or storm: Secondary | ICD-10-CM | POA: Diagnosis not present

## 2024-05-09 DIAGNOSIS — E0500 Thyrotoxicosis with diffuse goiter without thyrotoxic crisis or storm: Secondary | ICD-10-CM | POA: Diagnosis not present

## 2024-05-09 DIAGNOSIS — F419 Anxiety disorder, unspecified: Secondary | ICD-10-CM | POA: Insufficient documentation

## 2024-05-09 DIAGNOSIS — F909 Attention-deficit hyperactivity disorder, unspecified type: Secondary | ICD-10-CM | POA: Diagnosis not present

## 2024-05-09 DIAGNOSIS — F32A Depression, unspecified: Secondary | ICD-10-CM | POA: Diagnosis not present

## 2024-05-09 DIAGNOSIS — Z7689 Persons encountering health services in other specified circumstances: Secondary | ICD-10-CM

## 2024-05-09 NOTE — Assessment & Plan Note (Signed)
 Brief review of EMR performed

## 2024-05-09 NOTE — Assessment & Plan Note (Signed)
 History of the same followed by telepsychiatry.  Patient maintained on fluoxetine 60 mg daily.  Continue following with specialist as recommended continue taking medication as prescribed.

## 2024-05-09 NOTE — Patient Instructions (Signed)
 Nice to see you today  I will be in touch once I have talked to your psychiatrist  Follow up with me in 3 months, sooner if you need me

## 2024-05-09 NOTE — Assessment & Plan Note (Signed)
 History of same followed by endocrinology.  Continue seeing specialist as recommended patient currently maintained on methimazole 

## 2024-05-09 NOTE — Assessment & Plan Note (Signed)
 History of same followed by endocrinology patient maintained on methimazole  5 mg daily.  Continue taking medication as prescribed continue following with specialist as recommended.

## 2024-05-09 NOTE — Progress Notes (Signed)
 New Patient Office Visit  Subjective    Patient ID: Debra Hinton, female    DOB: 1995/10/05  Age: 28 y.o. MRN: [id number]  CC:  Chief Complaint  Patient presents with   Establish Care    Pt complains of wanting medication management for ADHD.     HPI Debra Hinton presents to establish care   ADHD: she is seen by [facility] psychiatry and is followed every 2-3 months. States he wants her to go up to 20mg  daily. Last year she was dx through psychiatry  Dr. Marsa Hurl  Hyperthyroidism: Endocirne with [facility]. Patient is currently on Methimazole  5 mg daily   Mood: prozac 60mg  daily and does well with medications. She is followed by telemedicine psychiatry   Gyn: see [facility]. Seen with Dr A  Outpatient Encounter Medications as of 05/09/2024  Medication Sig   amphetamine -dextroamphetamine (ADDERALL XR) 15 MG 24 hr capsule Take 1 capsule by mouth every morning.   amphetamine -dextroamphetamine (ADDERALL XR) 15 MG 24 hr capsule Take 1 capsule by mouth every morning.   amphetamine -dextroamphetamine (ADDERALL XR) 15 MG 24 hr capsule Take 1 capsule by mouth every morning.   FLUoxetine (PROZAC) 20 MG capsule Take 20 mg by mouth daily.   FLUoxetine (PROZAC) 40 MG capsule Take 40 mg by mouth daily.   ibuprofen  (ADVIL ) 600 MG tablet Take 1 tablet (600 mg total) by mouth every 6 (six) hours.   methimazole  (TAPAZOLE ) 5 MG tablet Take 2 tablets (10 mg total) by mouth daily.   MY WAY 1.5 MG tablet Take by mouth.   norethindrone  (MICRONOR ) 0.35 MG tablet Take 1 tablet by mouth daily.   No facility-administered encounter medications on file as of 05/09/2024.    Past Medical History:  Diagnosis Date   Anxiety    Anxiety and depression    Depressive disorder    GERD (gastroesophageal reflux disease)    Gestational hypertension, third trimester    Hyperthyroidism 2019   Possible Graves disease   IBS (irritable bowel syndrome)    Irritable bowel syndrome (IBS)     Thyroid  disease     Past Surgical History:  Procedure Laterality Date   NO PAST SURGERIES      Family History  Problem Relation Age of Onset   Hypertension Mother    Thyroid  disease Father    Hyperlipidemia Father    Healthy Brother    Hypothyroidism Maternal Aunt    Hypothyroidism Maternal Aunt    Breast cancer Maternal Aunt        30s   Thyroid  disease Maternal Grandmother    Dementia Maternal Grandmother    Heart attack Maternal Grandfather    Diabetes Paternal Grandmother    Alcoholism Paternal Grandmother    Cancer Paternal Grandfather        pancreatic   Breast cancer Other         mggm    Social History   Socioeconomic History   Marital status: Married    Spouse name: Eva   Number of children: 4   Years of education: Not on file   Highest education level: Not on file  Occupational History   Not on file  Tobacco Use   Smoking status: Former    Current packs/day: 0.00    Types: Cigarettes    Quit date: 07/03/2018    Years since quitting: 5.8   Smokeless tobacco: Never  Vaping Use   Vaping status: Former  Substance and Sexual Activity   Alcohol use:  Not Currently   Drug use: Never    Types: Marijuana   Sexual activity: Yes    Birth control/protection: None  Other Topics Concern   Not on file  Social History Narrative   Stay at home mom       Richardean 9   [name] (5)   Milli(3)   Bryan (1)   Social Drivers of Health   Tobacco Use: Medium Risk (05/09/2024)   Patient History    Smoking Tobacco Use: Former    Smokeless Tobacco Use: Never    Passive Exposure: Not on Actuary Strain: Not on file  Food Insecurity: Not on file  Transportation Needs: Not on file  Physical Activity: Not on file  Stress: Not on file  Social Connections: Not on file  Intimate Partner Violence: Not on file  Depression (PHQ2-9): Medium Risk (05/09/2024)   Depression (PHQ2-9)    PHQ-2 Score: 5  Alcohol Screen: Not on file  Housing: Not on file   Utilities: Not on file  Health Literacy: Not on file    Review of Systems  Constitutional:  Negative for chills and fever.  Respiratory:  Negative for shortness of breath.   Cardiovascular:  Negative for chest pain.  Neurological:  Negative for headaches.  Psychiatric/Behavioral:  Negative for hallucinations and suicidal ideas.         Objective    BP 110/80   Pulse 87   Temp 98.1 F (36.7 C) (Oral)   Ht 5' (1.524 m)   Wt 144 lb (65.3 kg)   LMP 05/09/2024 Comment: currently on cycle  SpO2 99%   Breastfeeding No   BMI 28.12 kg/m   Physical Exam Vitals and nursing note reviewed.  Constitutional:      Appearance: Normal appearance.  HENT:     Right Ear: Tympanic membrane, ear canal and external ear normal.     Left Ear: Tympanic membrane, ear canal and external ear normal.     Mouth/Throat:     Mouth: Mucous membranes are moist.     Pharynx: Oropharynx is clear.  Eyes:     Extraocular Movements: Extraocular movements intact.     Pupils: Pupils are equal, round, and reactive to light.  Cardiovascular:     Rate and Rhythm: Normal rate and regular rhythm.     Pulses: Normal pulses.     Heart sounds: Normal heart sounds.  Pulmonary:     Effort: Pulmonary effort is normal.     Breath sounds: Normal breath sounds.  Musculoskeletal:     Right lower leg: No edema.     Left lower leg: No edema.  Lymphadenopathy:     Cervical: No cervical adenopathy.  Skin:    General: Skin is warm.  Neurological:     General: No focal deficit present.     Mental Status: She is alert.     Deep Tendon Reflexes:     Reflex Scores:      Bicep reflexes are 2+ on the right side and 2+ on the left side.      Patellar reflexes are 2+ on the right side and 2+ on the left side.    Comments: Bilateral upper and lower extremity strength 5/5  Psychiatric:        Mood and Affect: Mood normal.        Behavior: Behavior normal.        Thought Content: Thought content normal.        Judgment:  Judgment normal.  Assessment & Plan:   Problem List Items Addressed This Visit       Endocrine   Hyperthyroidism   History of same followed by endocrinology patient maintained on methimazole  5 mg daily.  Continue taking medication as prescribed continue following with specialist as recommended.      Graves disease   History of same followed by endocrinology.  Continue seeing specialist as recommended patient currently maintained on methimazole         Other   Encounter to establish care - Primary   Brief review of EMR performed      Attention deficit hyperactivity disorder (ADHD)   History of diagnosis through telepsychiatry.  Patient is followed by Dr. Marsa Hurl.  PCP manages Adderall prescription      Relevant Orders   DRUG MONITORING, PANEL 8 WITH CONFIRMATION, URINE   Anxiety and depression   History of the same followed by telepsychiatry.  Patient maintained on fluoxetine 60 mg daily.  Continue following with specialist as recommended continue taking medication as prescribed.      Relevant Medications   FLUoxetine (PROZAC) 20 MG capsule    Return in about 3 months (around 08/07/2024) for ADHD med recheck .   Adina Crandall, NP

## 2024-05-09 NOTE — Assessment & Plan Note (Signed)
 History of diagnosis through telepsychiatry.  Patient is followed by Dr. Marsa Hurl.  PCP manages Adderall prescription

## 2024-05-13 LAB — DRUG MONITORING, PANEL 8 WITH CONFIRMATION, URINE
6 Acetylmorphine: NEGATIVE ng/mL
Alcohol Metabolites: NEGATIVE ng/mL
Amphetamine: 1056 ng/mL — ABNORMAL HIGH
Amphetamines: POSITIVE ng/mL — AB
Benzodiazepines: NEGATIVE ng/mL
Buprenorphine, Urine: NEGATIVE ng/mL
Cocaine Metabolite: NEGATIVE ng/mL
Creatinine: 132.4 mg/dL
MDMA: NEGATIVE ng/mL
Marijuana Metabolite: 2276 ng/mL — ABNORMAL HIGH
Marijuana Metabolite: POSITIVE ng/mL — AB
Methamphetamine: NEGATIVE ng/mL
Opiates: NEGATIVE ng/mL
Oxidant: NEGATIVE ug/mL
Oxycodone: NEGATIVE ng/mL
pH: 7.5 (ref 4.5–9.0)

## 2024-05-13 LAB — DM TEMPLATE

## 2024-05-15 ENCOUNTER — Ambulatory Visit: Payer: Self-pay | Admitting: Nurse Practitioner

## 2024-05-23 ENCOUNTER — Telehealth: Payer: Self-pay | Admitting: Nurse Practitioner

## 2024-05-23 ENCOUNTER — Telehealth: Payer: Self-pay

## 2024-05-23 NOTE — Telephone Encounter (Signed)
 Patient called asking for weight loss medication, stated that her pharmacy told her to call us  to get her remaining weight loss medication, patient was told as per Alyssa she isn't our patient anymore and we couldn't refill it and patient hung up.

## 2024-05-23 NOTE — Telephone Encounter (Signed)
 Copied from CRM #[id number]. Topic: Clinical - Prescription Issue >> May 23, 2024  9:22 AM Wess RAMAN wrote: Reason for CRM: Patient was on Wegovy  with her previous doctor and would like Wendee Agent, NP to send in a new prescription  Callback #: [id number]  Pharmacy: [facility] [address]), [place] - [address]) [place] [address] Phone: [phone] Fax: [phone] Hours: Not open 24 hours

## 2024-05-28 ENCOUNTER — Telehealth: Payer: Self-pay

## 2024-05-28 DIAGNOSIS — F909 Attention-deficit hyperactivity disorder, unspecified type: Secondary | ICD-10-CM

## 2024-05-28 NOTE — Telephone Encounter (Signed)
 Copied from CRM [id number]. Topic: Clinical - Medication Question >> May 28, 2024  1:34 PM Emylou G wrote: Reason for CRM: Patient called.. checking status of amphetamine -dextroamphetamine (ADDERALL XR) 15 MG 24 hr capsule.. to increase to 20mg .. before she rounds out this weekend.  Pls review / call back patient on update

## 2024-05-30 MED ORDER — AMPHETAMINE-DEXTROAMPHET ER 20 MG PO CP24: 20.0000 mg | ORAL_CAPSULE | ORAL | 0 refills | Status: DC

## 2024-05-30 MED ORDER — AMPHETAMINE-DEXTROAMPHET ER 20 MG PO CP24: 20.0000 mg | ORAL_CAPSULE | ORAL | 0 refills | Status: AC

## 2024-05-30 NOTE — Addendum Note (Signed)
 Addended by: WENDEE LYNWOOD HERO on: 05/30/2024 08:03 AM   Modules accepted: Orders

## 2024-05-30 NOTE — Telephone Encounter (Signed)
 I did call and spoke with Dr. Hershal. I will increase the adderall to 20mg  XR once daily. New script has been sent to the pharmacy

## 2024-05-30 NOTE — Telephone Encounter (Signed)
 Called pt and relayed information.  Pt states that she has already picked up script from pharmacy. No questions or concerns.

## 2024-06-13 ENCOUNTER — Ambulatory Visit: Admitting: Nurse Practitioner

## 2024-06-29 ENCOUNTER — Telehealth: Payer: Self-pay

## 2024-06-29 DIAGNOSIS — F909 Attention-deficit hyperactivity disorder, unspecified type: Secondary | ICD-10-CM

## 2024-06-29 MED ORDER — AMPHETAMINE-DEXTROAMPHET ER 20 MG PO CP24: 20.0000 mg | ORAL_CAPSULE | ORAL | 0 refills | Status: AC

## 2024-06-29 NOTE — Addendum Note (Signed)
 Addended by: WENDEE LYNWOOD HERO on: 06/29/2024 12:35 PM   Modules accepted: Orders

## 2024-06-29 NOTE — Telephone Encounter (Signed)
 New scripts sent in

## 2024-06-29 NOTE — Telephone Encounter (Signed)
 Refills have already been sent for feb and march 2026. Patient needs to call pharmacy for refills. Called patient and notified her of this and patient stated that she already called and they did not have her refills on file. I advised that I would call [facility] and see what's going on with the prescriptions.   I called [facility] to see if they had the previous prescriptions on file for this patient. After some searching she was able to see them but for some reason were cancelled out and tech wasn't sure why they were cancelled out. New rxs will need to be sent in for patient.

## 2024-06-29 NOTE — Telephone Encounter (Signed)
 Copied from CRM #[id number]. Topic: Clinical - Medication Refill >> Jun 29, 2024 11:24 AM Leah C wrote: Medication: amphetamine -dextroamphetamine (ADDERALL XR) 20 MG 24 hr capsule  Has the patient contacted their pharmacy? Yes told to contact clinic (Agent: If no, request that the patient contact the pharmacy for the refill. If patient does not wish to contact the pharmacy document the reason why and proceed with request.) (Agent: If yes, when and what did the pharmacy advise?)  This is the patient's preferred pharmacy:  [facility] [address] ([address]), [place] - [address] [place]) [place] [address] Phone: [phone] Fax: [phone]  Is this the correct pharmacy for this prescription? yes If no, delete pharmacy and type the correct one.   Has the prescription been filled recently? yes  Is the patient out of the medication? 4 or 5 left   Has the patient been seen for an appointment in the last year OR does the patient have an upcoming appointment? Yes March 6th and 17th and saw last December   Can we respond through MyChart? Yes   Agent: Please be advised that Rx refills may take up to 3 business days. We ask that you follow-up with your pharmacy.

## 2024-07-27 ENCOUNTER — Ambulatory Visit: Admitting: Nurse Practitioner

## 2024-08-07 ENCOUNTER — Ambulatory Visit: Admitting: Nurse Practitioner

## 2024-08-16 ENCOUNTER — Encounter: Admitting: Nurse Practitioner
